# Patient Record
Sex: Female | Born: 1990 | Race: Black or African American | Hispanic: No | Marital: Single | Smoking: Former smoker
Health system: Southern US, Community
[De-identification: ages and names within clinical notes are randomized; demographics above are authoritative.]

## PROBLEM LIST (undated history)

## (undated) ENCOUNTER — Inpatient Hospital Stay (HOSPITAL_COMMUNITY): Payer: Self-pay

## (undated) ENCOUNTER — Emergency Department (HOSPITAL_COMMUNITY)
Admission: EM | Disposition: A | Discharge status: Other Acute Inpatient Hospital | Payer: No Typology Code available for payment source

## (undated) DIAGNOSIS — B999 Unspecified infectious disease: Secondary | ICD-10-CM

## (undated) DIAGNOSIS — Z8614 Personal history of Methicillin resistant Staphylococcus aureus infection: Secondary | ICD-10-CM

## (undated) DIAGNOSIS — F32A Depression, unspecified: Secondary | ICD-10-CM

## (undated) DIAGNOSIS — D573 Sickle-cell trait: Secondary | ICD-10-CM

## (undated) DIAGNOSIS — A749 Chlamydial infection, unspecified: Secondary | ICD-10-CM

## (undated) DIAGNOSIS — I10 Essential (primary) hypertension: Secondary | ICD-10-CM

## (undated) DIAGNOSIS — F329 Major depressive disorder, single episode, unspecified: Secondary | ICD-10-CM

## (undated) HISTORY — DX: Sickle-cell trait: D57.3

## (undated) HISTORY — DX: Personal history of Methicillin resistant Staphylococcus aureus infection: Z86.14

---
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
## 2010-08-30 ENCOUNTER — Emergency Department (HOSPITAL_COMMUNITY): Admission: EM | Admit: 2010-08-30 | Discharge: 2010-08-30 | Payer: Self-pay | Admitting: Emergency Medicine

## 2010-10-10 NOTE — L&D Delivery Note (Signed)
Delivery Note  Pt arrived via EMS with urge to push. I was called to [facility], when I arrived baby was delivered, ARNP was in room.   Infant had spontaneous respirations and was vigorous. There was light mec staining  At 10:07 AM a viable female was delivered via Vaginal, Spontaneous Delivery .  APGAR: 9, 9; weight .unkown at this time.  Placenta status: Intact, Spontaneous, Tomasa Blase, was mec stained, routine cord blood was collected.   Anesthesia: None  Episiotomy: none Lacerations: none Suture Repair: n/a Est. Blood Loss (mL):   Mom to postpartum.  Baby to nursery-stable.  Dr. Stefano Gaul notified  Sanda Klein M 07/14/2011, 12:08 PM

## 2010-12-04 ENCOUNTER — Inpatient Hospital Stay (HOSPITAL_COMMUNITY): Payer: Medicaid Other

## 2010-12-04 ENCOUNTER — Inpatient Hospital Stay (HOSPITAL_COMMUNITY)
Admission: AD | Admit: 2010-12-04 | Discharge: 2010-12-04 | Disposition: A | Discharge status: Home / Self Care | Payer: Medicaid Other | Source: Ambulatory Visit | Attending: Obstetrics & Gynecology | Admitting: Obstetrics & Gynecology

## 2010-12-04 DIAGNOSIS — R109 Unspecified abdominal pain: Secondary | ICD-10-CM | POA: Insufficient documentation

## 2010-12-04 DIAGNOSIS — O210 Mild hyperemesis gravidarum: Secondary | ICD-10-CM | POA: Insufficient documentation

## 2010-12-04 LAB — URINE MICROSCOPIC-ADD ON

## 2010-12-04 LAB — POCT PREGNANCY, URINE: Preg Test, Ur: POSITIVE

## 2010-12-04 LAB — URINALYSIS, ROUTINE W REFLEX MICROSCOPIC
Nitrite: NEGATIVE
Specific Gravity, Urine: 1.02 (ref 1.005–1.030)
pH: 6 (ref 5.0–8.0)

## 2010-12-04 LAB — CBC
MCH: 29.6 pg (ref 26.0–34.0)
MCHC: 34.8 g/dL (ref 30.0–36.0)
Platelets: 179 10*3/uL (ref 150–400)
RDW: 12.8 % (ref 11.5–15.5)

## 2010-12-04 LAB — HCG, QUANTITATIVE, PREGNANCY: hCG, Beta Chain, Quant, S: 42814 m[IU]/mL — ABNORMAL HIGH (ref <5)

## 2010-12-04 LAB — WET PREP, GENITAL

## 2010-12-06 LAB — GC/CHLAMYDIA PROBE AMP, GENITAL: GC Probe Amp, Genital: NEGATIVE

## 2010-12-06 LAB — URINE CULTURE: Culture  Setup Time: 201202260240

## 2010-12-26 LAB — CBC
Hemoglobin: 11.1 g/dL — ABNORMAL LOW (ref 12.0–15.0)
MCHC: 33.3 g/dL (ref 30.0–36.0)
MCHC: 33.7 g/dL (ref 30.0–36.0)
MCV: 90.2 fL (ref 78.0–100.0)
Platelets: 167 10*3/uL (ref 150–400)
Platelets: 184 10*3/uL (ref 150–400)
RDW: 13.1 % (ref 11.5–15.5)
RDW: 13.2 % (ref 11.5–15.5)

## 2010-12-26 LAB — COMPREHENSIVE METABOLIC PANEL
ALT: 13 U/L (ref 0–35)
Calcium: 8.9 mg/dL (ref 8.4–10.5)
Glucose, Bld: 92 mg/dL (ref 70–99)
Sodium: 134 mEq/L — ABNORMAL LOW (ref 135–145)
Total Protein: 6.7 g/dL (ref 6.0–8.3)

## 2010-12-26 LAB — URINALYSIS, ROUTINE W REFLEX MICROSCOPIC
Nitrite: NEGATIVE
Specific Gravity, Urine: 1.01 (ref 1.005–1.030)
Urobilinogen, UA: 0.2 mg/dL (ref 0.0–1.0)

## 2010-12-26 LAB — RPR: RPR Ser Ql: NONREACTIVE

## 2010-12-26 LAB — LACTATE DEHYDROGENASE: LDH: 112 U/L (ref 94–250)

## 2010-12-26 LAB — URINE MICROSCOPIC-ADD ON

## 2010-12-26 LAB — URIC ACID: Uric Acid, Serum: 5.3 mg/dL (ref 2.4–7.0)

## 2011-01-05 ENCOUNTER — Emergency Department (HOSPITAL_COMMUNITY)
Admission: EM | Admit: 2011-01-05 | Discharge: 2011-01-05 | Disposition: A | Discharge status: Home / Self Care | Payer: No Typology Code available for payment source | Attending: Emergency Medicine | Admitting: Emergency Medicine

## 2011-01-05 ENCOUNTER — Inpatient Hospital Stay (INDEPENDENT_AMBULATORY_CARE_PROVIDER_SITE_OTHER)
Admission: RE | Admit: 2011-01-05 | Discharge: 2011-01-05 | Disposition: A | Discharge status: Home / Self Care | Payer: Self-pay | Source: Ambulatory Visit | Attending: Emergency Medicine | Admitting: Emergency Medicine

## 2011-01-05 DIAGNOSIS — M549 Dorsalgia, unspecified: Secondary | ICD-10-CM | POA: Insufficient documentation

## 2011-01-05 DIAGNOSIS — M542 Cervicalgia: Secondary | ICD-10-CM | POA: Insufficient documentation

## 2011-01-05 DIAGNOSIS — Z331 Pregnant state, incidental: Secondary | ICD-10-CM

## 2011-01-05 DIAGNOSIS — R51 Headache: Secondary | ICD-10-CM | POA: Insufficient documentation

## 2011-01-05 DIAGNOSIS — R10819 Abdominal tenderness, unspecified site: Secondary | ICD-10-CM | POA: Insufficient documentation

## 2011-01-05 DIAGNOSIS — O99891 Other specified diseases and conditions complicating pregnancy: Secondary | ICD-10-CM | POA: Insufficient documentation

## 2011-01-05 DIAGNOSIS — R109 Unspecified abdominal pain: Secondary | ICD-10-CM | POA: Insufficient documentation

## 2011-01-05 LAB — POCT URINALYSIS DIP (DEVICE)
Bilirubin Urine: NEGATIVE
Glucose, UA: NEGATIVE mg/dL
Nitrite: NEGATIVE
Urobilinogen, UA: 1 mg/dL (ref 0.0–1.0)
pH: 7.5 (ref 5.0–8.0)

## 2011-01-14 LAB — URINALYSIS, ROUTINE W REFLEX MICROSCOPIC
Hgb urine dipstick: NEGATIVE
Ketones, ur: NEGATIVE mg/dL
Protein, ur: NEGATIVE mg/dL
Urobilinogen, UA: 0.2 mg/dL (ref 0.0–1.0)

## 2011-01-14 LAB — WET PREP, GENITAL: Yeast Wet Prep HPF POC: NONE SEEN

## 2011-01-14 LAB — GC/CHLAMYDIA PROBE AMP, GENITAL: GC Probe Amp, Genital: NEGATIVE

## 2011-01-14 LAB — FETAL FIBRONECTIN: Fetal Fibronectin: NEGATIVE

## 2011-01-16 LAB — POCT URINALYSIS DIP (DEVICE)
Glucose, UA: NEGATIVE mg/dL
Hgb urine dipstick: NEGATIVE
Nitrite: NEGATIVE
Protein, ur: NEGATIVE mg/dL
Specific Gravity, Urine: 1.02 (ref 1.005–1.030)
Urobilinogen, UA: 1 mg/dL (ref 0.0–1.0)
pH: 6.5 (ref 5.0–8.0)

## 2011-01-16 LAB — URINALYSIS, ROUTINE W REFLEX MICROSCOPIC
Bilirubin Urine: NEGATIVE
Glucose, UA: NEGATIVE mg/dL
Specific Gravity, Urine: 1.015 (ref 1.005–1.030)
Urobilinogen, UA: 1 mg/dL (ref 0.0–1.0)
pH: 6.5 (ref 5.0–8.0)

## 2011-01-16 LAB — URINE MICROSCOPIC-ADD ON

## 2011-01-16 LAB — URINE CULTURE

## 2011-01-16 LAB — GC/CHLAMYDIA PROBE AMP, GENITAL: GC Probe Amp, Genital: NEGATIVE

## 2011-01-16 LAB — WET PREP, GENITAL
Trich, Wet Prep: NONE SEEN
Yeast Wet Prep HPF POC: NONE SEEN

## 2011-01-17 LAB — URINALYSIS, ROUTINE W REFLEX MICROSCOPIC
Glucose, UA: NEGATIVE mg/dL
Hgb urine dipstick: NEGATIVE
Ketones, ur: NEGATIVE mg/dL
Leukocytes, UA: NEGATIVE
Protein, ur: 30 mg/dL — AB
pH: 7 (ref 5.0–8.0)

## 2011-01-17 LAB — WET PREP, GENITAL
Clue Cells Wet Prep HPF POC: NONE SEEN
Yeast Wet Prep HPF POC: NONE SEEN

## 2011-01-17 LAB — GC/CHLAMYDIA PROBE AMP, GENITAL
Chlamydia, DNA Probe: NEGATIVE
GC Probe Amp, Genital: NEGATIVE

## 2011-01-17 LAB — URINE MICROSCOPIC-ADD ON

## 2011-03-05 ENCOUNTER — Inpatient Hospital Stay (HOSPITAL_COMMUNITY)
Admission: AD | Admit: 2011-03-05 | Discharge: 2011-03-05 | Disposition: A | Discharge status: Home / Self Care | Payer: Medicaid Other | Source: Ambulatory Visit | Attending: Obstetrics and Gynecology | Admitting: Obstetrics and Gynecology

## 2011-03-05 DIAGNOSIS — R109 Unspecified abdominal pain: Secondary | ICD-10-CM | POA: Insufficient documentation

## 2011-03-05 DIAGNOSIS — O99891 Other specified diseases and conditions complicating pregnancy: Secondary | ICD-10-CM | POA: Insufficient documentation

## 2011-03-05 LAB — URINALYSIS, ROUTINE W REFLEX MICROSCOPIC
Glucose, UA: NEGATIVE mg/dL
Hgb urine dipstick: NEGATIVE
pH: 6.5 (ref 5.0–8.0)

## 2011-03-05 LAB — URINE MICROSCOPIC-ADD ON

## 2011-03-06 LAB — URINE CULTURE: Culture: NO GROWTH

## 2011-04-12 ENCOUNTER — Inpatient Hospital Stay (HOSPITAL_COMMUNITY)
Admission: AD | Admit: 2011-04-12 | Discharge: 2011-04-12 | Disposition: A | Discharge status: Home / Self Care | Payer: Medicaid Other | Source: Ambulatory Visit | Attending: Obstetrics and Gynecology | Admitting: Obstetrics and Gynecology

## 2011-04-12 DIAGNOSIS — O212 Late vomiting of pregnancy: Secondary | ICD-10-CM | POA: Insufficient documentation

## 2011-04-12 LAB — CBC
HCT: 32.4 % — ABNORMAL LOW (ref 36.0–46.0)
Hemoglobin: 11.1 g/dL — ABNORMAL LOW (ref 12.0–15.0)
MCH: 30.1 pg (ref 26.0–34.0)
MCV: 87.8 fL (ref 78.0–100.0)
RBC: 3.69 MIL/uL — ABNORMAL LOW (ref 3.87–5.11)

## 2011-04-12 LAB — COMPREHENSIVE METABOLIC PANEL
ALT: 16 U/L (ref 0–35)
BUN: 5 mg/dL — ABNORMAL LOW (ref 6–23)
CO2: 25 mEq/L (ref 19–32)
Calcium: 9.7 mg/dL (ref 8.4–10.5)
Creatinine, Ser: 0.65 mg/dL (ref 0.50–1.10)
GFR calc Af Amer: >60 mL/min (ref >60)
GFR calc non Af Amer: >60 mL/min (ref >60)
Glucose, Bld: 87 mg/dL (ref 70–99)

## 2011-04-25 ENCOUNTER — Encounter (HOSPITAL_COMMUNITY): Payer: Self-pay

## 2011-04-25 ENCOUNTER — Inpatient Hospital Stay (HOSPITAL_COMMUNITY)
Admission: AD | Admit: 2011-04-25 | Discharge: 2011-04-25 | Disposition: A | Discharge status: Home / Self Care | Payer: Medicaid Other | Source: Ambulatory Visit | Attending: Obstetrics and Gynecology | Admitting: Obstetrics and Gynecology

## 2011-04-25 DIAGNOSIS — R110 Nausea: Secondary | ICD-10-CM

## 2011-04-25 DIAGNOSIS — O210 Mild hyperemesis gravidarum: Secondary | ICD-10-CM | POA: Insufficient documentation

## 2011-04-25 HISTORY — DX: Unspecified infectious disease: B99.9

## 2011-04-25 HISTORY — DX: Depression, unspecified: F32.A

## 2011-04-25 HISTORY — DX: Major depressive disorder, single episode, unspecified: F32.9

## 2011-04-25 LAB — URINALYSIS, ROUTINE W REFLEX MICROSCOPIC
Bilirubin Urine: NEGATIVE
Glucose, UA: NEGATIVE mg/dL
Ketones, ur: 15 mg/dL — AB
Nitrite: NEGATIVE
Specific Gravity, Urine: 1.01 (ref 1.005–1.030)
pH: 6.5 (ref 5.0–8.0)

## 2011-04-25 LAB — URINE MICROSCOPIC-ADD ON

## 2011-04-25 MED ORDER — DEXTROSE IN LACTATED RINGERS 5 % IV SOLN
Freq: Once | INTRAVENOUS | Status: AC
  Administered 2011-04-25: 17:00:00 via INTRAVENOUS

## 2011-04-25 MED ORDER — ONDANSETRON HCL 4 MG/2ML IJ SOLN
4.0000 mg | Freq: Once | INTRAMUSCULAR | Status: AC
  Administered 2011-04-25: 4 mg via INTRAVENOUS
  Filled 2011-04-25: qty 2

## 2011-04-25 MED ORDER — ONDANSETRON HCL 4 MG/2ML IJ SOLN: 4.0000 mg | Freq: Once | INTRAMUSCULAR | Status: DC

## 2011-04-25 MED ORDER — LACTATED RINGERS IV SOLN
Freq: Once | INTRAVENOUS | Status: AC
  Administered 2011-04-25: 17:00:00 via INTRAVENOUS

## 2011-04-25 NOTE — ED Provider Notes (Signed)
Alexandria Fisher is a 20 y.o. female presenting for N/V States has had N/V off and on past 2 weeks. Stopped using Zofran d/t constipation. States Zofran was helping her nausea prior to discontinuing it.  No episodes of vomiting today. Desires MRSA culture today to determine if she is still pos  Maternal Medical History:  Reason for admission: Reason for admission: nausea (Nausea x past 2 weeks).  Fetal activity: Perceived fetal activity is normal.   Last perceived fetal movement was within the past hour.    Prenatal complications: No bleeding, cholelithiasis, HIV, hypertension, infection, IUGR, nephrolithiasis, oligohydramnios, placental abnormality, polyhydramnios, pre-eclampsia, preterm labor, substance abuse, thrombocytopenia or thrombophilia.   Prenatal Complications - Diabetes: none.   Hx MRSA OB History    Grav Para Term Preterm Abortions TAB SAB Ect Mult Living   2 1 1       1      Past Medical History  Diagnosis Date  . Infection   . Depression    Past Surgical History  Procedure Date  . No past surgeries    Family History: family history is not on file. Social History:  reports that she quit smoking about a year ago. She does not have any smokeless tobacco history on file. She reports that she does not drink alcohol or use illicit drugs.  Review of Systems  Constitutional: Negative.   HENT: Negative.   Eyes: Negative.   Respiratory: Negative.   Cardiovascular: Negative.   Gastrointestinal: Positive for nausea (Nausea x past 2 weeks) and abdominal pain (Sharp pains in abd. approx 2 x/hr lasting "a few seconds"). Negative for heartburn, vomiting, diarrhea, blood in stool and melena.  Genitourinary: Negative.   Musculoskeletal: Negative.   Skin: Negative.   Neurological: Negative.   Endo/Heme/Allergies: Negative.   Psychiatric/Behavioral: Negative.       Blood pressure 121/65, pulse 89, temperature 98.4 F (36.9 C), temperature source Oral, resp. rate 16, height  5\' 5"  (1.651 m), weight 93.895 kg (207 lb), SpO2 98.00%. Maternal Exam:  Abdomen: Fundal height is AGA.       Fetal Exam Fetal Monitor Review: Mode: ultrasound.   Baseline rate: 140.  Variability: moderate (6-25 bpm).   Pattern: accelerations present and no decelerations.    Fetal State Assessment: Category I - tracings are normal.     Physical Exam  Constitutional: She is oriented to person, place, and time. She appears well-developed and well-nourished. No distress.  HENT:  Head: Normocephalic.  GI: Soft. She exhibits no distension and no mass. There is no tenderness. There is no rebound and no guarding.  Musculoskeletal: Normal range of motion.  Neurological: She is alert and oriented to person, place, and time.  Skin: Skin is warm and dry.  Psychiatric: She has a normal mood and affect. Her behavior is normal. Judgment and thought content normal.    Prenatal labs: ABO, Rh:   Antibody:   Rubella:   RPR:    HBsAg:    HIV:    GBS:     Assessment/Plan: 1. Nausea  Plan: CCUA IV LR 1 L bolus Zofran 4 mg IVP   Anabel Halon 04/25/2011, 4:22 PM

## 2011-04-25 NOTE — Progress Notes (Signed)
Pt states she was seen last week in MAU for N/V/D and given IV fluids. Pt states she continues to have N/V, no diarrhea. Has been having low abdominal and R side of her back pain. No leaking or bleeding. Reports good fetal movement.

## 2011-04-25 NOTE — Progress Notes (Signed)
S: States feels much better and desires d/c home O: no vomiting noted, VSS FHR  140s Cat I, no decels Toco: No UC's, UI A: N/V now resolved P: D/C home F/U @ office

## 2011-04-25 NOTE — Progress Notes (Signed)
Pt c/o nausea that continues.  She says she stopped taking zofran b/c of contipation, although it worked .  She was encouraged to continue to take the zofran but add colace and miralax to her regimine daily.

## 2011-04-28 LAB — MRSA CULTURE

## 2011-07-07 LAB — CULTURE, ROUTINE-ABSCESS

## 2011-07-14 ENCOUNTER — Encounter (HOSPITAL_COMMUNITY): Payer: Self-pay | Admitting: *Deleted

## 2011-07-14 ENCOUNTER — Inpatient Hospital Stay (HOSPITAL_COMMUNITY)
Admission: AD | Admit: 2011-07-14 | Discharge: 2011-07-16 | DRG: 775 | Disposition: A | Discharge status: Home / Self Care | Payer: Medicaid Other | Source: Ambulatory Visit | Attending: Obstetrics and Gynecology | Admitting: Obstetrics and Gynecology

## 2011-07-14 DIAGNOSIS — Z8614 Personal history of Methicillin resistant Staphylococcus aureus infection: Secondary | ICD-10-CM | POA: Diagnosis present

## 2011-07-14 DIAGNOSIS — O99892 Other specified diseases and conditions complicating childbirth: Principal | ICD-10-CM | POA: Diagnosis present

## 2011-07-14 DIAGNOSIS — Z22330 Carrier of Group B streptococcus: Secondary | ICD-10-CM

## 2011-07-14 DIAGNOSIS — O9902 Anemia complicating childbirth: Secondary | ICD-10-CM | POA: Diagnosis present

## 2011-07-14 DIAGNOSIS — D573 Sickle-cell trait: Secondary | ICD-10-CM | POA: Diagnosis present

## 2011-07-14 HISTORY — DX: Personal history of Methicillin resistant Staphylococcus aureus infection: Z86.14

## 2011-07-14 MED ORDER — OXYCODONE-ACETAMINOPHEN 5-325 MG PO TABS
1.0000 | ORAL_TABLET | ORAL | Status: DC | PRN
  Administered 2011-07-14 – 2011-07-15 (×3): 1 via ORAL
  Filled 2011-07-14: qty 1

## 2011-07-14 MED ORDER — ONDANSETRON HCL 4 MG PO TABS: 4.0000 mg | ORAL_TABLET | ORAL | Status: DC | PRN

## 2011-07-14 MED ORDER — CITRIC ACID-SODIUM CITRATE 334-500 MG/5ML PO SOLN: 30.0000 mL | ORAL | Status: DC | PRN

## 2011-07-14 MED ORDER — BENZOCAINE-MENTHOL 20-0.5 % EX AERO
1.0000 "application " | INHALATION_SPRAY | CUTANEOUS | Status: DC | PRN
  Administered 2011-07-14: 1 via TOPICAL

## 2011-07-14 MED ORDER — ONDANSETRON HCL 4 MG/2ML IJ SOLN: 4.0000 mg | INTRAMUSCULAR | Status: DC | PRN

## 2011-07-14 MED ORDER — LANOLIN HYDROUS EX OINT: TOPICAL_OINTMENT | CUTANEOUS | Status: DC | PRN

## 2011-07-14 MED ORDER — ACETAMINOPHEN 325 MG PO TABS: 650.0000 mg | ORAL_TABLET | ORAL | Status: DC | PRN

## 2011-07-14 MED ORDER — BENZOCAINE-MENTHOL 20-0.5 % EX AERO
INHALATION_SPRAY | CUTANEOUS | Status: AC
  Filled 2011-07-14: qty 56

## 2011-07-14 MED ORDER — DIPHENHYDRAMINE HCL 25 MG PO CAPS: 25.0000 mg | ORAL_CAPSULE | Freq: Four times a day (QID) | ORAL | Status: DC | PRN

## 2011-07-14 MED ORDER — IBUPROFEN 600 MG PO TABS
600.0000 mg | ORAL_TABLET | Freq: Four times a day (QID) | ORAL | Status: DC
  Administered 2011-07-14 – 2011-07-16 (×6): 600 mg via ORAL
  Filled 2011-07-14 (×2): qty 1

## 2011-07-14 MED ORDER — SENNOSIDES-DOCUSATE SODIUM 8.6-50 MG PO TABS
2.0000 | ORAL_TABLET | Freq: Every day | ORAL | Status: DC
  Administered 2011-07-15: 2 via ORAL

## 2011-07-14 MED ORDER — PRENATAL PLUS 27-1 MG PO TABS
1.0000 | ORAL_TABLET | Freq: Every day | ORAL | Status: DC
  Administered 2011-07-14 – 2011-07-16 (×3): 1 via ORAL
  Filled 2011-07-14 (×3): qty 1

## 2011-07-14 MED ORDER — OXYCODONE-ACETAMINOPHEN 5-325 MG PO TABS
2.0000 | ORAL_TABLET | ORAL | Status: DC | PRN
  Filled 2011-07-14 (×2): qty 1

## 2011-07-14 MED ORDER — WITCH HAZEL-GLYCERIN EX PADS: 1.0000 "application " | MEDICATED_PAD | CUTANEOUS | Status: DC | PRN

## 2011-07-14 MED ORDER — DIBUCAINE 1 % RE OINT: 1.0000 "application " | TOPICAL_OINTMENT | RECTAL | Status: DC | PRN

## 2011-07-14 MED ORDER — TETANUS-DIPHTH-ACELL PERTUSSIS 5-2.5-18.5 LF-MCG/0.5 IM SUSP
0.5000 mL | Freq: Once | INTRAMUSCULAR | Status: AC
  Administered 2011-07-15: 0.5 mL via INTRAMUSCULAR
  Filled 2011-07-14: qty 0.5

## 2011-07-14 MED ORDER — MEASLES, MUMPS & RUBELLA VAC ~~LOC~~ INJ
0.5000 mL | INJECTION | Freq: Once | SUBCUTANEOUS | Status: DC
  Filled 2011-07-14: qty 0.5

## 2011-07-14 MED ORDER — IBUPROFEN 600 MG PO TABS
600.0000 mg | ORAL_TABLET | Freq: Four times a day (QID) | ORAL | Status: DC | PRN
  Administered 2011-07-15 – 2011-07-16 (×2): 600 mg via ORAL
  Filled 2011-07-14 (×6): qty 1

## 2011-07-14 MED ORDER — ZOLPIDEM TARTRATE 5 MG PO TABS: 5.0000 mg | ORAL_TABLET | Freq: Every evening | ORAL | Status: DC | PRN

## 2011-07-14 MED ORDER — SIMETHICONE 80 MG PO CHEW: 80.0000 mg | CHEWABLE_TABLET | ORAL | Status: DC | PRN

## 2011-07-14 MED ORDER — ONDANSETRON HCL 4 MG/2ML IJ SOLN: 4.0000 mg | Freq: Four times a day (QID) | INTRAMUSCULAR | Status: DC | PRN

## 2011-07-14 NOTE — Progress Notes (Signed)
BOW -SROM. Light meconium noted. Pt pushing. Marland Kitchen Spontaneous delivery of viable female assited by D.Coley,RN and K.Claudie Brickhouse,RN. Provider not at bedside.

## 2011-07-14 NOTE — Progress Notes (Signed)
Pt came in via EMS . Very uncomfortable stated hse started feling ctx around 4am. Presently feels the urge to push. Pt placed on  Bed. And Bag of wateres bulgding from vagina.  Midwife called.

## 2011-07-14 NOTE — H&P (Signed)
Alexandria Fisher is a 20 y.o. female presenting for urge to push, SROM and then precipitous SVD in MAU, ARNP was in room.  Pt began care at [facility] at 18wks. Initial EDC based on 6wk [place] was 10-22, Pt's LMP is 10/24/10. Which gives an EDC of 10/15. Pt was followed by the CNM's and MD's.  Pregnancy significant for: 1. Depression 2. LTC at 18wks - EDC based on 18wk [place] 3. 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 Closely spaced pregnancies 6. GERD 7. +GBS    Maternal Medical History:  Reason for admission: Reason for admission: contractions.  Contractions: Onset was 6-12 hours ago.   Frequency: regular.      OB History    Grav Para Term Preterm Abortions TAB SAB Ect Mult Living   2 1 1       1      Past Medical History  Diagnosis Date  . Infection   . Depression    Past Surgical History  Procedure Date  . No past surgeries    Family History: family history is not on file. CHTN in [facility] and [facility], Renal dx in [facility], Brst CA in [facility] Social History:  reports that she quit smoking about 14 months ago. She does not have any smokeless tobacco history on file. She reports that she does not drink alcohol or use illicit drugs.  Pt is a SBF, unemployed with 10th grade education and does not list FOB, she does not have religious preference. She did have some depression during this pregnancy and was started on zoloft 50mg . Has denied SI/HI  Review of Systems  All other systems reviewed and are negative.      Temperature 97.7 F (36.5 C), temperature source Oral, resp. rate 18. Exam Physical Exam  Nursing note and vitals reviewed. Constitutional: She is oriented to person, place, and time. She appears well-developed and well-nourished.  Cardiovascular: Normal rate.   Respiratory: Effort normal.  GI: Soft.  Genitourinary: Vagina normal.  Musculoskeletal: Normal range of motion.  Neurological: She is alert and oriented to person, place, and time.  Skin: Skin is warm and dry.  Psychiatric: She has a  normal mood and affect. Her behavior is normal. Judgment and thought content normal.    Prenatal labs: ABO, Rh:  Bpos Antibody:  neg Rubella:  Imm RPR:   NR HBsAg:   neg HIV:   neg GBS:   pos GC/CT - neg Initial H/H - 13.1/37.6 - plt 188 Quad - WNL +SCT 28wk labs 1hr gtt 86 RPR NR hgb - 11.4  Assessment/Plan: Term IUP Precipitous SVD - no complications +GBS - no prophylaxis given  Admit to mother/baby after recovery.  Routine PP orders  Dr Stefano Gaul notified  Sanda Klein M 07/14/2011, 10:41 AM

## 2011-07-15 LAB — CBC
MCH: 29.2 pg (ref 26.0–34.0)
MCHC: 34.3 g/dL (ref 30.0–36.0)
Platelets: 169 10*3/uL (ref 150–400)
RBC: 3.53 MIL/uL — ABNORMAL LOW (ref 3.87–5.11)
RDW: 12.4 % (ref 11.5–15.5)

## 2011-07-15 NOTE — Progress Notes (Signed)
Post Partum Day 1 Subjective: no complaints  Objective: Blood pressure 126/84, pulse 82, temperature 97.7 F (36.5 C), temperature source Oral, resp. rate 18, height 5\' 6"  (1.676 m), weight 98.431 kg (217 lb), unknown if currently breastfeeding.  Physical Exam:  General: alert Lochia: appropriate Uterine Fundus: firm Incision: Perineum intact DVT Evaluation: No evidence of DVT seen on physical exam. Negative Homan's sign.   Basename 07/15/11 0605  HGB 10.3*  HCT 30.0*    Assessment/Plan: Plan for discharge tomorrow Continue current care Plans Mirena   LOS: 1 day   Una Yeomans 07/15/2011, 8:31 AM

## 2011-07-15 NOTE — Progress Notes (Signed)
UR chart review completed.  

## 2011-07-16 MED ORDER — OXYCODONE-ACETAMINOPHEN 5-325 MG PO TABS: 1.0000 | ORAL_TABLET | ORAL | Status: AC | PRN

## 2011-07-16 MED ORDER — IBUPROFEN 600 MG PO TABS: 600.0000 mg | ORAL_TABLET | Freq: Four times a day (QID) | ORAL | Status: AC

## 2011-07-16 NOTE — Progress Notes (Signed)
Patient was referred for history of depression/anxiety. * Referral screened out by Clinical Social Worker because none of the following criteria appear to apply: ~ History of anxiety/depression during this pregnancy, or of post-partum depression. ~ Diagnosis of anxiety and/or depression within last 3 years ~ History of depression due to pregnancy loss/loss of child OR * Patient's symptoms currently being treated with medication and/or therapy. Please contact the Clinical Social Worker if needs arise, or by the patient's request.  Patient currently on Zoloft for medication management. Pt voices no concerns with depression this pregnancy.

## 2011-07-16 NOTE — Discharge Summary (Signed)
Obstetric Discharge Summary Reason for Admission: onset of labor Prenatal Procedures: ultrasound Intrapartum Procedures: spontaneous vaginal delivery Postpartum Procedures: none Complications-Operative and Postpartum: none Hemoglobin  Date Value Range Status  07/15/2011 10.3* 12.0-15.0 (g/dL) Final     HCT  Date Value Range Status  07/15/2011 30.0* 36.0-46.0 (%) Final    Discharge Diagnoses: Term Pregnancy-delivered  Discharge Information: Date: 07/16/2011 Activity: unrestricted Diet: routine Medications: PNV, Ibuprofen and Percocet Condition: stable Instructions: refer to practice specific booklet Discharge to: home Contraception:  Plans Mirena Follow-up Information    Follow up with [facility] in 4 weeks.         Newborn Data: Live born female  Birth Weight: 6 lb 14 oz (3118 g) APGAR: 9, 9  Home with mother.  Hopie Pellegrin O. 07/16/2011, 9:52 AM

## 2011-07-16 NOTE — Progress Notes (Signed)
Post Partum Day 2  Subjective: Pt without complaints.  Reports ambulating, voiding and tolerating po liquids and solids without difficulty.  Had BM this AM.  Denies weakness or dizziness.  Bottlefeeding.  Reports cramping is not relieved with motrin but has had relief with Percocet.     Objective: Blood pressure 104/66, pulse 103, temperature 98.2 F (36.8 C), temperature source Oral, resp. rate 18, height 5\' 6"  (1.676 m), weight 98.431 kg (217 lb), unknown if currently breastfeeding. Filed Vitals:   07/15/11 0607 07/15/11 1351 07/15/11 2217 07/16/11 0500  BP: 126/84 121/76 124/88 104/66  Pulse: 82 84 108 103  Temp: 97.7 F (36.5 C) 97.8 F (36.6 C) 98 F (36.7 C) 98.2 F (36.8 C)  TempSrc: Oral Oral Oral Oral  Resp: 18 20 20 18   Height:      Weight:       Physical Exam:  General: alert, cooperative and no distress Heart:  RRR Lungs:  Clear to a/p auscultation Abd:  Soft, non-tender with positive BS x 4 quads. Lochia: appropriate Uterine Fundus: firm, non-tender 1 below umbilicus Incision: N/A  DVT Evaluation: No evidence of DVT seen on physical exam. Negative Homan's sign bilaterally. No significant calf/ankle edema.   Basename 07/15/11 0605  HGB 10.3*  HCT 30.0*    Assessment/Plan: Stable s/p vaginal delivery  Discharge to home. Discharge instructions reviewed. Pt plans Mirena for contraception Rx: Motrin, Percocet and pt to resume PNVs   LOS: 2 days   Alexandria Fisher O. 07/16/2011, 9:43 AM

## 2011-07-25 LAB — POCT PREGNANCY, URINE
Operator id: 116391
Preg Test, Ur: NEGATIVE
Preg Test, Ur: NEGATIVE

## 2011-07-25 LAB — POCT URINALYSIS DIP (DEVICE)
Glucose, UA: 100 — AB
Glucose, UA: NEGATIVE
Ketones, ur: NEGATIVE
Nitrite: NEGATIVE
Operator id: 239701
Protein, ur: >=300 — AB
Specific Gravity, Urine: 1.015
Urobilinogen, UA: >=8
pH: 6
pH: 6.5

## 2011-07-25 LAB — URINE CULTURE: Colony Count: >=100000

## 2012-02-22 ENCOUNTER — Emergency Department (HOSPITAL_COMMUNITY)
Admission: EM | Admit: 2012-02-22 | Discharge: 2012-02-22 | Disposition: A | Discharge status: Home / Self Care | Payer: Medicaid Other | Source: Home / Self Care | Attending: Family Medicine | Admitting: Family Medicine

## 2012-02-22 ENCOUNTER — Encounter (HOSPITAL_COMMUNITY): Payer: Self-pay | Admitting: Emergency Medicine

## 2012-02-22 DIAGNOSIS — J039 Acute tonsillitis, unspecified: Secondary | ICD-10-CM

## 2012-02-22 MED ORDER — CETIRIZINE-PSEUDOEPHEDRINE ER 5-120 MG PO TB12: 1.0000 | ORAL_TABLET | Freq: Every day | ORAL | Status: DC

## 2012-02-22 MED ORDER — IBUPROFEN 600 MG PO TABS: 600.0000 mg | ORAL_TABLET | Freq: Three times a day (TID) | ORAL | Status: AC

## 2012-02-22 MED ORDER — AMOXICILLIN 500 MG PO CAPS: 500.0000 mg | ORAL_CAPSULE | Freq: Three times a day (TID) | ORAL | Status: AC

## 2012-02-22 NOTE — ED Notes (Signed)
C/o of sore throat and lt ear pain x 3 days

## 2012-02-22 NOTE — Discharge Instructions (Signed)
  Is very important top keep well hydrated. Take the prescribed medications as instructed. Take ibuprofen scheduled every 8 hours for the next 24-48 hours take with food and plenty of liquids as it can upset your stomach, can also alternate with Tylenol 500 mg every 6 hours as needed for pain or fever. Use nasal saline spray at least 3 times a day. (simply saline is over the counter) Return if difficulty breathing, worsening pain new onset of fever or not keeping fluids down.

## 2012-02-24 NOTE — ED Provider Notes (Signed)
History     CSN: [id number]  Arrival date & time 02/22/12  1550   First MD Initiated Contact with Patient 02/22/12 1620      Chief Complaint  Patient presents with  . Sore Throat    (Consider location/radiation/quality/duration/timing/severity/associated sxs/prior treatment) HPI Comments: 21 y/o female here c/o sore throat and left ear pain for 3 days. Pain with swallowing solids. But drinking fluids well. Denies cough or congestion. Subjective fever. No ear drainage. No difficulty breathing or chest pain. Has had body aches and back pain. No abdominal pain.    Past Medical History  Diagnosis Date  . Infection   . Depression     Past Surgical History  Procedure Date  . No past surgeries     No family history on file.  History  Substance Use Topics  . Smoking status: Former Smoker -- 0.5 packs/day    Quit date: 04/24/2010  . Smokeless tobacco: Not on file  . Alcohol Use: No    OB History    Grav Para Term Preterm Abortions TAB SAB Ect Mult Living   2 2 2       2       Review of Systems  Constitutional: Positive for fever, chills and appetite change.  HENT: Negative for congestion and rhinorrhea.   Respiratory: Negative for cough and shortness of breath.   Gastrointestinal: Negative for nausea, vomiting, abdominal pain and diarrhea.  Skin: Negative for rash.  Neurological: Positive for headaches. Negative for dizziness.    Allergies  Review of patient's allergies indicates no known allergies.  Home Medications   Current Outpatient Rx  Name Route Sig Dispense Refill  . AMOXICILLIN 500 MG PO CAPS Oral Take 1 capsule (500 mg total) by mouth 3 (three) times daily. 30 capsule 0  . CETIRIZINE-PSEUDOEPHEDRINE ER 5-120 MG PO TB12 Oral Take 1 tablet by mouth daily. 30 tablet 0  . IBUPROFEN 600 MG PO TABS Oral Take 1 tablet (600 mg total) by mouth 3 (three) times daily. 20 tablet 0  . PRENATAL PLUS 27-1 MG PO TABS Oral Take 1 tablet by mouth daily.      .  SERTRALINE HCL 50 MG PO TABS Oral Take 50 mg by mouth daily.        BP 106/69  Pulse 80  Temp(Src) 98.5 F (36.9 C) (Oral)  Resp 18  SpO2 100%  LMP 01/27/2012  Physical Exam  Nursing note and vitals reviewed. Constitutional: She is oriented to person, place, and time. She appears well-developed and well-nourished. No distress.  HENT:  Head: Normocephalic and atraumatic.       Nasal Congestion with erythema and swelling of nasal turbinates. Significant pharyngeal and tonsillar erythema with bilateral exudates. No uvula deviation. No trismus. TM's with increased vascular markings and some dullness bilaterally no swelling or bulging   Eyes: Conjunctivae are normal. Pupils are equal, round, and reactive to light. Right eye exhibits no discharge. Left eye exhibits no discharge.  Neck: Neck supple.  Cardiovascular: Normal rate, regular rhythm and normal heart sounds.   Pulmonary/Chest: Effort normal and breath sounds normal. No respiratory distress. She has no wheezes. She has no rales.  Abdominal: Soft. She exhibits no mass. There is no tenderness.  Lymphadenopathy:    She has no cervical adenopathy.  Neurological: She is alert and oriented to person, place, and time.  Skin: No rash noted.    ED Course  Procedures (including critical care time)  Labs Reviewed - No data to display No  results found.   1. Tonsillitis       MDM  Positive centor criteria. Treated with amoxicillin, ibuprofen and decongestant. Supportive recommendations provided.        Sharin Grave, MD 02/24/12 1447

## 2012-05-01 ENCOUNTER — Encounter: Payer: Self-pay | Admitting: Obstetrics and Gynecology

## 2012-05-01 ENCOUNTER — Ambulatory Visit (INDEPENDENT_AMBULATORY_CARE_PROVIDER_SITE_OTHER): Payer: Medicaid Other | Admitting: Obstetrics and Gynecology

## 2012-05-01 VITALS — BP 118/80 | HR 80 | Wt 201.0 lb

## 2012-05-01 DIAGNOSIS — N898 Other specified noninflammatory disorders of vagina: Secondary | ICD-10-CM

## 2012-05-01 DIAGNOSIS — N949 Unspecified condition associated with female genital organs and menstrual cycle: Secondary | ICD-10-CM

## 2012-05-01 DIAGNOSIS — Z30432 Encounter for removal of intrauterine contraceptive device: Secondary | ICD-10-CM

## 2012-05-01 DIAGNOSIS — Z975 Presence of (intrauterine) contraceptive device: Secondary | ICD-10-CM

## 2012-05-01 DIAGNOSIS — IMO0001 Reserved for inherently not codable concepts without codable children: Secondary | ICD-10-CM

## 2012-05-01 DIAGNOSIS — Z113 Encounter for screening for infections with a predominantly sexual mode of transmission: Secondary | ICD-10-CM

## 2012-05-01 DIAGNOSIS — R102 Pelvic and perineal pain: Secondary | ICD-10-CM

## 2012-05-01 LAB — POCT URINALYSIS DIPSTICK
Bilirubin, UA: NEGATIVE
Blood, UA: NEGATIVE
Glucose, UA: NEGATIVE
Ketones, UA: NEGATIVE
Spec Grav, UA: 1.01

## 2012-05-01 LAB — POCT WET PREP (WET MOUNT): pH: 5.5

## 2012-05-01 MED ORDER — TINIDAZOLE 500 MG PO TABS: 2.0000 g | ORAL_TABLET | Freq: Every day | ORAL | Status: AC

## 2012-05-01 NOTE — Progress Notes (Signed)
Color: BROWN Odor: yes Itching:yes Thin:no Thick:yes Fever:no Dyspareunia:no Hx PID:no HX STD:no Pelvic Pain:yes OFF AND ON WITH LOWER BACK PAIN Desires Gc/CT:yes Desires HIV,RPR,HbsAG:yes

## 2012-05-01 NOTE — Progress Notes (Addendum)
21 YO wants to have IUD removed but does not want a replacement contraceptive.  Patient also complains of a vaginal discharge with mild itching and wants STD testing.  She goes on to say that she's had vague discomfort in abdomen for several weeks but there are no known aggravating or alleviating factors.  Denies fever, urinary tract symptoms, dyspareunia or back pain   O: Abdomen: soft, non-tender     Pelvic: EGBUS-wnl, vagina-normal rugae, cervix-string visible, IUD removed without difficulty, uterus normal size without tenderness, adnexae-no tenderness     or masses  Wet Prep:  pH 5.5,  whiff-negative, clue + U/A-negative UPT-negative   A: Bacterial Vaginosis     IUD Removal     Pelvic Discomfort   P: STD testing       Notify office if pelvic discomfort persists      Handout given on methods of contraception      Tinidazole 500 mg #8  4 po qd x 2 day       RTO-as scheduled  Terreon Ekholm, PA-C

## 2012-05-02 LAB — RPR

## 2012-05-02 LAB — HSV 2 ANTIBODY, IGG: HSV 2 Glycoprotein G Ab, IgG: 3.26 IV — ABNORMAL HIGH

## 2012-05-02 LAB — GC/CHLAMYDIA PROBE AMP, GENITAL: GC Probe Amp, Genital: NEGATIVE

## 2012-05-02 LAB — HIV ANTIBODY (ROUTINE TESTING W REFLEX): HIV: NONREACTIVE

## 2012-08-24 ENCOUNTER — Encounter: Payer: Self-pay | Admitting: Obstetrics and Gynecology

## 2012-11-14 ENCOUNTER — Other Ambulatory Visit (HOSPITAL_COMMUNITY)
Admission: RE | Admit: 2012-11-14 | Discharge: 2012-11-14 | Disposition: A | Discharge status: Home / Self Care | Payer: Self-pay | Source: Ambulatory Visit | Attending: Family Medicine | Admitting: Family Medicine

## 2012-11-14 ENCOUNTER — Emergency Department (INDEPENDENT_AMBULATORY_CARE_PROVIDER_SITE_OTHER)
Admission: EM | Admit: 2012-11-14 | Discharge: 2012-11-14 | Disposition: A | Discharge status: Home / Self Care | Payer: Self-pay | Source: Home / Self Care | Attending: Family Medicine | Admitting: Family Medicine

## 2012-11-14 ENCOUNTER — Encounter (HOSPITAL_COMMUNITY): Payer: Self-pay | Admitting: Emergency Medicine

## 2012-11-14 DIAGNOSIS — N760 Acute vaginitis: Secondary | ICD-10-CM

## 2012-11-14 DIAGNOSIS — Z113 Encounter for screening for infections with a predominantly sexual mode of transmission: Secondary | ICD-10-CM | POA: Insufficient documentation

## 2012-11-14 LAB — POCT URINALYSIS DIP (DEVICE)
Bilirubin Urine: NEGATIVE
Glucose, UA: NEGATIVE mg/dL
Hgb urine dipstick: NEGATIVE
Ketones, ur: NEGATIVE mg/dL
pH: 6 (ref 5.0–8.0)

## 2012-11-14 MED ORDER — FLUCONAZOLE 150 MG PO TABS: ORAL_TABLET | ORAL | Status: DC

## 2012-11-14 MED ORDER — METRONIDAZOLE 500 MG PO TABS: 500.0000 mg | ORAL_TABLET | Freq: Two times a day (BID) | ORAL | Status: DC

## 2012-11-14 NOTE — ED Provider Notes (Signed)
History     CSN: [id number]  Arrival date & time 11/14/12  1157   First MD Initiated Contact with Patient 11/14/12 1257      Chief Complaint  Patient presents with  . Vaginal Discharge    (Consider location/radiation/quality/duration/timing/severity/associated sxs/prior treatment) HPI Comments: 22 year old smoker female here complaining of vaginal discharge for 2 weeks. Symptoms associated with vaginal itchiness and  burning on urination urinary frequency for 2 weeks. Patient also reports low back pain for over a month. She reports history of chronic low back pain. Denies fever or chills. No pelvic or abdominal pain, nausea vomiting or diarrhea. Patient reports she has a history of recurrent bacterial vaginosis and urinary tract infections. Denies prior history of sexual transmitted diseases. Patient uses condoms for contraception. Last menstrual period last month on the 25th states she had a normal period. She had a negative test results for GC, chlamydia, HIV, RPR, hepatitis B and hepatitis C testing in July 2013. Her wet prep was positive for clue cells only at that time.    Past Medical History  Diagnosis Date  . Infection   . Depression     Past Surgical History  Procedure Date  . No past surgeries     Family History  Problem Relation Age of Onset  . Breast cancer Maternal Grandmother     History  Substance Use Topics  . Smoking status: Former Smoker -- 0.5 packs/day    Quit date: 04/24/2010  . Smokeless tobacco: Not on file  . Alcohol Use: No    OB History    Grav Para Term Preterm Abortions TAB SAB Ect Mult Living   2 2 2       2       Review of Systems  Constitutional: Negative for fever, chills and appetite change.  Gastrointestinal: Negative for nausea, vomiting and abdominal pain.  Genitourinary: Positive for dysuria and vaginal discharge. Negative for frequency, hematuria, flank pain, vaginal bleeding and pelvic pain.  Musculoskeletal: Positive for  back pain.  Skin: Negative for rash.  Neurological: Negative for dizziness and headaches.    Allergies  Review of patient's allergies indicates no known allergies.  Home Medications   Current Outpatient Rx  Name  Route  Sig  Dispense  Refill  . CETIRIZINE-PSEUDOEPHEDRINE ER 5-120 MG PO TB12   Oral   Take 1 tablet by mouth daily.   30 tablet   0   . FLUCONAZOLE 150 MG PO TABS      1 tab by mouth q. 72 hours x4   4 tablet   0   . METRONIDAZOLE 500 MG PO TABS   Oral   Take 1 tablet (500 mg total) by mouth 2 (two) times daily.   14 tablet   0   . PRENATAL PLUS 27-1 MG PO TABS   Oral   Take 1 tablet by mouth daily.           . SERTRALINE HCL 50 MG PO TABS   Oral   Take 50 mg by mouth daily.             BP 130/77  Pulse 93  Temp 97.7 F (36.5 C) (Oral)  Resp 16  SpO2 100%  LMP 11/03/2012  Breastfeeding? No  Physical Exam  Nursing note and vitals reviewed. Constitutional: She is oriented to person, place, and time. She appears well-developed and well-nourished. No distress.  HENT:  Head: Normocephalic and atraumatic.  Eyes: No scleral icterus.  Cardiovascular: Normal heart sounds.  Pulmonary/Chest: Breath sounds normal.  Abdominal: Soft. There is no tenderness.  Genitourinary: Uterus normal. There is no lesion on the right labia. There is no lesion on the left labia. Cervix exhibits no motion tenderness. Right adnexum displays no mass, no tenderness and no fullness. Left adnexum displays no mass, no tenderness and no fullness. There is erythema around the vagina. No bleeding around the vagina. Vaginal discharge found.    Lymphadenopathy:    She has no cervical adenopathy.       Right: No inguinal adenopathy present.       Left: No inguinal adenopathy present.  Neurological: She is alert and oriented to person, place, and time.  Skin: No rash noted. She is not diaphoretic.    ED Course  Procedures (including critical care time)  Labs Reviewed    POCT URINALYSIS DIP (DEVICE) - Abnormal; Notable for the following:    Urobilinogen, UA 2.0 (*)     Leukocytes, UA SMALL (*)  Biochemical Testing Only. Please order routine urinalysis from main lab if confirmatory testing is needed.   All other components within normal limits  POCT PREGNANCY, URINE  CERVICOVAGINAL ANCILLARY ONLY   No results found.   1. Vaginitis       MDM  22 year old female here with vaginal discharge and dysuria for 2 weeks. Negative pregnancy test Impress urinary symptoms related to vaginitis. Treat empirically with Flagyl and Diflucan. Wet prep, GC and Chlamydia tests results pending at the time of discharge. Supportive care and red flags that should prompt her return to medical attention discussed with patient and provided in writing.        Sharin Grave, MD 11/15/12 1712

## 2012-11-14 NOTE — ED Notes (Signed)
Pt is here for vag discharge x2 weeks Sx include: lower back pain x1 month, grayish discharge, dysuria, vag itching Denies: abd pain, f/v/n/d Hx of BV and UTI's  She is alert w/no signs of acute distress.

## 2012-11-16 NOTE — ED Notes (Signed)
GC/Chlamydia neg.,  Affirm: Gardnerella pos., Candida and Trich neg.  Pt. adequately treated with Flagyl. Vassie Moselle 11/16/2012

## 2013-01-23 ENCOUNTER — Emergency Department (HOSPITAL_COMMUNITY)
Admission: EM | Admit: 2013-01-23 | Discharge: 2013-01-23 | Disposition: A | Discharge status: Home / Self Care | Payer: No Typology Code available for payment source | Attending: Emergency Medicine | Admitting: Emergency Medicine

## 2013-01-23 ENCOUNTER — Encounter (HOSPITAL_COMMUNITY): Payer: Self-pay | Admitting: Emergency Medicine

## 2013-01-23 DIAGNOSIS — S7011XA Contusion of right thigh, initial encounter: Secondary | ICD-10-CM

## 2013-01-23 DIAGNOSIS — Y92410 Unspecified street and highway as the place of occurrence of the external cause: Secondary | ICD-10-CM | POA: Insufficient documentation

## 2013-01-23 DIAGNOSIS — S7010XA Contusion of unspecified thigh, initial encounter: Secondary | ICD-10-CM | POA: Insufficient documentation

## 2013-01-23 DIAGNOSIS — S40029A Contusion of unspecified upper arm, initial encounter: Secondary | ICD-10-CM | POA: Insufficient documentation

## 2013-01-23 DIAGNOSIS — S8010XA Contusion of unspecified lower leg, initial encounter: Secondary | ICD-10-CM | POA: Insufficient documentation

## 2013-01-23 DIAGNOSIS — F3289 Other specified depressive episodes: Secondary | ICD-10-CM | POA: Insufficient documentation

## 2013-01-23 DIAGNOSIS — M25519 Pain in unspecified shoulder: Secondary | ICD-10-CM | POA: Insufficient documentation

## 2013-01-23 DIAGNOSIS — Y9389 Activity, other specified: Secondary | ICD-10-CM | POA: Insufficient documentation

## 2013-01-23 DIAGNOSIS — S8012XA Contusion of left lower leg, initial encounter: Secondary | ICD-10-CM

## 2013-01-23 DIAGNOSIS — Z87891 Personal history of nicotine dependence: Secondary | ICD-10-CM | POA: Insufficient documentation

## 2013-01-23 DIAGNOSIS — S40021A Contusion of right upper arm, initial encounter: Secondary | ICD-10-CM

## 2013-01-23 DIAGNOSIS — M549 Dorsalgia, unspecified: Secondary | ICD-10-CM | POA: Insufficient documentation

## 2013-01-23 DIAGNOSIS — R51 Headache: Secondary | ICD-10-CM | POA: Insufficient documentation

## 2013-01-23 DIAGNOSIS — F329 Major depressive disorder, single episode, unspecified: Secondary | ICD-10-CM | POA: Insufficient documentation

## 2013-01-23 MED ORDER — IBUPROFEN 600 MG PO TABS: 600.0000 mg | ORAL_TABLET | Freq: Four times a day (QID) | ORAL | Status: DC | PRN

## 2013-01-23 MED ORDER — IBUPROFEN 800 MG PO TABS
800.0000 mg | ORAL_TABLET | Freq: Once | ORAL | Status: AC
  Administered 2013-01-23: 800 mg via ORAL
  Filled 2013-01-23: qty 1

## 2013-01-23 MED ORDER — CYCLOBENZAPRINE HCL 10 MG PO TABS: 10.0000 mg | ORAL_TABLET | Freq: Two times a day (BID) | ORAL | Status: DC | PRN
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
## 2013-01-25 NOTE — ED Provider Notes (Signed)
Medical screening examination/treatment/procedure(s) were performed by non-physician practitioner and as supervising physician I was immediately available for consultation/collaboration.   Maurisha Mongeau M Corbin Falck, MD 01/25/13 1909 

## 2013-02-06 ENCOUNTER — Encounter (HOSPITAL_COMMUNITY): Payer: Self-pay | Admitting: Emergency Medicine

## 2013-02-06 ENCOUNTER — Emergency Department (HOSPITAL_COMMUNITY)
Admission: EM | Admit: 2013-02-06 | Discharge: 2013-02-06 | Disposition: A | Discharge status: Home / Self Care | Payer: Self-pay | Attending: Emergency Medicine | Admitting: Emergency Medicine

## 2013-02-06 DIAGNOSIS — Y939 Activity, unspecified: Secondary | ICD-10-CM | POA: Insufficient documentation

## 2013-02-06 DIAGNOSIS — S29012D Strain of muscle and tendon of back wall of thorax, subsequent encounter: Secondary | ICD-10-CM

## 2013-02-06 DIAGNOSIS — Y929 Unspecified place or not applicable: Secondary | ICD-10-CM | POA: Insufficient documentation

## 2013-02-06 DIAGNOSIS — M79604 Pain in right leg: Secondary | ICD-10-CM

## 2013-02-06 DIAGNOSIS — IMO0002 Reserved for concepts with insufficient information to code with codable children: Secondary | ICD-10-CM | POA: Insufficient documentation

## 2013-02-06 DIAGNOSIS — IMO0001 Reserved for inherently not codable concepts without codable children: Secondary | ICD-10-CM | POA: Insufficient documentation

## 2013-02-06 MED ORDER — ACETAMINOPHEN 500 MG PO TABS: 500.0000 mg | ORAL_TABLET | Freq: Four times a day (QID) | ORAL | Status: DC | PRN
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
## 2013-02-06 NOTE — ED Notes (Signed)
Voiced understanding of instructions given 
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
## 2013-02-08 NOTE — ED Provider Notes (Signed)
Medical screening examination/treatment/procedure(s) were performed by non-physician practitioner and as supervising physician I was immediately available for consultation/collaboration.    Celene Kras, MD 02/08/13 [phone]

## 2013-05-07 ENCOUNTER — Encounter (HOSPITAL_COMMUNITY): Payer: Self-pay

## 2013-05-07 ENCOUNTER — Inpatient Hospital Stay (HOSPITAL_COMMUNITY)
Admission: AD | Admit: 2013-05-07 | Discharge: 2013-05-07 | Disposition: A | Discharge status: Home / Self Care | Payer: Self-pay | Source: Ambulatory Visit | Attending: Obstetrics and Gynecology | Admitting: Obstetrics and Gynecology

## 2013-05-07 DIAGNOSIS — R1012 Left upper quadrant pain: Secondary | ICD-10-CM | POA: Insufficient documentation

## 2013-05-07 DIAGNOSIS — Z3200 Encounter for pregnancy test, result unknown: Secondary | ICD-10-CM

## 2013-05-07 DIAGNOSIS — Z3201 Encounter for pregnancy test, result positive: Secondary | ICD-10-CM | POA: Insufficient documentation

## 2013-05-07 LAB — URINALYSIS, ROUTINE W REFLEX MICROSCOPIC
Glucose, UA: NEGATIVE mg/dL
Hgb urine dipstick: NEGATIVE
Specific Gravity, Urine: 1.01 (ref 1.005–1.030)
Urobilinogen, UA: 0.2 mg/dL (ref 0.0–1.0)

## 2013-05-07 LAB — URINE MICROSCOPIC-ADD ON

## 2013-05-07 LAB — POCT PREGNANCY, URINE: Preg Test, Ur: POSITIVE — AB

## 2013-05-07 MED ORDER — GI COCKTAIL ~~LOC~~
30.0000 mL | Freq: Once | ORAL | Status: AC
  Administered 2013-05-07: 30 mL via ORAL
  Filled 2013-05-07: qty 30

## 2013-05-07 MED ORDER — FAMOTIDINE 10 MG PO CHEW: 10.0000 mg | CHEWABLE_TABLET | Freq: Two times a day (BID) | ORAL | Status: DC

## 2013-05-07 NOTE — MAU Note (Signed)
Pt called to come back to a room and she is not in the lob[place]

## 2013-05-07 NOTE — MAU Provider Note (Signed)
History     CSN: [id number]  Arrival date and time: 05/07/13 1708   First Provider Initiated Contact with Patient 05/07/13 2028      Chief Complaint  Patient presents with  . Possible Pregnancy  . Abdominal Pain   Possible Pregnancy Associated symptoms include abdominal pain.  Abdominal Pain    Alexandria Fisher is a 22 y.o. [id number] at [redacted]w[redacted]d who presents today for pregnancy verification. She states she took a test at home, but wanted to be sure. She also needs a letter. She is also having LUQ pain. She denies lower abdominal pain or any menstrual like cramping, and she denies any bleeding.   Past Medical History  Diagnosis Date  . Infection   . Depression     Past Surgical History  Procedure Laterality Date  . No past surgeries      Family History  Problem Relation Age of Onset  . Breast cancer Maternal Grandmother     History  Substance Use Topics  . Smoking status: Current Every Day Smoker -- 0.50 packs/day    Types: Cigarettes    Last Attempt to Quit: 04/24/2010  . Smokeless tobacco: Not on file  . Alcohol Use: No    Allergies: No Known Allergies  No prescriptions prior to admission    Review of Systems  Gastrointestinal: Positive for abdominal pain.   Physical Exam   Blood pressure 127/78, pulse 100, temperature 98 F (36.7 C), temperature source Oral, resp. rate 16, height 5\' 6"  (1.676 m), weight 99.519 kg (219 lb 6.4 oz), last menstrual period 03/24/2013, SpO2 100.00%.  Physical Exam  Nursing note and vitals reviewed. Constitutional: She is oriented to person, place, and time. She appears well-developed and well-nourished. No distress.  Cardiovascular: Normal rate.   Respiratory: Effort normal.  GI: Soft. There is no tenderness.  Neurological: She is alert and oriented to person, place, and time.  Skin: Skin is warm and dry.  Psychiatric: She has a normal mood and affect.    MAU Course  Procedures  Results for orders placed during the  hospital encounter of 05/07/13 (from the past 24 hour(s))  URINALYSIS, ROUTINE W REFLEX MICROSCOPIC     Status: Abnormal   Collection Time    05/07/13  5:30 PM      Result Value Range   Color, Urine YELLOW  YELLOW   APPearance HAZY (*) CLEAR   Specific Gravity, Urine 1.010  1.005 - 1.030   pH 6.0  5.0 - 8.0   Glucose, UA NEGATIVE  NEGATIVE mg/dL   Hgb urine dipstick NEGATIVE  NEGATIVE   Bilirubin Urine NEGATIVE  NEGATIVE   Ketones, ur NEGATIVE  NEGATIVE mg/dL   Protein, ur NEGATIVE  NEGATIVE mg/dL   Urobilinogen, UA 0.2  0.0 - 1.0 mg/dL   Nitrite NEGATIVE  NEGATIVE   Leukocytes, UA SMALL (*) NEGATIVE  URINE MICROSCOPIC-ADD ON     Status: Abnormal   Collection Time    05/07/13  5:30 PM      Result Value Range   Squamous Epithelial / LPF MANY (*) RARE   WBC, UA 3-6  <3 WBC/hpf  POCT PREGNANCY, URINE     Status: Abnormal   Collection Time    05/07/13  5:38 PM      Result Value Range   Preg Test, Ur POSITIVE (*) NEGATIVE     Assessment and Plan   1. Encounter for confirmation of pregnancy test result with physical examination    RX: pepcid BID Start  [facility] as soon as possible Verification letter given First trimester danger signs reviewed Return to MAU as needed  Tawnya Crook 05/07/2013, 8:31 PM

## 2013-05-07 NOTE — MAU Note (Signed)
Patient states she had a home pregnancy test yesterday. Has been having left mid to upper abdominal pain for about one week. Denies bleeding, discharge, nausea or vomiting.

## 2013-05-07 NOTE — MAU Note (Signed)
Patient is in to confirm pregnancy, get a verification letter and she c/o luq pain (cramping). She denies vaginal bleeding , abnormal vaginal.

## 2013-05-13 NOTE — MAU Provider Note (Signed)
Attestation of Attending Supervision of Advanced Practitioner (CNM/NP): Evaluation and management procedures were performed by the Advanced Practitioner under my supervision and collaboration.  I have reviewed the Advanced Practitioner's note and chart, and I agree with the management and plan.  Tyashia Morrisette 05/13/2013 2:21 PM   

## 2013-05-15 ENCOUNTER — Inpatient Hospital Stay (HOSPITAL_COMMUNITY)
Admission: AD | Admit: 2013-05-15 | Discharge: 2013-05-15 | Disposition: A | Discharge status: Home / Self Care | Payer: Self-pay | Source: Ambulatory Visit | Attending: Obstetrics & Gynecology | Admitting: Obstetrics & Gynecology

## 2013-05-15 ENCOUNTER — Inpatient Hospital Stay (HOSPITAL_COMMUNITY): Payer: Self-pay

## 2013-05-15 DIAGNOSIS — O200 Threatened abortion: Secondary | ICD-10-CM | POA: Insufficient documentation

## 2013-05-15 DIAGNOSIS — R51 Headache: Secondary | ICD-10-CM | POA: Insufficient documentation

## 2013-05-15 DIAGNOSIS — R109 Unspecified abdominal pain: Secondary | ICD-10-CM | POA: Insufficient documentation

## 2013-05-15 LAB — WET PREP, GENITAL
Clue Cells Wet Prep HPF POC: NONE SEEN
Trich, Wet Prep: NONE SEEN

## 2013-05-15 LAB — CBC
MCH: 29.8 pg (ref 26.0–34.0)
MCV: 85.2 fL (ref 78.0–100.0)
Platelets: 209 10*3/uL (ref 150–400)
RBC: 4.6 MIL/uL (ref 3.87–5.11)
RDW: 12.3 % (ref 11.5–15.5)
WBC: 11.4 10*3/uL — ABNORMAL HIGH (ref 4.0–10.5)

## 2013-05-15 LAB — HCG, QUANTITATIVE, PREGNANCY: hCG, Beta Chain, Quant, S: 2198 m[IU]/mL — ABNORMAL HIGH (ref <5)

## 2013-05-15 NOTE — MAU Note (Signed)
Started bleeding yesterday, was light- notices when wipes, has seen little clots.  Has been cramping in lower abd,  Having headaches- tylenol does help.

## 2013-05-15 NOTE — MAU Provider Note (Signed)
History     CSN: [id number]  Arrival date and time: 05/15/13 1318   None     Chief Complaint  Patient presents with  . Vaginal Bleeding  . Abdominal Pain  . Headache   Vaginal Bleeding Associated symptoms include abdominal pain and headaches.  Abdominal Pain Associated symptoms include headaches.  Headache  Associated symptoms include abdominal pain.    Alexandria Fisher is a 22 y.o. [id number] at [redacted]w[redacted]d who presents today with vaginal bleeding. She states that it started about 2 days ago and is spotty and red in nature. She has had some cramping, but states it is "not too bad". She is planning on starting [facility] once her medicaid is active.   Past Medical History  Diagnosis Date  . Infection   . Depression     Past Surgical History  Procedure Laterality Date  . No past surgeries      Family History  Problem Relation Age of Onset  . Breast cancer Maternal Grandmother     History  Substance Use Topics  . Smoking status: Current Every Day Smoker -- 0.50 packs/day    Types: Cigarettes    Last Attempt to Quit: 04/24/2010  . Smokeless tobacco: Not on file  . Alcohol Use: No    Allergies: No Known Allergies  Prescriptions prior to admission  Medication Sig Dispense Refill  . famotidine (PEPCID AC) 10 MG chewable tablet Chew 1 tablet (10 mg total) by mouth 2 (two) times daily.  60 tablet  0    Review of Systems  Gastrointestinal: Positive for abdominal pain.  Genitourinary: Positive for vaginal bleeding.  Neurological: Positive for headaches.   Physical Exam   Blood pressure 137/89, pulse 99, temperature 98.2 F (36.8 C), temperature source Oral, resp. rate 18, weight 99.338 kg (219 lb), last menstrual period 03/24/2013.  Physical Exam  Nursing note and vitals reviewed. Constitutional: She is oriented to person, place, and time. She appears well-developed and well-nourished. No distress.  Cardiovascular: Normal rate.   Respiratory: Effort normal.  GI:  Soft. There is no tenderness.  Genitourinary:   .External: no lesion Vagina: small amount of blood seen Cervix: pink, smooth, no CMT, closed  Uterus: slightly enlarged    Neurological: She is alert and oriented to person, place, and time.  Skin: Skin is warm and dry.  Psychiatric: She has a normal mood and affect.    MAU Course  Procedures  Results for orders placed during the hospital encounter of 05/15/13 (from the past 24 hour(s))  CBC     Status: Abnormal   Collection Time    05/15/13  1:23 PM      Result Value Range   WBC 11.4 (*) 4.0 - 10.5 K/uL   RBC 4.60  3.87 - 5.11 MIL/uL   Hemoglobin 13.7  12.0 - 15.0 g/dL   HCT 57.8  46.9 - 62.9 %   MCV 85.2  78.0 - 100.0 fL   MCH 29.8  26.0 - 34.0 pg   MCHC 34.9  30.0 - 36.0 g/dL   RDW 52.8  41.3 - 24.4 %   Platelets 209  150 - 400 K/uL  HCG, QUANTITATIVE, PREGNANCY     Status: Abnormal   Collection Time    05/15/13  1:23 PM      Result Value Range   hCG, Beta Chain, Quant, S 2198 (*) <5 mIU/mL  WET PREP, GENITAL     Status: Abnormal   Collection Time    05/15/13  4:25 PM      Result Value Range   Yeast Wet Prep HPF POC NONE SEEN  NONE SEEN   Trich, Wet Prep NONE SEEN  NONE SEEN   Clue Cells Wet Prep HPF POC NONE SEEN  NONE SEEN   WBC, Wet Prep HPF POC FEW (*) NONE SEEN     Assessment and Plan   1. Threatened abortion in first trimester    Repeat ultrasound in 10 days Bleeding precautions given Start [facility] as soon as possible.   Tawnya Crook 05/15/2013, 4:27 PM

## 2013-05-16 ENCOUNTER — Inpatient Hospital Stay (HOSPITAL_COMMUNITY)
Admission: AD | Admit: 2013-05-16 | Discharge: 2013-05-16 | Disposition: A | Discharge status: Home / Self Care | Payer: Self-pay | Source: Ambulatory Visit | Attending: Obstetrics & Gynecology | Admitting: Obstetrics & Gynecology

## 2013-05-16 ENCOUNTER — Encounter (HOSPITAL_COMMUNITY): Payer: Self-pay | Admitting: *Deleted

## 2013-05-16 ENCOUNTER — Telehealth: Payer: Self-pay | Admitting: Advanced Practice Midwife

## 2013-05-16 DIAGNOSIS — R109 Unspecified abdominal pain: Secondary | ICD-10-CM | POA: Insufficient documentation

## 2013-05-16 DIAGNOSIS — O200 Threatened abortion: Secondary | ICD-10-CM | POA: Insufficient documentation

## 2013-05-16 DIAGNOSIS — O020 Blighted ovum and nonhydatidiform mole: Secondary | ICD-10-CM

## 2013-05-16 DIAGNOSIS — O0289 Other abnormal products of conception: Secondary | ICD-10-CM

## 2013-05-16 LAB — CBC
HCT: 38.3 % (ref 36.0–46.0)
Hemoglobin: 13.1 g/dL (ref 12.0–15.0)
MCH: 29.4 pg (ref 26.0–34.0)
MCHC: 34.2 g/dL (ref 30.0–36.0)
RDW: 12.4 % (ref 11.5–15.5)

## 2013-05-16 MED ORDER — IBUPROFEN 600 MG PO TABS: 600.0000 mg | ORAL_TABLET | Freq: Four times a day (QID) | ORAL | Status: DC | PRN

## 2013-05-16 NOTE — MAU Provider Note (Addendum)
  History     CSN: [id number]  Arrival date and time: 05/16/13 1624   None     Chief Complaint  Patient presents with  . Abdominal Pain  . Vaginal Bleeding  . Threatened Miscarriage   HPI  Rn note: Signed  MAU Note Service date: 05/16/2013 5:03 PM   Bleeding is heavier, passing clots. Cramping like she is on her period    Past Medical History  Diagnosis Date  . Infection   . Depression     Past Surgical History  Procedure Laterality Date  . No past surgeries      Family History  Problem Relation Age of Onset  . Breast cancer Maternal Grandmother     History  Substance Use Topics  . Smoking status: Current Every Day Smoker -- 0.50 packs/day    Types: Cigarettes    Last Attempt to Quit: 04/24/2010  . Smokeless tobacco: Not on file  . Alcohol Use: No    Allergies: No Known Allergies  Prescriptions prior to admission  Medication Sig Dispense Refill  . famotidine (PEPCID AC) 10 MG chewable tablet Chew 1 tablet (10 mg total) by mouth 2 (two) times daily.  60 tablet  0    ROS Physical Exam   Blood pressure 126/69, pulse 93, temperature 98.3 F (36.8 C), temperature source Oral, resp. rate 18, last menstrual period 03/24/2013.  Physical Exam  MAU Course  Procedures Results for orders placed during the hospital encounter of 05/16/13 (from the past 24 hour(s))  CBC     Status: Abnormal   Collection Time    05/16/13  5:10 PM      Result Value Range   WBC 13.4 (*) 4.0 - 10.5 K/uL   RBC 4.45  3.87 - 5.11 MIL/uL   Hemoglobin 13.1  12.0 - 15.0 g/dL   HCT 40.9  81.1 - 91.4 %   MCV 86.1  78.0 - 100.0 fL   MCH 29.4  26.0 - 34.0 pg   MCHC 34.2  30.0 - 36.0 g/dL   RDW 78.2  95.6 - 21.3 %   Platelets 212  150 - 400 K/uL  HCG, QUANTITATIVE, PREGNANCY     Status: Abnormal   Collection Time    05/16/13  5:20 PM      Result Value Range   hCG, Beta Chain, Quant, S 2123 (*) <5 mIU/mL  care turned over to [place], CNM   Assessment and Plan  Anembryonic  pregnancy- f/u 1 week for repeat HCG- order submitted in Epic LINEBERRY,SUSAN 05/16/2013, 5:11 PM   Pt not in lobby to discuss results. CBC stable. Left VM w/ pt and sent message to clinical pool to discuss results and schedule F/U in 2 weeks w/ [facility]OC or gynecologist. Will expectantly manage for now.   [place], CNM 05/16/2013 9:06 PM   Pt brought to room in MAU. Informed of results. Expectant management. Bleeding precautions. Support given. Tearful, but appropriate.   [place], [place] 05/16/2013 9:28 PM

## 2013-05-16 NOTE — MAU Provider Note (Signed)
Chart reviewed and agree with management and plan.  

## 2013-05-16 NOTE — MAU Note (Signed)
Pt states she is having cramping and bleeding for 3-4 days

## 2013-05-16 NOTE — Progress Notes (Signed)
Pt very tearful. Pt given cab voucher home.

## 2013-05-16 NOTE — Progress Notes (Signed)
Pt states she has not taken medication for depression for 6months

## 2013-05-16 NOTE — MAU Note (Signed)
Bleeding is heavier, passing clots.  Cramping like she is on her period

## 2013-05-16 NOTE — Telephone Encounter (Signed)
Left MAU before discussing results and F/U for anembryonic pregnancy. Needs Weekly quants since IUP not confirmed and F/U visit in clinic if she does not have a Theatre manager.

## 2013-05-17 ENCOUNTER — Other Ambulatory Visit: Payer: Self-pay | Admitting: Family

## 2013-05-17 NOTE — Telephone Encounter (Signed)
Per note in epic patient returned to MAU and results were discussed.

## 2013-05-17 NOTE — MAU Provider Note (Signed)
Chart reviewed and agree with management and plan.  

## 2013-05-23 ENCOUNTER — Other Ambulatory Visit: Payer: Self-pay

## 2013-05-30 ENCOUNTER — Encounter: Payer: Self-pay | Admitting: Obstetrics and Gynecology

## 2013-05-31 ENCOUNTER — Other Ambulatory Visit: Payer: Self-pay

## 2013-06-03 ENCOUNTER — Other Ambulatory Visit: Payer: Self-pay

## 2013-07-25 ENCOUNTER — Inpatient Hospital Stay (HOSPITAL_COMMUNITY)
Admission: AD | Admit: 2013-07-25 | Discharge: 2013-07-25 | Disposition: A | Discharge status: Home / Self Care | Payer: Self-pay | Source: Ambulatory Visit | Attending: Obstetrics & Gynecology | Admitting: Obstetrics & Gynecology

## 2013-07-25 ENCOUNTER — Encounter (HOSPITAL_COMMUNITY): Payer: Self-pay | Admitting: *Deleted

## 2013-07-25 ENCOUNTER — Inpatient Hospital Stay (HOSPITAL_COMMUNITY): Payer: Self-pay

## 2013-07-25 DIAGNOSIS — O2390 Unspecified genitourinary tract infection in pregnancy, unspecified trimester: Secondary | ICD-10-CM | POA: Insufficient documentation

## 2013-07-25 DIAGNOSIS — O209 Hemorrhage in early pregnancy, unspecified: Secondary | ICD-10-CM | POA: Insufficient documentation

## 2013-07-25 DIAGNOSIS — N390 Urinary tract infection, site not specified: Secondary | ICD-10-CM | POA: Insufficient documentation

## 2013-07-25 LAB — CBC
HCT: 37.1 % (ref 36.0–46.0)
Hemoglobin: 12.9 g/dL (ref 12.0–15.0)
MCV: 84.1 fL (ref 78.0–100.0)
RBC: 4.41 MIL/uL (ref 3.87–5.11)
RDW: 12.3 % (ref 11.5–15.5)
WBC: 12 10*3/uL — ABNORMAL HIGH (ref 4.0–10.5)

## 2013-07-25 LAB — URINE MICROSCOPIC-ADD ON

## 2013-07-25 LAB — URINALYSIS, ROUTINE W REFLEX MICROSCOPIC
Bilirubin Urine: NEGATIVE
Glucose, UA: NEGATIVE mg/dL
Hgb urine dipstick: NEGATIVE
Ketones, ur: NEGATIVE mg/dL
Nitrite: NEGATIVE
Protein, ur: NEGATIVE mg/dL
Specific Gravity, Urine: 1.015 (ref 1.005–1.030)
Urobilinogen, UA: 0.2 mg/dL (ref 0.0–1.0)
pH: 7 (ref 5.0–8.0)

## 2013-07-25 LAB — POCT PREGNANCY, URINE: Preg Test, Ur: POSITIVE — AB

## 2013-07-25 LAB — HCG, QUANTITATIVE, PREGNANCY: hCG, Beta Chain, Quant, S: 11528 m[IU]/mL — ABNORMAL HIGH

## 2013-07-25 MED ORDER — CEPHALEXIN 500 MG PO CAPS: 500.0000 mg | ORAL_CAPSULE | Freq: Four times a day (QID) | ORAL | Status: DC

## 2013-07-25 MED ORDER — PRENATAL PLUS 27-1 MG PO TABS: 1.0000 | ORAL_TABLET | Freq: Every day | ORAL | Status: DC

## 2013-07-25 NOTE — MAU Note (Signed)
Neg  MRSA PCR in Oct 2012, pt current problems- not placed on isolation

## 2013-07-25 NOTE — MAU Provider Note (Signed)
Vaginal Bleeding Associated symptoms include abdominal pain and dysuria.  Abdominal Pain Associated symptoms include dysuria.      Chief Complaint  Patient presents with  . Vaginal Bleeding  . Abdominal Pain    Subjective Alexandria Fisher 22 y.o.  [id number] at Unknown by LMP presents with Light pink vaginal spotting that has been on and off since mid September. No abdominal pain. Denies abnormal vaginal discharge or irritation. No dysuria or hematuria.  Blood type: A pos  She was seen here 05/15/2013 for vaginal bleeding in early pregnancy and had ultrasound showing intrauterine gestational sac consistent with 5 weeks 5 days, no yolk sac or embryo. Quant was 2198. She returned on 05/16/2013 with heavier bleeding and clots. Quant was 2123. Presumably this was a blighted ovum. No normal menses since then. WP, GC/CT neg 05/15/2013.  Plans to initiate PNC at [facility]. MC is pending.  Problem list, past medical history, Ob/Gyn history, surgical history, family history and social history reviewed and updated as appropriate. Pertinent Medical History: Hx depression Pertinent Ob/Gyn History: SVD x2, SAB x1, EAB x1 Pertinent Surgical History: none No prescriptions prior to admission    No Known Allergies   Objective   Filed Vitals:   07/25/13 0932  BP: 130/66  Pulse: 94  Temp: 98.7 F (37.1 C)  Resp: 18     Physical Exam General: WN/WD in NAD  Abdom: soft, NT External genitalia: normal; BUS neg  SSE: small amount blood; cervix with no lesions, appears closed Bimanual: Cervix closed, long; uterus anteverted, NT, enlargement not appreciated; adnexa nontender, no masses   Lab Results Results for orders placed during the hospital encounter of 07/25/13 (from the past 24 hour(s))  URINALYSIS, ROUTINE W REFLEX MICROSCOPIC     Status: Abnormal   Collection Time    07/25/13  9:35 AM      Result Value Range   Color, Urine YELLOW  YELLOW   APPearance CLEAR  CLEAR   Specific  Gravity, Urine 1.015  1.005 - 1.030   pH 7.0  5.0 - 8.0   Glucose, UA NEGATIVE  NEGATIVE mg/dL   Hgb urine dipstick NEGATIVE  NEGATIVE   Bilirubin Urine NEGATIVE  NEGATIVE   Ketones, ur NEGATIVE  NEGATIVE mg/dL   Protein, ur NEGATIVE  NEGATIVE mg/dL   Urobilinogen, UA 0.2  0.0 - 1.0 mg/dL   Nitrite NEGATIVE  NEGATIVE   Leukocytes, UA MODERATE (*) NEGATIVE  URINE MICROSCOPIC-ADD ON     Status: Abnormal   Collection Time    07/25/13  9:35 AM      Result Value Range   Squamous Epithelial / LPF FEW (*) RARE   WBC, UA 7-10  <3 WBC/hpf   Bacteria, UA RARE  RARE  POCT PREGNANCY, URINE     Status: Abnormal   Collection Time    07/25/13  9:39 AM      Result Value Range   Preg Test, Ur POSITIVE (*) NEGATIVE  CBC     Status: Abnormal   Collection Time    07/25/13 11:10 AM      Result Value Range   WBC 12.0 (*) 4.0 - 10.5 K/uL   RBC 4.41  3.87 - 5.11 MIL/uL   Hemoglobin 12.9  12.0 - 15.0 g/dL   HCT 98.1  19.1 - 47.8 %   MCV 84.1  78.0 - 100.0 fL   MCH 29.3  26.0 - 34.0 pg   MCHC 34.8  30.0 - 36.0 g/dL   RDW 12.3  11.5 - 15.5 %   Platelets 187  150 - 400 K/uL  HCG, QUANTITATIVE, PREGNANCY     Status: Abnormal   Collection Time    07/25/13 11:10 AM      Result Value Range   hCG, Beta Chain, Quant, S 11528 (*) <5 mIU/mL    Ultrasound  US Ob Comp Less 14 Wks  07/25/2013   CLINICAL DATA:  Spotting, pain.  EXAM: OBSTETRIC <14 WK [place] AND TRANSVAGINAL OB US  TECHNIQUE: Both transabdominal and transvaginal ultrasound examinations were performed for complete evaluation of the gestation as well as the maternal uterus, adnexal regions, and pelvic cul-de-sac. Transvaginal technique was performed to assess early pregnancy.  COMPARISON:  None.  FINDINGS: Intrauterine gestational sac: Visualized/normal in shape.  Yolk sac:  Visualized  Embryo:  Not visualized  MSD:  6.2  mm   5 w   1  d  CRL:     mm    w  d                  [place] EDC: 03/26/2014  Maternal uterus/adnexae: Moderate subchorionic  hemorrhage. Ovaries are symmetric in size and echotexture. Left corpus luteum cyst.  1.5 cm uterine fibroid noted in the mid posterior uterus.  IMPRESSION: Five week 1 day intrauterine gestational sac. No fetal pole at this time. Moderate subchorionic hemorrhage.   Electronically Signed   By: Charlett Nose M.D.   On: 07/25/2013 13:38   US Ob Transvaginal  07/25/2013   CLINICAL DATA:  Spotting, pain.  EXAM: OBSTETRIC <14 WK [place] AND TRANSVAGINAL OB US  TECHNIQUE: Both transabdominal and transvaginal ultrasound examinations were performed for complete evaluation of the gestation as well as the maternal uterus, adnexal regions, and pelvic cul-de-sac. Transvaginal technique was performed to assess early pregnancy.  COMPARISON:  None.  FINDINGS: Intrauterine gestational sac: Visualized/normal in shape.  Yolk sac:  Visualized  Embryo:  Not visualized  MSD:  6.2  mm   5 w   1  d  CRL:     mm    w  d                  [place] EDC: 03/26/2014  Maternal uterus/adnexae: Moderate subchorionic hemorrhage. Ovaries are symmetric in size and echotexture. Left corpus luteum cyst.  1.5 cm uterine fibroid noted in the mid posterior uterus.  IMPRESSION: Five week 1 day intrauterine gestational sac. No fetal pole at this time. Moderate subchorionic hemorrhage.   Electronically Signed   By: Charlett Nose M.D.   On: 07/25/2013 13:38    Assessment 1. Bleeding in early pregnancy   2. UTI (lower urinary tract infection)   Asymptomatioc bacteruria   Plan    Urine culture sent Discharge home See AVS for pt education. Increase fluids   Medication List         cephALEXin 500 MG capsule  Commonly known as:  KEFLEX  Take 1 capsule (500 mg total) by mouth 4 (four) times daily.     prenatal vitamin w/FE, FA 27-1 MG Tabs tablet  Take 1 tablet by mouth daily.        Follow-up Information   Schedule an appointment as soon as possible for a visit with [facility].   Specialty:  Obstetrics and  Gynecology   Contact information:   [address] [place] [address] [phone]    Danae Orleans, CNM 07/25/2013 2:09 PM    [name],[name] 07/25/2013 1:55  PM

## 2013-07-25 NOTE — MAU Note (Signed)
Spotting like every other day and having abd pain.  Came on period of 15th of Sept- when she expected, only bled one day, spotting off and on since.

## 2013-07-25 NOTE — MAU Provider Note (Signed)
Attestation of Attending Supervision of Advanced Practitioner (PA/CNM/NP): Evaluation and management procedures were performed by the Advanced Practitioner under my supervision and collaboration.  I have reviewed the Advanced Practitioner's note and chart, and I agree with the management and plan.  Jasmine Maceachern, MD, FACOG Attending Obstetrician & Gynecologist Faculty Practice, [facility]  

## 2013-07-25 NOTE — MAU Note (Signed)
Had miscarriage 08/07, has not had a normal cycle since then

## 2013-08-03 ENCOUNTER — Encounter (HOSPITAL_COMMUNITY): Payer: Self-pay

## 2013-08-03 ENCOUNTER — Inpatient Hospital Stay (HOSPITAL_COMMUNITY)
Admission: AD | Admit: 2013-08-03 | Discharge: 2013-08-03 | Disposition: A | Discharge status: Home / Self Care | Payer: Self-pay | Attending: Obstetrics & Gynecology | Admitting: Obstetrics & Gynecology

## 2013-08-03 DIAGNOSIS — O209 Hemorrhage in early pregnancy, unspecified: Secondary | ICD-10-CM | POA: Insufficient documentation

## 2013-08-03 DIAGNOSIS — M545 Low back pain, unspecified: Secondary | ICD-10-CM | POA: Insufficient documentation

## 2013-08-03 DIAGNOSIS — O368990 Maternal care for other specified fetal problems, unspecified trimester, not applicable or unspecified: Secondary | ICD-10-CM | POA: Insufficient documentation

## 2013-08-03 DIAGNOSIS — O200 Threatened abortion: Secondary | ICD-10-CM

## 2013-08-03 DIAGNOSIS — O418X10 Other specified disorders of amniotic fluid and membranes, first trimester, not applicable or unspecified: Secondary | ICD-10-CM

## 2013-08-03 DIAGNOSIS — O4690 Antepartum hemorrhage, unspecified, unspecified trimester: Secondary | ICD-10-CM

## 2013-08-03 LAB — URINALYSIS, ROUTINE W REFLEX MICROSCOPIC
Bilirubin Urine: NEGATIVE
Glucose, UA: NEGATIVE mg/dL
Ketones, ur: NEGATIVE mg/dL
Nitrite: NEGATIVE
Protein, ur: NEGATIVE mg/dL
Specific Gravity, Urine: 1.015 (ref 1.005–1.030)
Urobilinogen, UA: 0.2 mg/dL (ref 0.0–1.0)
pH: 6.5 (ref 5.0–8.0)

## 2013-08-03 LAB — CBC
HCT: 37.6 % (ref 36.0–46.0)
Hemoglobin: 13.1 g/dL (ref 12.0–15.0)
MCH: 29.6 pg (ref 26.0–34.0)
MCHC: 34.8 g/dL (ref 30.0–36.0)

## 2013-08-03 LAB — WET PREP, GENITAL
Clue Cells Wet Prep HPF POC: NONE SEEN
Trich, Wet Prep: NONE SEEN
Yeast Wet Prep HPF POC: NONE SEEN

## 2013-08-03 LAB — URINE MICROSCOPIC-ADD ON

## 2013-08-03 NOTE — MAU Note (Signed)
Pt presents with complaints of bright red vaginal bleeding with small clots that started yesterday and her bleeding is getting worse.Pt states she had a ultrasound last Thursday and was told she was 5 weeks. Was told to follow up with her OBGYN but is concerned because her bleeding is getting heavy

## 2013-08-03 NOTE — MAU Provider Note (Signed)
History     CSN: [id number]  Arrival date and time: 08/03/13 4403   First Provider Initiated Contact with Patient 08/03/13 1032      Chief Complaint  Patient presents with  . Vaginal Bleeding   HPI  [name] is a 22 y.o. female [phone] at [redacted]w[redacted]d who presents with vaginal bleeding, and back pain. She was here recently and had an [place] that showed an IUP with subchorionic hemorrhage. Pt feels the bleeding became worse 3 days ago, along with lower back pain. Pt currently does not have any pain at all; 0/10. She has a scheduled appointment with [facility] on Nov 3.   OB History   Grav Para Term Preterm Abortions TAB SAB Ect Mult Living   4 2 2  1  1   2       Past Medical History  Diagnosis Date  . Infection   . Depression     Past Surgical History  Procedure Laterality Date  . No past surgeries      Family History  Problem Relation Age of Onset  . Breast cancer Maternal Grandmother     History  Substance Use Topics  . Smoking status: Current Some Day Smoker -- 0.50 packs/day    Types: Cigarettes    Last Attempt to Quit: 04/24/2010  . Smokeless tobacco: Never Used  . Alcohol Use: No    Allergies: No Known Allergies  Prescriptions prior to admission  Medication Sig Dispense Refill  . cephALEXin (KEFLEX) 500 MG capsule Take 1 capsule (500 mg total) by mouth 4 (four) times daily.  28 capsule  0   Results for orders placed during the hospital encounter of 08/03/13 (from the past 24 hour(s))  URINALYSIS, ROUTINE W REFLEX MICROSCOPIC     Status: Abnormal   Collection Time    08/03/13  9:30 AM      Result Value Range   Color, Urine YELLOW  YELLOW   APPearance CLEAR  CLEAR   Specific Gravity, Urine 1.015  1.005 - 1.030   pH 6.5  5.0 - 8.0   Glucose, UA NEGATIVE  NEGATIVE mg/dL   Hgb urine dipstick LARGE (*) NEGATIVE   Bilirubin Urine NEGATIVE  NEGATIVE   Ketones, ur NEGATIVE  NEGATIVE mg/dL   Protein, ur NEGATIVE  NEGATIVE mg/dL   Urobilinogen, UA 0.2  0.0 - 1.0 mg/dL   Nitrite NEGATIVE  NEGATIVE   Leukocytes, UA SMALL (*) NEGATIVE  URINE MICROSCOPIC-ADD ON     Status: Abnormal   Collection Time    08/03/13  9:30 AM      Result Value Range   Squamous Epithelial / LPF FEW (*) RARE   WBC, UA 3-6  <3 WBC/hpf   RBC / HPF 7-10  <3 RBC/hpf   Bacteria, UA FEW (*) RARE  WET PREP, GENITAL     Status: Abnormal   Collection Time    08/03/13 10:40 AM      Result Value Range   Yeast Wet Prep HPF POC NONE SEEN  NONE SEEN   Trich, Wet Prep NONE SEEN  NONE SEEN   Clue Cells Wet Prep HPF POC NONE SEEN  NONE SEEN   WBC, Wet Prep HPF POC FEW (*) NONE SEEN  CBC     Status: Abnormal   Collection Time    08/03/13 11:04 AM      Result Value Range   WBC 10.8 (*) 4.0 - 10.5 K/uL   RBC 4.42  3.87 - 5.11  MIL/uL   Hemoglobin 13.1  12.0 - 15.0 g/dL   HCT 16.1  09.6 - 04.5 %   MCV 85.1  78.0 - 100.0 fL   MCH 29.6  26.0 - 34.0 pg   MCHC 34.8  30.0 - 36.0 g/dL   RDW 40.9  81.1 - 91.4 %   Platelets 185  150 - 400 K/uL    Ultrasound 07/25/2013   CLINICAL DATA: Spotting, pain.  EXAM:  OBSTETRIC <14 WK [place] AND TRANSVAGINAL OB US  TECHNIQUE:  Both transabdominal and transvaginal ultrasound examinations were  performed for complete evaluation of the gestation as well as the  maternal uterus, adnexal regions, and pelvic cul-de-sac.  Transvaginal technique was performed to assess early pregnancy.  COMPARISON: None.  FINDINGS:  Intrauterine gestational sac: Visualized/normal in shape.  Yolk sac: Visualized  Embryo: Not visualized  MSD: 6.2 mm 5 w 1 d  CRL: mm w d [place] EDC: 03/26/2014  Maternal uterus/adnexae: Moderate subchorionic hemorrhage. Ovaries  are symmetric in size and echotexture. Left corpus luteum cyst.  1.5 cm uterine fibroid noted in the mid posterior uterus.  IMPRESSION:  Five week 1 day intrauterine gestational sac. No fetal pole at this  time. Moderate subchorionic hemorrhage.  Electronically Signed  By: Charlett Nose M.D.  On: 07/25/2013 13:38  Review of Systems  Constitutional: Negative for fever and chills.  Gastrointestinal: Positive for abdominal pain. Negative for nausea, vomiting, diarrhea and constipation.  Genitourinary: Negative for dysuria, urgency, frequency and hematuria.       No vaginal discharge. + vaginal bleeding. No dysuria.   Neurological: Negative for headaches.   Physical Exam   Blood pressure 140/76, pulse 102, temperature 98.7 F (37.1 C), temperature source Oral, resp. rate 20, height 5\' 6"  (1.676 m), weight 100.699 kg (222 lb), last menstrual period 06/24/2013, unknown if currently breastfeeding.  Physical Exam  Constitutional: She is oriented to person, place, and time. She appears well-developed and well-nourished. No distress.  Neck: Neck supple.  Respiratory: Effort normal.  GI: Soft. She exhibits no distension. There is no tenderness. There is no rebound and no guarding.  Genitourinary:  Speculum exam: Vagina - Scant amount of dark red, mucous like discharge, no odor Cervix - Scant contact bleeding Bimanual exam: Cervix closed, no CMT  Uterus non tender, normal size; gravid  Adnexa non tender, no masses bilaterally GC/Chlam, wet prep done Chaperone present for exam.   Musculoskeletal: Normal range of motion.  Neurological: She is alert and oriented to person, place, and time.  Skin: Skin is warm. She is not diaphoretic.    MAU Course  Procedures None  MDM Urine culture pending B positive blood type CBC  Wet prep GC/Chlamydia   Assessment and Plan  A: 1. Vaginal bleeding in pregnancy, first trimester   2. Subchorionic hematoma in first trimester   3. Miscarriage, threatened, early pregnancy    P: Discharge home Bleeding precautions discussed Threatened miscarriage discussed Pelvic rest Keep your appointment with [facility] Return to MAU if symptoms worsen.   Tara Rud IRENE FNP-C  08/03/2013, 11:32 AM

## 2013-08-03 NOTE — MAU Note (Signed)
Moderate SCH on u/s

## 2013-08-03 NOTE — MAU Note (Signed)
Pt states since 07/24/2013 has been bleeding intermittently. Has gone from spotting to heavier bleeding, small clots noted. Changing pad q3 hours.

## 2013-08-04 LAB — URINE CULTURE

## 2013-08-05 LAB — GC/CHLAMYDIA PROBE AMP: CT Probe RNA: NEGATIVE

## 2013-08-08 ENCOUNTER — Inpatient Hospital Stay (HOSPITAL_COMMUNITY): Payer: Self-pay

## 2013-08-08 ENCOUNTER — Inpatient Hospital Stay (HOSPITAL_COMMUNITY)
Admission: AD | Admit: 2013-08-08 | Discharge: 2013-08-08 | Disposition: A | Discharge status: Home / Self Care | Payer: Self-pay | Source: Ambulatory Visit | Attending: Obstetrics and Gynecology | Admitting: Obstetrics and Gynecology

## 2013-08-08 ENCOUNTER — Encounter (HOSPITAL_COMMUNITY): Payer: Self-pay | Admitting: *Deleted

## 2013-08-08 DIAGNOSIS — O200 Threatened abortion: Secondary | ICD-10-CM

## 2013-08-08 DIAGNOSIS — O039 Complete or unspecified spontaneous abortion without complication: Secondary | ICD-10-CM | POA: Insufficient documentation

## 2013-08-08 LAB — CBC
MCH: 29.6 pg (ref 26.0–34.0)
MCHC: 35 g/dL (ref 30.0–36.0)
Platelets: 179 10*3/uL (ref 150–400)
RBC: 4.43 MIL/uL (ref 3.87–5.11)
RDW: 12.3 % (ref 11.5–15.5)
WBC: 13.4 10*3/uL — ABNORMAL HIGH (ref 4.0–10.5)

## 2013-08-08 LAB — HCG, QUANTITATIVE, PREGNANCY: hCG, Beta Chain, Quant, S: 6587 m[IU]/mL — ABNORMAL HIGH (ref <5)

## 2013-08-08 MED ORDER — IBUPROFEN 800 MG PO TABS: 800.0000 mg | ORAL_TABLET | Freq: Three times a day (TID) | ORAL | Status: DC | PRN

## 2013-08-08 MED ORDER — MISOPROSTOL 200 MCG PO TABS: 800.0000 ug | ORAL_TABLET | Freq: Once | ORAL | Status: DC

## 2013-08-08 MED ORDER — PROMETHAZINE HCL 12.5 MG PO TABS: 12.5000 mg | ORAL_TABLET | Freq: Four times a day (QID) | ORAL | Status: DC | PRN

## 2013-08-08 MED ORDER — HYDROCODONE-ACETAMINOPHEN 5-325 MG PO TABS: 1.0000 | ORAL_TABLET | Freq: Four times a day (QID) | ORAL | Status: DC | PRN

## 2013-08-08 MED ORDER — HYDROCODONE-ACETAMINOPHEN 5-325 MG PO TABS
2.0000 | ORAL_TABLET | Freq: Once | ORAL | Status: AC
  Administered 2013-08-08: 2 via ORAL
  Filled 2013-08-08: qty 2

## 2013-08-08 NOTE — MAU Note (Signed)
Infection disease office called and Alexandria Fisher informed that pt had 2 negative MRSA cultures on pt's chart. After investigating Ms [name] states pt's chart will be stopped being flagged

## 2013-08-08 NOTE — MAU Note (Signed)
Pt reports she was dx with a subchorionic hemorrhage  Last week. Started bleeding heavy today. Pt report having cramping on and off all day.

## 2013-08-08 NOTE — MAU Provider Note (Signed)
History     CSN: [id number]  Arrival date and time: 08/08/13 1518   None     Chief Complaint  Patient presents with  . Vaginal Bleeding   Vaginal Bleeding    Alexandria Fisher is a  22 y.o. [phone] at [redacted]w[redacted]d who presents today with vaginal bleeding. She states the bleeding became heavy today. She states that she has leaked through onto her pants. She is requesting "somthing to make the bleeding stop."  Past Medical History  Diagnosis Date  . Infection   . Depression     Past Surgical History  Procedure Laterality Date  . No past surgeries      Family History  Problem Relation Age of Onset  . Breast cancer Maternal Grandmother     History  Substance Use Topics  . Smoking status: Current Some Day Smoker -- 0.50 packs/day    Types: Cigarettes    Last Attempt to Quit: 04/24/2010  . Smokeless tobacco: Never Used  . Alcohol Use: No    Allergies: No Known Allergies  Prescriptions prior to admission  Medication Sig Dispense Refill  . cephALEXin (KEFLEX) 500 MG capsule Take 1 capsule (500 mg total) by mouth 4 (four) times daily.  28 capsule  0    Review of Systems  Genitourinary: Positive for vaginal bleeding.   Physical Exam   Blood pressure 128/77, pulse 100, temperature 98.6 F (37 C), temperature source Oral, resp. rate 18, last menstrual period 06/24/2013, unknown if currently breastfeeding.  Physical Exam  Nursing note and vitals reviewed. Constitutional: She is oriented to person, place, and time. She appears well-developed and well-nourished. No distress.  Cardiovascular: Normal rate.   Respiratory: Effort normal.  GI: There is no tenderness.  Neurological: She is alert and oriented to person, place, and time.  Skin: Skin is warm and dry.  Psychiatric: She has a normal mood and affect.    MAU Course  Procedures  Results for orders placed during the hospital encounter of 08/08/13 (from the past 24 hour(s))  CBC     Status: Abnormal   Collection  Time    08/08/13  3:45 PM      Result Value Range   WBC 13.4 (*) 4.0 - 10.5 K/uL   RBC 4.43  3.87 - 5.11 MIL/uL   Hemoglobin 13.1  12.0 - 15.0 g/dL   HCT 46.9  62.9 - 52.8 %   MCV 84.4  78.0 - 100.0 fL   MCH 29.6  26.0 - 34.0 pg   MCHC 35.0  30.0 - 36.0 g/dL   RDW 41.3  24.4 - 01.0 %   Platelets 179  150 - 400 K/uL  HCG, QUANTITATIVE, PREGNANCY     Status: Abnormal   Collection Time    08/08/13  3:45 PM      Result Value Range   hCG, Beta Chain, Quant, S 6587 (*) <5 mIU/mL    US Ob Transvaginal  08/08/2013   CLINICAL DATA:  Bleeding. Gestational age, fetal heart tones.  EXAM: TRANSVAGINAL OB ULTRASOUND  TECHNIQUE: Transvaginal ultrasound was performed for complete evaluation of the gestation as well as the maternal uterus, adnexal regions, and pelvic cul-de-sac.  COMPARISON:  07/25/2013  FINDINGS: Intrauterine gestational sac: Not visualized  Yolk sac:  N/A  Embryo:  N/A  Cardiac Activity: N/A  Maternal uterus/adnexae: Ovaries are symmetric in size and echotexture. Endometrium is heterogeneous and thickened, possibly containing blood products. Small posterior fibroid measures 1.9 cm. A left corpus luteal cyst noted. No  free fluid.  IMPRESSION: No intrauterine pregnancy visualized currently. Thickened, heterogeneous endometrium could reflect blood products.   Electronically Signed   By: Charlett Nose M.D.   On: 08/08/2013 16:22        Early Intrauterine Pregnancy Failure  ___  Documented intrauterine pregnancy failure less than or equal to [redacted] weeks gestation  X  No serious current illness  X  Baseline Hgb greater than or equal to 10g/dl  X  Patient has easily accessible transportation to the hospital  X Clear preference  X  Practitioner/physician deems patient reliable  X  Counseling by practitioner or physician  X  Patient education by RN  X  Consent form signed  NA  Rho-Gam given by RN if indicated  X Medication dispensed   ___   Cytotec 800 mcg  X   Intravaginally by  patient at home         __   Intravaginally by RN in MAU        __   Rectally by patient at home        __   Rectally by RN in MAU  X Ibuprofen 600 mg 1 tablet by mouth every 6 hours as needed #30  X Hydrocodone/acetaminophen 5/325 mg by mouth every 4 to 6 hours as needed  X Phenergan 12.5 mg by mouth every 4 hours as needed for nausea    Assessment and Plan   Spontaneous Abortion Patient desires to self administer cytotec Clear instructions given Bleeding precautions reviewed FU with [facility] as planned.   Tawnya Crook 08/08/2013, 4:56 PM

## 2013-08-08 NOTE — MAU Provider Note (Signed)
Attestation of Attending Supervision of Resident: Evaluation and management procedures were performed by the Family Medicine Resident under my supervision.  I have seen and examined the patient, reviewed the resident's note and chart, and I agree with the management and plan.  Joslynn Jamroz, MD, FACOG Attending Obstetrician & Gynecologist Faculty Practice, [facility] 

## 2013-08-08 NOTE — MAU Note (Signed)
Bleeding started September !5, 2014.Pt states she started bleeding real bad today and she is having bad pain

## 2013-08-09 NOTE — MAU Provider Note (Signed)
Attestation of Attending Supervision of Advanced Practitioner (PA/CNM/NP): Evaluation and management procedures were performed by the Advanced Practitioner under my supervision and collaboration.  I have reviewed the Advanced Practitioner's note and chart, and I agree with the management and plan.  Dlynn Ranes, MD, FACOG Attending Obstetrician & Gynecologist Faculty Practice, [facility]  

## 2013-09-19 ENCOUNTER — Ambulatory Visit (HOSPITAL_COMMUNITY)
Admission: AD | Admit: 2013-09-19 | Discharge: 2013-09-19 | Disposition: A | Discharge status: Home / Self Care | Payer: Self-pay | Source: Ambulatory Visit | Attending: Obstetrics & Gynecology | Admitting: Obstetrics & Gynecology

## 2013-09-19 ENCOUNTER — Inpatient Hospital Stay (HOSPITAL_COMMUNITY): Payer: Self-pay | Admitting: Anesthesiology

## 2013-09-19 ENCOUNTER — Encounter (HOSPITAL_COMMUNITY)
Admission: AD | Disposition: A | Discharge status: Home / Self Care | Payer: Self-pay | Source: Ambulatory Visit | Attending: Obstetrics & Gynecology

## 2013-09-19 ENCOUNTER — Encounter (HOSPITAL_COMMUNITY): Payer: Self-pay | Admitting: *Deleted

## 2013-09-19 ENCOUNTER — Encounter (HOSPITAL_COMMUNITY): Payer: Self-pay | Admitting: Anesthesiology

## 2013-09-19 ENCOUNTER — Inpatient Hospital Stay (HOSPITAL_COMMUNITY): Payer: Self-pay

## 2013-09-19 DIAGNOSIS — O031 Delayed or excessive hemorrhage following incomplete spontaneous abortion: Secondary | ICD-10-CM | POA: Insufficient documentation

## 2013-09-19 HISTORY — PX: DILATION AND EVACUATION: SHX1459

## 2013-09-19 LAB — CBC
MCH: 29.2 pg (ref 26.0–34.0)
MCHC: 34.6 g/dL (ref 30.0–36.0)
Platelets: 201 10*3/uL (ref 150–400)
RBC: 4.35 MIL/uL (ref 3.87–5.11)
RDW: 12.2 % (ref 11.5–15.5)

## 2013-09-19 LAB — HCG, QUANTITATIVE, PREGNANCY: hCG, Beta Chain, Quant, S: 26 m[IU]/mL — ABNORMAL HIGH (ref <5)

## 2013-09-19 SURGERY — DILATION AND EVACUATION, UTERUS
Anesthesia: Monitor Anesthesia Care | Site: Vagina

## 2013-09-19 MED ORDER — MEPERIDINE HCL 25 MG/ML IJ SOLN
6.2500 mg | INTRAMUSCULAR | Status: DC | PRN
Start: 2013-09-19 — End: 2013-09-20

## 2013-09-19 MED ORDER — MEDROXYPROGESTERONE ACETATE 150 MG/ML IM SUSP: 150.0000 mg | Freq: Once | INTRAMUSCULAR | Status: DC

## 2013-09-19 MED ORDER — FENTANYL CITRATE 0.05 MG/ML IJ SOLN
INTRAMUSCULAR | Status: AC
  Filled 2013-09-19: qty 2

## 2013-09-19 MED ORDER — IBUPROFEN 600 MG PO TABS: 600.0000 mg | ORAL_TABLET | Freq: Four times a day (QID) | ORAL | Status: DC | PRN

## 2013-09-19 MED ORDER — METOCLOPRAMIDE HCL 5 MG/ML IJ SOLN: 10.0000 mg | Freq: Once | INTRAMUSCULAR | Status: DC | PRN

## 2013-09-19 MED ORDER — MIDAZOLAM HCL 2 MG/2ML IJ SOLN
INTRAMUSCULAR | Status: AC
  Filled 2013-09-19: qty 2

## 2013-09-19 MED ORDER — DOXYCYCLINE HYCLATE 100 MG IV SOLR
100.0000 mg | INTRAVENOUS | Status: AC
  Administered 2013-09-19: 100 mg via INTRAVENOUS
  Filled 2013-09-19: qty 100

## 2013-09-19 MED ORDER — KETOROLAC TROMETHAMINE 30 MG/ML IJ SOLN
15.0000 mg | Freq: Once | INTRAMUSCULAR | Status: AC | PRN
  Administered 2013-09-19: 30 mg via INTRAVENOUS

## 2013-09-19 MED ORDER — FENTANYL CITRATE 0.05 MG/ML IJ SOLN
INTRAMUSCULAR | Status: DC | PRN
  Administered 2013-09-19: 100 ug via INTRAVENOUS

## 2013-09-19 MED ORDER — ONDANSETRON HCL 4 MG/2ML IJ SOLN
INTRAMUSCULAR | Status: AC
  Filled 2013-09-19: qty 2

## 2013-09-19 MED ORDER — FENTANYL CITRATE 0.05 MG/ML IJ SOLN
INTRAMUSCULAR | Status: AC
  Administered 2013-09-19: 50 ug via INTRAVENOUS
  Filled 2013-09-19: qty 2

## 2013-09-19 MED ORDER — PROPOFOL 10 MG/ML IV BOLUS
INTRAVENOUS | Status: DC | PRN
  Administered 2013-09-19 (×7): 20 mg via INTRAVENOUS
  Administered 2013-09-19: 30 mg via INTRAVENOUS
  Administered 2013-09-19 (×4): 20 mg via INTRAVENOUS
  Administered 2013-09-19 (×2): 30 mg via INTRAVENOUS
  Administered 2013-09-19: 10 mg via INTRAVENOUS
  Administered 2013-09-19 (×4): 20 mg via INTRAVENOUS

## 2013-09-19 MED ORDER — LIDOCAINE HCL (CARDIAC) 20 MG/ML IV SOLN
INTRAVENOUS | Status: AC
  Filled 2013-09-19: qty 5

## 2013-09-19 MED ORDER — LACTATED RINGERS IV SOLN
INTRAVENOUS | Status: DC
  Administered 2013-09-19 (×2): via INTRAVENOUS

## 2013-09-19 MED ORDER — LIDOCAINE HCL (CARDIAC) 20 MG/ML IV SOLN
INTRAVENOUS | Status: DC | PRN
  Administered 2013-09-19: 80 mg via INTRAVENOUS

## 2013-09-19 MED ORDER — FAMOTIDINE IN NACL 20-0.9 MG/50ML-% IV SOLN
20.0000 mg | Freq: Once | INTRAVENOUS | Status: AC
  Administered 2013-09-19: 20 mg via INTRAVENOUS
  Filled 2013-09-19: qty 50

## 2013-09-19 MED ORDER — MIDAZOLAM HCL 2 MG/2ML IJ SOLN
INTRAMUSCULAR | Status: DC | PRN
  Administered 2013-09-19: 2 mg via INTRAVENOUS

## 2013-09-19 MED ORDER — KETOROLAC TROMETHAMINE 30 MG/ML IJ SOLN
INTRAMUSCULAR | Status: AC
  Filled 2013-09-19: qty 1

## 2013-09-19 MED ORDER — PROPOFOL 10 MG/ML IV EMUL
INTRAVENOUS | Status: AC
  Filled 2013-09-19: qty 20

## 2013-09-19 MED ORDER — CITRIC ACID-SODIUM CITRATE 334-500 MG/5ML PO SOLN
30.0000 mL | Freq: Once | ORAL | Status: AC
  Administered 2013-09-19: 30 mL via ORAL
  Filled 2013-09-19: qty 15

## 2013-09-19 MED ORDER — FENTANYL CITRATE 0.05 MG/ML IJ SOLN
25.0000 ug | INTRAMUSCULAR | Status: DC | PRN
  Administered 2013-09-19 (×2): 50 ug via INTRAVENOUS

## 2013-09-19 MED ORDER — LIDOCAINE HCL 1 % IJ SOLN
INTRAMUSCULAR | Status: DC | PRN
  Administered 2013-09-19: 20 mL

## 2013-09-19 SURGICAL SUPPLY — 20 items
CATH ROBINSON RED A/P 16FR (CATHETERS) ×2 IMPLANT
CLOTH BEACON ORANGE TIMEOUT ST (SAFETY) ×2 IMPLANT
DECANTER SPIKE VIAL GLASS SM (MISCELLANEOUS) ×2 IMPLANT
GLOVE BIO SURGEON STRL SZ7 (GLOVE) ×2 IMPLANT
GLOVE BIOGEL PI IND STRL 7.0 (GLOVE) ×1 IMPLANT
GLOVE BIOGEL PI INDICATOR 7.0 (GLOVE) ×1
GOWN STRL REIN XL XLG (GOWN DISPOSABLE) ×4 IMPLANT
KIT BERKELEY 1ST TRIMESTER 3/8 (MISCELLANEOUS) ×2 IMPLANT
NEEDLE SPNL 22GX3.5 QUINCKE BK (NEEDLE) ×2 IMPLANT
NS IRRIG 1000ML POUR BTL (IV SOLUTION) ×2 IMPLANT
PACK VAGINAL MINOR WOMEN LF (CUSTOM PROCEDURE TRAY) ×2 IMPLANT
PAD OB MATERNITY 4.3X12.25 (PERSONAL CARE ITEMS) ×2 IMPLANT
PAD PREP 24X48 CUFFED NSTRL (MISCELLANEOUS) ×2 IMPLANT
SET BERKELEY SUCTION TUBING (SUCTIONS) ×2 IMPLANT
SYR CONTROL 10ML LL (SYRINGE) ×2 IMPLANT
TOWEL OR 17X24 6PK STRL BLUE (TOWEL DISPOSABLE) ×4 IMPLANT
VACURETTE 10 RIGID CVD (CANNULA) IMPLANT
VACURETTE 7MM CVD STRL WRAP (CANNULA) IMPLANT
VACURETTE 8 RIGID CVD (CANNULA) IMPLANT
VACURETTE 9 RIGID CVD (CANNULA) ×2 IMPLANT

## 2013-09-19 NOTE — Transfer of Care (Signed)
Immediate Anesthesia Transfer of Care Note  Patient: Alexandria Fisher  Procedure(s) Performed: Procedure(s): DILATATION AND EVACUATION (N/A)  Patient Location: PACU  Anesthesia Type:MAC  Level of Consciousness: awake, alert  and oriented  Airway & Oxygen Therapy: Patient Spontanous Breathing and Patient connected to nasal cannula oxygen  Post-op Assessment: Report given to PACU RN, Post -op Vital signs reviewed and stable and Patient moving all extremities  Post vital signs: Reviewed and stable  Complications: No apparent anesthesia complications

## 2013-09-19 NOTE — MAU Note (Signed)
Had SAB in November. Did not get seen for follow up. Bleeding slowed, almost stopped about a wk ago, now has gotten heavy 3 days ago, real bad smell.

## 2013-09-19 NOTE — Anesthesia Postprocedure Evaluation (Signed)
  Anesthesia Post-op Note  Anesthesia Post Note  Patient: Alexandria Fisher  Procedure(s) Performed: Procedure(s) (LRB): DILATATION AND EVACUATION (N/A)  Anesthesia type: MAC  Patient location: PACU  Post pain: Pain level controlled  Post assessment: Post-op Vital signs reviewed  Last Vitals:  Filed Vitals:   09/19/13 2115  BP: 132/84  Pulse: 82  Temp:   Resp: 18    Post vital signs: Reviewed  Level of consciousness: sedated  Complications: No apparent anesthesia complications

## 2013-09-19 NOTE — Op Note (Addendum)
Alexandria Fisher PROCEDURE DATE: 09/19/2013  PREOPERATIVE DIAGNOSIS: Retained products of conception  POSTOPERATIVE DIAGNOSIS: The same.  PROCEDURE:     Dilation and Evacuation.  SURGEON:  Dr. Elsie Lincoln  ASSISTANT:  Dr. Rulon Abide  INDICATIONS: 22 y.o. [id number] with retained products, needing surgical completion.  Risks of surgery were discussed with the patient including but not limited to: bleeding which may require transfusion; infection which may require antibiotics; injury to uterus or surrounding organs;need for additional procedures including laparotomy or laparoscopy; possibility of intrauterine scarring which may impair future fertility; and other postoperative/anesthesia complications. Written informed consent was obtained.    FINDINGS:  A normal sized anteverted/midline/retroverted uterus, moderate amounts of products of conception, specimen sent to pathology.  ANESTHESIA:    Monitored intravenous sedation, paracervical block.  ESTIMATED BLOOD LOSS:  Less than 20 ml.  SPECIMENS:  Products of conception sent to pathology  COMPLICATIONS:  None immediate.  PROCEDURE DETAILS:  The patient received intravenous antibiotics while in the preoperative area.  She was then taken to the operating room where monitored intravenous sedation was administered and was found to be adequate.  After an adequate timeout was performed, she was placed in the dorsal lithotomy position and examined; then prepped and draped in the sterile manner.   Her bladder was catheterized for an unmeasured amount of clear, yellow urine. A vaginal speculum was then placed in the patient's vagina and a single tooth tenaculum was applied to the anterior lip of the cervix.  A paracervical block using 20 ml of  1% Lidocaine with was administered. The cervix was gently dilated to accommodate a 9 mm suction curette that was gently advanced to the uterine fundus.  The suction device was then activated and curette slowly  rotated to clear the uterus of products of conception.  A sharp curettage was then performed to confirm complete emptying of the uterus. There was minimal bleeding noted and the tenaculum removed with good hemostasis noted.   All instruments were removed from the patient's vagina. The patient tolerated the procedure well and was taken to the recovery area awake, and in stable condition.  The patient will be discharged to home as per PACU criteria.  Routine postoperative instructions given.  She was prescribed Ibuprofen.  She will follow up in the clinic in 2-3 weeks for postoperative evaluation.   Discussed birth control methods with patient.  She would like to try depo provera.  She was educated on irregular bleeding associated with this medication.

## 2013-09-19 NOTE — MAU Provider Note (Signed)
Alexandria Fisher is a 67 y.[id number] @[redacted]w[redacted]d   Who presents today for vaginal bleeding.  No chief complaint on file.    SUBJECTIVE  HPI:  Pt had a spontaneous abortion around 10/30 Was given a dose of cytotec to take at home but pt did not do it Missed her 2 week f/u appointment also Bleeding was heavy for the first 3 days  Then slowed down but has never stopped.   3 days ago got heavier again Passing clots Has a foul odor to it   No unprotected sex in the last 1.57months.   No fevers, chills, nausea, vomiting, diarrhea, abd pain.    Past Medical History  Diagnosis Date  . Infection   . Depression    Past Surgical History  Procedure Laterality Date  . No past surgeries     History   Social History  . Marital Status: Single    Spouse Name: N/A    Number of Children: N/A  . Years of Education: N/A   Occupational History  . Not on file.   Social History Main Topics  . Smoking status: Current Some Day Smoker -- 0.50 packs/day    Types: Cigarettes    Last Attempt to Quit: 04/24/2010  . Smokeless tobacco: Never Used  . Alcohol Use: No  . Drug Use: No  . Sexual Activity: Yes    Birth Control/ Protection: None     Comment: MIRENA   Other Topics Concern  . Not on file   Social History Narrative  . No narrative on file   No current facility-administered medications on file prior to encounter.   No current outpatient prescriptions on file prior to encounter.   No Known Allergies  ROS: Pertinent items in HPI  OBJECTIVE Blood pressure 153/89, pulse 96, temperature 98.7 F (37.1 C), temperature source Oral, resp. rate 20, height 5\' 5"  (1.651 m), weight 98.431 kg (217 lb), last menstrual period 06/24/2013, unknown if currently breastfeeding.  GENERAL: Well-developed, well-nourished female in no acute distress.  HEENT: Normocephalic, good dentition HEART: normal rate RESP: normal effort ABDOMEN: Soft, nontender EXTREMITIES: Nontender, no edema NEURO: Alert  and oriented SPECULUM EXAM: NEFG, physiologic discharge, some blood and clot in the vagina with open cervix approx 1.5cm  BIMANUAL: cervix nontender; uterus ?slight enlargement although difficult to palpate with bimanual due to body habitus; no adnexal tenderness or masses   LAB RESULTS  Results for orders placed during the hospital encounter of 09/19/13 (from the past 24 hour(s))  HCG, QUANTITATIVE, PREGNANCY     Status: Abnormal   Collection Time    09/19/13  1:58 PM      Result Value Range   hCG, Beta Chain, Quant, S 26 (*) <5 mIU/mL    IMAGING  [place] 10/30- No intrauterine pregnancy visualized currently. Thickened,  heterogeneous endometrium could reflect blood products.  [place] 12/11- The endometrium within the lower uterine segment is heterogeneous and hypervascular. Findings are suggestive for retained products of conception.    ASSESSMENT  No diagnosis found. HCG of 26. Concerning for possible retained products with open os and persistent bleeding.  Hgb of 12.7   PLAN  US obtained and discussed with on call radiologist.  Exam showing heterogeneous tissue in the endometrial cavity most consistent with retained products.  Given duration of symptoms, current open os and concern for developing infection, discussed surgical management with D&E.   Case discussed with Dr. Penne Lash who will come down and see the patient.  Plan of care discussed with  patient who is in agreement for surgical management.       Medication List    Notice   You have not been prescribed any medications.     No Follow-up on file.     Vale Haven MD OB Fellow 09/19/2013 3:29 PM  Pt seen and examined.  Pt has an open os and is bleeding.  Will proceed with D & E. Patient was counseled regarding need for  D&E.  Risks of surgery including bleeding, infection, injury to surrounding organs, need for additional procedures, possibility of intrauterine scarring which may impair future fertility,  risk of retained products which may require further management and other postoperative/anesthesia complications were explained to patient.  Likelihood of success of complete evacuation of the uterus was discussed with the patient.  Written informed consent was obtained. Anesthesia and OR aware.  Preoperative prophylactic Doxycycline 100mg  IV  has been ordered and is on call to the OR.  To OR when ready.

## 2013-09-19 NOTE — Preoperative (Signed)
Beta Blockers   Reason not to administer Beta Blockers:Not Applicable 

## 2013-09-19 NOTE — Anesthesia Preprocedure Evaluation (Addendum)
Anesthesia Evaluation  Patient identified by MRN, date of birth, ID band Patient awake    Reviewed: Allergy & Precautions, H&P , NPO status , Patient's Chart, lab work & pertinent test results, reviewed documented beta blocker date and time   History of Anesthesia Complications Negative for: history of anesthetic complications  Airway Mallampati: I TM Distance: >3 FB Neck ROM: full    Dental  (+) Partial Upper   Pulmonary Current Smoker,  breath sounds clear to auscultation  Pulmonary exam normal       Cardiovascular negative cardio ROS  Rhythm:regular Rate:Normal     Neuro/Psych PSYCHIATRIC DISORDERS (depression) negative neurological ROS     GI/Hepatic negative GI ROS, Neg liver ROS,   Endo/Other  Obese - BMI 36  Renal/GU negative Renal ROS  negative genitourinary   Musculoskeletal   Abdominal Normal abdominal exam  (+)   Peds  Hematology negative hematology ROS (+)   Anesthesia Other Findings Last ate food at 0900, drink at 1100   Reproductive/Obstetrics (+) Pregnancy (missed ab 08/08/13, retained products)                          Anesthesia Physical Anesthesia Plan  ASA: II  Anesthesia Plan: MAC   Post-op Pain Management:    Induction:   Airway Management Planned:   Additional Equipment:   Intra-op Plan:   Post-operative Plan:   Informed Consent: I have reviewed the patients History and Physical, chart, labs and discussed the procedure including the risks, benefits and alternatives for the proposed anesthesia with the patient or authorized representative who has indicated his/her understanding and acceptance.     Plan Discussed with: Surgeon and CRNA  Anesthesia Plan Comments:         Anesthesia Quick Evaluation

## 2013-09-23 ENCOUNTER — Encounter (HOSPITAL_COMMUNITY): Payer: Self-pay | Admitting: Obstetrics & Gynecology

## 2013-10-24 ENCOUNTER — Encounter: Payer: Self-pay | Admitting: Obstetrics & Gynecology

## 2013-10-24 ENCOUNTER — Ambulatory Visit (INDEPENDENT_AMBULATORY_CARE_PROVIDER_SITE_OTHER): Payer: Self-pay | Admitting: Obstetrics & Gynecology

## 2013-10-24 VITALS — BP 140/88 | HR 85 | Temp 98.3°F | Ht 66.0 in | Wt 216.7 lb

## 2013-10-24 DIAGNOSIS — O99210 Obesity complicating pregnancy, unspecified trimester: Secondary | ICD-10-CM | POA: Insufficient documentation

## 2013-10-24 DIAGNOSIS — Z09 Encounter for follow-up examination after completed treatment for conditions other than malignant neoplasm: Secondary | ICD-10-CM

## 2013-10-24 DIAGNOSIS — E669 Obesity, unspecified: Secondary | ICD-10-CM

## 2013-10-24 DIAGNOSIS — I10 Essential (primary) hypertension: Secondary | ICD-10-CM

## 2013-10-24 DIAGNOSIS — Z9889 Other specified postprocedural states: Secondary | ICD-10-CM

## 2013-10-24 HISTORY — DX: Essential (primary) hypertension: I10

## 2013-10-24 LAB — POCT PREGNANCY, URINE: Preg Test, Ur: NEGATIVE

## 2013-10-24 MED ORDER — MEDROXYPROGESTERONE ACETATE 150 MG/ML IM SUSP: 150.0000 mg | INTRAMUSCULAR | Status: DC

## 2013-10-24 NOTE — Progress Notes (Signed)
   Subjective:    Patient ID: Alexandria Fisher, female    DOB: Jul 25, 1991, 23 y.o.   MRN: [id number]  HPI  Pt is s/p D&E for retained POC.  UPT negative today.  Path showed POC.   Pt had menstrual period 10/13/13-10/17/13.  Pt began spotting again yesterday.   Pt has a headache today (given tylenol in our office)  Review of Systems  Constitutional: Negative.   Respiratory: Negative for shortness of breath.   Cardiovascular: Negative for chest pain.  Gastrointestinal: Negative for abdominal distention.  Genitourinary: Positive for vaginal bleeding. Negative for pelvic pain.  Neurological: Positive for headaches.       Objective:   Physical Exam  Vitals reviewed. Constitutional: She appears well-developed and well-nourished.  HENT:  Head: Normocephalic and atraumatic.  Pulmonary/Chest: Effort normal.  Abdominal: Soft. She exhibits no distension and no mass. There is no tenderness. There is no rebound and no guarding.  Genitourinary: Uterus normal. Vaginal discharge found.  Skin: Skin is warm and dry.  Psychiatric: She has a normal mood and affect. Her behavior is normal.          Assessment & Plan:  23 yo female for check up after D & E for retained POC  1-tylenol for headache 2-BP 140/88; follow up with primary care  3-Pt needs depo provera.  It was ordered in the PACU but not given.  Pt had to leave b/c her ride was here.  She will come back for her depo

## 2013-10-28 ENCOUNTER — Encounter: Payer: Self-pay | Admitting: General Practice

## 2013-10-28 ENCOUNTER — Telehealth: Payer: Self-pay | Admitting: General Practice

## 2013-10-28 NOTE — Telephone Encounter (Signed)
Called patient at mobile number and a message stated this number is invalid. Also called patient at relative's phone number and sister's phone number but both had been disconnected/invalid. Will send patient a letter.

## 2013-10-28 NOTE — Telephone Encounter (Signed)
Message copied by Shelly Coss on Mon Oct 28, 2013  1:24 PM ------      Message from: Guss Bunde      Created: Sun Oct 27, 2013  1:17 PM       Pt did not receive depo provera before discharge from the hospital at time of D & C..  Pt could not wait for depo on Thursday (and all clinic staff had left the clinic).            Please call pt and have her come in for a depo.  Will need pregnancy tests most likely.               ------

## 2013-11-18 ENCOUNTER — Encounter: Payer: Self-pay | Admitting: *Deleted

## 2013-11-22 ENCOUNTER — Encounter: Payer: Self-pay | Admitting: *Deleted

## 2014-04-22 ENCOUNTER — Encounter (HOSPITAL_COMMUNITY): Payer: Self-pay | Admitting: Emergency Medicine

## 2014-04-22 ENCOUNTER — Emergency Department (INDEPENDENT_AMBULATORY_CARE_PROVIDER_SITE_OTHER): Payer: Self-pay

## 2014-04-22 ENCOUNTER — Emergency Department (INDEPENDENT_AMBULATORY_CARE_PROVIDER_SITE_OTHER)
Admission: EM | Admit: 2014-04-22 | Discharge: 2014-04-22 | Disposition: A | Discharge status: Home / Self Care | Payer: Self-pay | Source: Home / Self Care | Attending: Emergency Medicine | Admitting: Emergency Medicine

## 2014-04-22 DIAGNOSIS — M79609 Pain in unspecified limb: Secondary | ICD-10-CM

## 2014-04-22 DIAGNOSIS — M79672 Pain in left foot: Secondary | ICD-10-CM

## 2014-04-22 LAB — CBC WITH DIFFERENTIAL/PLATELET
Basophils Absolute: 0.1 10*3/uL (ref 0.0–0.1)
Basophils Relative: 1 % (ref 0–1)
EOS ABS: 0.1 10*3/uL (ref 0.0–0.7)
EOS PCT: 2 % (ref 0–5)
HEMATOCRIT: 41.3 % (ref 36.0–46.0)
HEMOGLOBIN: 14.1 g/dL (ref 12.0–15.0)
Lymphocytes Relative: 25 % (ref 12–46)
Lymphs Abs: 2.4 10*3/uL (ref 0.7–4.0)
MCH: 29.4 pg (ref 26.0–34.0)
MCHC: 34.1 g/dL (ref 30.0–36.0)
MCV: 86 fL (ref 78.0–100.0)
MONO ABS: 0.5 10*3/uL (ref 0.1–1.0)
MONOS PCT: 5 % (ref 3–12)
Neutro Abs: 6.6 10*3/uL (ref 1.7–7.7)
Neutrophils Relative %: 67 % (ref 43–77)
Platelets: 212 10*3/uL (ref 150–400)
RBC: 4.8 MIL/uL (ref 3.87–5.11)
RDW: 12.8 % (ref 11.5–15.5)
WBC: 9.6 10*3/uL (ref 4.0–10.5)

## 2014-04-22 LAB — D-DIMER, QUANTITATIVE (NOT AT ARMC): D DIMER QUANT: 0.55 ug{FEU}/mL — AB (ref 0.00–0.48)

## 2014-04-22 LAB — URIC ACID: Uric Acid, Serum: 5.3 mg/dL (ref 2.4–7.0)

## 2014-04-22 MED ORDER — PREDNISONE 20 MG PO TABS: ORAL_TABLET | ORAL | Status: DC

## 2014-04-22 MED ORDER — ENOXAPARIN SODIUM 80 MG/0.8ML ~~LOC~~ SOLN
80.0000 mg | Freq: Once | SUBCUTANEOUS | Status: AC
  Administered 2014-04-22: 80 mg via SUBCUTANEOUS
  Filled 2014-04-22: qty 0.8

## 2014-04-22 MED ORDER — HYDROCODONE-ACETAMINOPHEN 5-325 MG PO TABS
ORAL_TABLET | ORAL | Status: AC
  Filled 2014-04-22: qty 2

## 2014-04-22 MED ORDER — DICLOFENAC SODIUM 75 MG PO TBEC: 75.0000 mg | DELAYED_RELEASE_TABLET | Freq: Two times a day (BID) | ORAL | Status: DC

## 2014-04-22 MED ORDER — OXYCODONE-ACETAMINOPHEN 5-325 MG PO TABS: ORAL_TABLET | ORAL | Status: DC

## 2014-04-22 MED ORDER — HYDROCODONE-ACETAMINOPHEN 5-325 MG PO TABS
2.0000 | ORAL_TABLET | Freq: Once | ORAL | Status: AC
  Administered 2014-04-22: 2 via ORAL

## 2014-04-22 MED ORDER — ENOXAPARIN SODIUM 80 MG/0.8ML ~~LOC~~ SOLN: 1.0000 mg/kg | Freq: Once | SUBCUTANEOUS | Status: DC

## 2014-04-22 NOTE — ED Provider Notes (Signed)
Chief Complaint   Chief Complaint  Patient presents with  . Foot Pain    History of Present Illness   [place] is a 23 year old female who has had a one-week history of pain that begins over the dorsum of the left foot radiates up into the ankle and into the mid pretibial area. The ankle is puffy and swollen. It hurts to touch it hurts to walk. The foot tingles and the leg feels weak. Her only other complaint has been frequent headaches. She denies chest pain or shortness of breath. There's been no injury to the foot as the ankle. She's had no fever or chills. No history of gout and no other joints have been bothering her.  Review of Systems   Other than as noted above, the patient denies any of the following symptoms: Systemic:  No fever, chills, weight gain or loss. Respiratory:  No coughing, wheezing, or shortness of breath. Cardiac:  No chest pain, tightness, pressure or syncope. GI:  No abdominal pain, swelling, distension, nausea, or vomiting. GU:  No dysuria, frequency, or hematuria. Ext:  No joint pain or muscle pain.  [place]   Past medical history, family history, social history, meds, and allergies were reviewed.    Physical Examination     Vital signs:  BP 126/93  Pulse 91  Temp(Src) 98.3 F (36.8 C) (Oral)  Resp 16  SpO2 100%  LMP 04/10/2014 Gen:  Alert, oriented, in no distress. Neck:  No tenderness, adenopathy, or JVD. Lungs:  Breath sounds clear and equal bilaterally.  No rales, rhonchi or wheezes. Heart:  Regular rhythm, no gallops or murmers. Abdomen:  Soft, nontender, no organomegaly or mass. Ext:  The foot appears a little bit puffy but there is no pitting edema. She has diffuse tenderness over the dorsum of the foot, the ankle, and the pretibial area. There no distended veins or varicose veins. There is no calf tenderness and Homans sign is negative. It hurts to move her ankle. There's no tenderness over the Achilles or the heel and no tenderness  to the bottom of the foot. Pulses are full, she has good capillary refill. Neuro:  Alert and oriented times 3.  No muscle weakness.  Sensation intact to light touch. Skin:  Warm and dry.  No rash or skin lesions.  Labs   Results for orders placed during the hospital encounter of 04/22/14  CBC WITH DIFFERENTIAL      Result Value Ref Range   WBC 9.6  4.0 - 10.5 K/uL   RBC 4.80  3.87 - 5.11 MIL/uL   Hemoglobin 14.1  12.0 - 15.0 g/dL   HCT 41.3  36.0 - 46.0 %   MCV 86.0  78.0 - 100.0 fL   MCH 29.4  26.0 - 34.0 pg   MCHC 34.1  30.0 - 36.0 g/dL   RDW 12.8  11.5 - 15.5 %   Platelets 212  150 - 400 K/uL   Neutrophils Relative % 67  43 - 77 %   Neutro Abs 6.6  1.7 - 7.7 K/uL   Lymphocytes Relative 25  12 - 46 %   Lymphs Abs 2.4  0.7 - 4.0 K/uL   Monocytes Relative 5  3 - 12 %   Monocytes Absolute 0.5  0.1 - 1.0 K/uL   Eosinophils Relative 2  0 - 5 %   Eosinophils Absolute 0.1  0.0 - 0.7 K/uL   Basophils Relative 1  0 - 1 %   Basophils Absolute  0.1  0.0 - 0.1 K/uL  D-DIMER, QUANTITATIVE      Result Value Ref Range   D-Dimer, Quant 0.55 (*) 0.00 - 0.48 ug/mL-FEU  URIC ACID      Result Value Ref Range   Uric Acid, Serum 5.3  2.4 - 7.0 mg/dL     Radiology   Dg Ankle Complete Left  04/22/2014   CLINICAL DATA:  Left foot and ankle pain, no known injury.  EXAM: LEFT ANKLE COMPLETE - 3+ VIEW  COMPARISON:  04/22/2014 foot radiographs  FINDINGS: Ankle mortise intact. No displaced fracture or dislocation. No aggressive osseous lesion or overt degenerative change.  IMPRESSION: No acute or aggressive osseous finding of the left ankle.   Electronically Signed   By: Carlos Levering M.D.   On: 04/22/2014 17:44   Dg Foot Complete Left  04/22/2014   CLINICAL DATA:  LEFT foot and ankle pain for 1 week, no known injury  EXAM: LEFT FOOT - COMPLETE 3+ VIEW  COMPARISON:  None  FINDINGS: Osseous mineralization normal.  Joint spaces preserved.  No acute fracture, dislocation or bone destruction.  Question  minimal dorsal soft tissue swelling overlying metatarsals.  IMPRESSION: No acute osseous abnormalities.   Electronically Signed   By: Lavonia Dana M.D.   On: 04/22/2014 17:39    I reviewed the images independently and personally and concur with the radiologist's findings.  Course in [facility] [place]   Given Lovenox 1 mg per kilogram and a single subcutaneous dose. She was placed in a [facility].  Assessment   The encounter diagnosis was Foot pain, left.  Differential diagnosis is tendinitis, arthritis, or stress fracture. Infection and gout or less likely, given normal CBC and uric acid. Her d-dimer is minimally elevated, therefore she warrants a venous Doppler. This has been set up for tomorrow morning. She's to come over here afterwards. If her Doppler is negative, she can go ahead and start on medications as below.  Plan   1.  Meds:  The following meds were prescribed:   Discharge Medication List as of 04/22/2014  6:37 PM    START taking these medications   Details  diclofenac (VOLTAREN) 75 MG EC tablet Take 1 tablet (75 mg total) by mouth 2 (two) times daily., Starting 04/22/2014, Until Discontinued, Normal    oxyCODONE-acetaminophen (PERCOCET) 5-325 MG per tablet 1 to 2 tablets every 6 hours as needed for pain., Print    predniSONE (DELTASONE) 20 MG tablet Take 3 daily for 5 days, 2 daily for 5 days, 1 daily for 5 days., Normal        2.  Patient Education/Counseling:  The patient was given appropriate handouts, self care instructions, and instructed in symptomatic relief.  If her Doppler is negative, and if she is no better in one week, suggested followup with orthopedics.  3.  Follow up:  The patient was told to follow up here if no better in 3 to 4 days, or sooner if becoming worse in any way, and given some red flag symptoms such as worsening swelling, chest pain, shortness of breath or fever which would prompt immediate return.       Harden Mo, MD 04/22/14  [id number]

## 2014-04-22 NOTE — ED Notes (Signed)
C/o left foot pain States foot is swollen and hurts on movement No tx tried

## 2014-04-22 NOTE — Discharge Instructions (Signed)
Arthritis, Nonspecific Arthritis is inflammation of a joint. This usually means pain, redness, warmth or swelling are present. One or more joints may be involved. There are a number of types of arthritis. Your caregiver may not be able to tell what type of arthritis you have right away. CAUSES  The most common cause of arthritis is the wear and tear on the joint (osteoarthritis). This causes damage to the cartilage, which can break down over time. The knees, hips, back and neck are most often affected by this type of arthritis. Other types of arthritis and common causes of joint pain include: Sprains and other injuries near the joint. Sometimes minor sprains and injuries cause pain and swelling that develop hours later. Rheumatoid arthritis. This affects hands, feet and knees. It usually affects both sides of your body at the same time. It is often associated with chronic ailments, fever, weight loss and general weakness. Crystal arthritis. Gout and pseudo gout can cause occasional acute severe pain, redness and swelling in the foot, ankle, or knee. Infectious arthritis. Bacteria can get into a joint through a break in overlying skin. This can cause infection of the joint. Bacteria and viruses can also spread through the blood and affect your joints. Drug, infectious and allergy reactions. Sometimes joints can become mildly painful and slightly swollen with these types of illnesses. SYMPTOMS  Pain is the main symptom. Your joint or joints can also be red, swollen and warm or hot to the touch. You may have a fever with certain types of arthritis, or even feel overall ill. The joint with arthritis will hurt with movement. Stiffness is present with some types of arthritis. DIAGNOSIS  Your caregiver will suspect arthritis based on your description of your symptoms and on your exam. Testing may be needed to find the type of arthritis: Blood and sometimes urine tests. X-ray tests and sometimes CT or MRI  scans. Removal of fluid from the joint (arthrocentesis) is done to check for bacteria, crystals or other causes. Your caregiver (or a specialist) will numb the area over the joint with a local anesthetic, and use a needle to remove joint fluid for examination. This procedure is only minimally uncomfortable. Even with these tests, your caregiver may not be able to tell what kind of arthritis you have. Consultation with a specialist (rheumatologist) may be helpful. TREATMENT  Your caregiver will discuss with you treatment specific to your type of arthritis. If the specific type cannot be determined, then the following general recommendations may apply. Treatment of severe joint pain includes: Rest. Elevation. Anti-inflammatory medication (for example, ibuprofen) may be prescribed. Avoiding activities that cause increased pain. Only take over-the-counter or prescription medicines for pain and discomfort as recommended by your caregiver. Cold packs over an inflamed joint may be used for 10 to 15 minutes every hour. Hot packs sometimes feel better, but do not use overnight. Do not use hot packs if you are diabetic without your caregiver's permission. A cortisone shot into arthritic joints may help reduce pain and swelling. Any acute arthritis that gets worse over the next 1 to 2 days needs to be looked at to be sure there is no joint infection. Long-term arthritis treatment involves modifying activities and lifestyle to reduce joint stress jarring. This can include weight loss. Also, exercise is needed to nourish the joint cartilage and remove waste. This helps keep the muscles around the joint strong. HOME CARE INSTRUCTIONS  Do not take aspirin to relieve pain if gout is suspected. This  elevates uric acid levels. Only take over-the-counter or prescription medicines for pain, discomfort or fever as directed by your caregiver. Rest the joint as much as possible. If your joint is swollen, keep it  elevated. Use crutches if the painful joint is in your leg. Drinking plenty of fluids may help for certain types of arthritis. Follow your caregiver's dietary instructions. Try low-impact exercise such as: Swimming. Water aerobics. Biking. Walking. Morning stiffness is often relieved by a warm shower. Put your joints through regular range-of-motion. SEEK MEDICAL CARE IF:  You do not feel better in 24 hours or are getting worse. You have side effects to medications, or are not getting better with treatment. SEEK IMMEDIATE MEDICAL CARE IF:  You have a fever. You develop severe joint pain, swelling or redness. Many joints are involved and become painful and swollen. There is severe back pain and/or leg weakness. You have loss of bowel or bladder control. Document Released: 11/03/2004 Document Revised: 12/19/2011 Document Reviewed: 11/19/2008 [employer] Patient Information 2015 [place], [place]. This information is not intended to replace advice given to you by your health care provider. Make sure you discuss any questions you have with your health care provider. Tendinitis Tendinitis is swelling and inflammation of the tendons. Tendons are band-like tissues that connect muscle to bone. Tendinitis commonly occurs in the:   Shoulders (rotator cuff).  Heels (Achilles tendon).  Elbows (triceps tendon). CAUSES Tendinitis is usually caused by overusing the tendon, muscles, and joints involved. When the tissue surrounding a tendon (synovium) becomes inflamed, it is called tenosynovitis. Tendinitis commonly develops in people whose jobs require repetitive motions. SYMPTOMS  Pain.  Tenderness.  Mild swelling. DIAGNOSIS Tendinitis is usually diagnosed by physical exam. Your health care provider may also order X-rays or other imaging tests. TREATMENT Your health care provider may recommend certain medicines or exercises for your treatment. HOME CARE INSTRUCTIONS   Use a sling or splint  for as long as directed by your health care provider until the pain decreases.  Put ice on the injured area.  Put ice in a plastic bag.  Place a towel between your skin and the bag.  Leave the ice on for 15-20 minutes, 3-4 times a day, or as directed by your health care provider.  Avoid using the limb while the tendon is painful. Perform gentle range of motion exercises only as directed by your health care provider. Stop exercises if pain or discomfort increase, unless directed otherwise by your health care provider.  Only take over-the-counter or prescription medicines for pain, discomfort, or fever as directed by your health care provider. SEEK MEDICAL CARE IF:   Your pain and swelling increase.  You develop new, unexplained symptoms, especially increased numbness in the hands. MAKE SURE YOU:   Understand these instructions.  Will watch your condition.  Will get help right away if you are not doing well or get worse. Document Released: 09/23/2000 Document Revised: 10/01/2013 Document Reviewed: 12/13/2010 [facility] Patient Information 2015 [place], [place]. This information is not intended to replace advice given to you by your health care provider. Make sure you discuss any questions you have with your health care provider.  Stress Fracture When too much stress is put on the foot, as may occur in running and jumping sports, the lengthy shafts of the bones of the forefoot become susceptible to breaking due to repetitive stress (stress fracture) because of thinness of these bone. A stress fracture is more common if osteoporosis is present or if inadequate athletic footwear  is used. Shoes should be used which adequately support the sole of the foot to absorb the shocks of the activity participated in. Stress fractures are very common in competitive female runners who develop these small cracks on the surface of the bones in their legs and feet. The women most likely to suffer these injuries  are those who restrict food intake and those who have irregular periods. Stress fractures usually start out as a minor discomfort in the foot or leg. The completion of fracture due to repetitive loading often occurs near the end of a long run. The pain may dissipate with rest. With the next exercise session, the pain may return earlier in the run. If an athlete notices that it hurts to touch just one spot on a bone and then stops running for a week, he or she may be tempted to return to running too soon. Often the pain is ignored in order to continue with high impact exercise. A stress fracture then develops. The athlete now has to avoid the hard pounding of running, but can ride a bike or swim for exercise once the pain has resolved with normal weight bearing until the fracture heals in 6-12 weeks. The most common sites for stress fractures are the bones in the front of the feet (metatarsals) and the long bone of the lower leg (tibia), but running can cause stress fractures anywhere in the lower extremities or pelvis. DIAGNOSIS  Usually the diagnosis is made by reviewing the patient's history. The bone involved progressively becomes more painful with activities. X-rays may show no break within the first 2-3 weeks that pain begins. A later X-ray may show signs that the bone is healing. Having a bone scan or MRI usually makes an earlier diagnosis possible. HOME CARE INSTRUCTIONS  Treatment may include a cast or walking shoe.  High impact activities should be stopped until advised by your caregiver.  Wear shoes with adequate shock absorbing abilities and good support of the sole of the foot. This is especially important in the arch of the foot.  Alternative exercise may be undertaken while waiting for healing. This may include bicycling and swimming. If you do not have a cast or splint:  You may walk on your injured foot as tolerated or advised.  Do not put any weight on your injured foot until  instructed. Slowly increase the amount of time you walk on the foot as the pain allows or as advised.  Use crutches until you can bear weight without pain. A gradual increase in weight bearing may help.  Apply ice to the injured area for the first 2 days after you have been treated or as directed by your caregiver.  Put ice in a plastic bag.  Place a towel between your skin and the bag.  Leave the ice on for 15-20 minutes at a time, every hour while you are awake.  Only take over-the-counter or prescription medicines for pain or discomfort as directed by your caregiver.  If your caregiver has given you a follow-up appointment, it is very important to keep that appointment. Not keeping the appointment could result in a chronic or permanent injury, pain, and disability. SEEK IMMEDIATE MEDICAL CARE IF:   Pain is becoming worse rather than better.  Pain is uncontrolled with medicine.  You have increased swelling or redness in the foot.  The feeling in the foot or leg is diminished. MAKE SURE YOU:   Understand these instructions.  Will watch your condition.  Will get help right away if you are not doing well or get worse. Document Released: 12/17/2002 Document Revised: 01/21/2013 Document Reviewed: 05/12/2008 [employer] Patient Information 2015 [place], [place]. This information is not intended to replace advice given to you by your health care provider. Make sure you discuss any questions you have with your health care provider.

## 2014-04-23 ENCOUNTER — Ambulatory Visit (HOSPITAL_COMMUNITY)
Admission: RE | Admit: 2014-04-23 | Discharge: 2014-04-23 | Disposition: A | Discharge status: Home / Self Care | Payer: Self-pay | Source: Ambulatory Visit | Attending: Emergency Medicine | Admitting: Emergency Medicine

## 2014-04-23 ENCOUNTER — Encounter (HOSPITAL_COMMUNITY): Payer: Self-pay | Admitting: Emergency Medicine

## 2014-04-23 ENCOUNTER — Emergency Department (INDEPENDENT_AMBULATORY_CARE_PROVIDER_SITE_OTHER)
Admission: EM | Admit: 2014-04-23 | Discharge: 2014-04-23 | Disposition: A | Discharge status: Home / Self Care | Payer: Self-pay | Source: Home / Self Care | Attending: Family Medicine | Admitting: Family Medicine

## 2014-04-23 DIAGNOSIS — M79672 Pain in left foot: Secondary | ICD-10-CM

## 2014-04-23 DIAGNOSIS — M79609 Pain in unspecified limb: Secondary | ICD-10-CM

## 2014-04-23 DIAGNOSIS — M7989 Other specified soft tissue disorders: Secondary | ICD-10-CM | POA: Insufficient documentation

## 2014-04-23 MED ORDER — IBUPROFEN 800 MG PO TABS
800.0000 mg | ORAL_TABLET | Freq: Once | ORAL | Status: DC
Start: 2014-04-23 — End: 2014-04-23

## 2014-04-23 NOTE — Progress Notes (Signed)
*  Preliminary Results* Left lower extremity venous duplex completed. Left lower extremity is negative for deep vein thrombosis. There is no evidence of left Baker's cyst.  04/23/2014 11:21 AM  Maudry Mayhew, RVT, RDCS, RDMS

## 2014-04-23 NOTE — ED Notes (Signed)
Here for review of reports; c/o continued pain in her foot. Has not filled Rx from yesterday. ADvised she wil have to fill her Rx for pain, no Rx dispensed from this facillity

## 2014-04-23 NOTE — Discharge Instructions (Signed)
Your ultrasound of your left leg was without evidence of a blood clot. Please keep leg elevated when possible, apply ice to reduce any swelling, wear boot splint as needed for comfort, and take medications as directed. If symptoms do not improve with these therapies over the week, please contact the [facility] for further evaluation.

## 2014-04-23 NOTE — ED Provider Notes (Signed)
CSN: [id number]     Arrival date & time 04/23/14  1036 History   First MD Initiated Contact with Patient 04/23/14 1041     Chief Complaint  Patient presents with  . Foot Pain   (Consider location/radiation/quality/duration/timing/severity/associated sxs/prior Treatment) HPI Comments: Patient was seen here yesterday for a one week history of left foot pain. Was instructed to report for venous duplex of LLE this morning and now returns to [facility] as instructed for her study results. States she has not yet filled the prescriptions given to her yesterday and is still having pain. No new symptoms.  Patient is a 23 y.o. female presenting with lower extremity pain. The history is provided by the patient.  Foot Pain    Past Medical History  Diagnosis Date  . Infection   . Depression    Past Surgical History  Procedure Laterality Date  . No past surgeries    . Dilation and evacuation N/A 09/19/2013    Procedure: DILATATION AND EVACUATION;  Surgeon: Guss Bunde, MD;  Location: [facility];  Service: Gynecology;  Laterality: N/A;   Family History  Problem Relation Age of Onset  . Breast cancer Maternal Grandmother    History  Substance Use Topics  . Smoking status: Former Smoker -- 0.50 packs/day    Types: Cigarettes    Quit date: 04/24/2010  . Smokeless tobacco: Never Used  . Alcohol Use: No   OB History   Grav Para Term Preterm Abortions TAB SAB Ect Mult Living   4 2 2  1  1   2      Review of Systems  All other systems reviewed and are negative.   Allergies  Review of patient's allergies indicates no known allergies.  Home Medications   Prior to Admission medications   Medication Sig Start Date End Date Taking? Authorizing Provider  diclofenac (VOLTAREN) 75 MG EC tablet Take 1 tablet (75 mg total) by mouth 2 (two) times daily. 04/22/14   Harden Mo, MD  ibuprofen (ADVIL,MOTRIN) 600 MG tablet Take 1 tablet (600 mg total) by mouth every 6 (six) hours as needed for cramping.  09/19/13   Guss Bunde, MD  oxyCODONE-acetaminophen (PERCOCET) 5-325 MG per tablet 1 to 2 tablets every 6 hours as needed for pain. 04/22/14   Harden Mo, MD  predniSONE (DELTASONE) 20 MG tablet Take 3 daily for 5 days, 2 daily for 5 days, 1 daily for 5 days. 04/22/14   Harden Mo, MD   BP 122/71  Pulse 90  Temp(Src) 98.4 F (36.9 C) (Oral)  Resp 16  SpO2 100%  LMP 04/10/2014 Physical Exam  Nursing note and vitals reviewed. Constitutional: She is oriented to person, place, and time. She appears well-developed and well-nourished. No distress.  HENT:  Head: Normocephalic and atraumatic.  Eyes: Conjunctivae are normal. No scleral icterus.  Cardiovascular: Normal rate.   Pulmonary/Chest: Effort normal.  Musculoskeletal: Normal range of motion.  +tenderness across dorsum of left foot CSM exam of left foot and ankle normal  Neurological: She is alert and oriented to person, place, and time.  Skin: Skin is warm and dry. No rash noted. No erythema.  Psychiatric: She has a normal mood and affect. Her behavior is normal.    ED Course  Procedures (including critical care time) Labs Review Labs Reviewed - No data to display  Imaging Review Dg Ankle Complete Left  04/22/2014   CLINICAL DATA:  Left foot and ankle pain, no known injury.  EXAM:  LEFT ANKLE COMPLETE - 3+ VIEW  COMPARISON:  04/22/2014 foot radiographs  FINDINGS: Ankle mortise intact. No displaced fracture or dislocation. No aggressive osseous lesion or overt degenerative change.  IMPRESSION: No acute or aggressive osseous finding of the left ankle.   Electronically Signed   By: Carlos Levering M.D.   On: 04/22/2014 17:44   Dg Foot Complete Left  04/22/2014   CLINICAL DATA:  LEFT foot and ankle pain for 1 week, no known injury  EXAM: LEFT FOOT - COMPLETE 3+ VIEW  COMPARISON:  None  FINDINGS: Osseous mineralization normal.  Joint spaces preserved.  No acute fracture, dislocation or bone destruction.  Question minimal  dorsal soft tissue swelling overlying metatarsals.  IMPRESSION: No acute osseous abnormalities.   Electronically Signed   By: Lavonia Dana M.D.   On: 04/22/2014 17:39     MDM   1. Left foot pain    Contacted vascular lab and study reported verbally to be negative for DVT of LLE. Patient advised, per Dr. Viann Shove notes from yesterday's visit, to fill and begin taking medications prescribed. Patient also educated regarding use of CAM walker and referred to [place] of above therapies do not bring improvement. RICE therapy also recommended. Advised not to sleep in CAM Walker. Provided 800 mg oral ibuprofen at [facility] for pain   [place], [place] 04/23/14 1130  Medical screening examination/treatment/procedure(s) were performed by a resident physician or non-physician practitioner and as the supervising physician I was immediately available for consultation/collaboration.  Lynne Leader, MD   Gregor Hams, MD 04/24/14 [phone]

## 2014-05-02 ENCOUNTER — Encounter (HOSPITAL_COMMUNITY): Payer: Self-pay | Admitting: Emergency Medicine

## 2014-05-02 ENCOUNTER — Emergency Department (HOSPITAL_COMMUNITY)
Admission: EM | Admit: 2014-05-02 | Discharge: 2014-05-02 | Disposition: A | Discharge status: Home / Self Care | Payer: Self-pay | Attending: Emergency Medicine | Admitting: Emergency Medicine

## 2014-05-02 DIAGNOSIS — F329 Major depressive disorder, single episode, unspecified: Secondary | ICD-10-CM | POA: Insufficient documentation

## 2014-05-02 DIAGNOSIS — Z791 Long term (current) use of non-steroidal anti-inflammatories (NSAID): Secondary | ICD-10-CM | POA: Insufficient documentation

## 2014-05-02 DIAGNOSIS — M25579 Pain in unspecified ankle and joints of unspecified foot: Secondary | ICD-10-CM | POA: Insufficient documentation

## 2014-05-02 DIAGNOSIS — M25473 Effusion, unspecified ankle: Secondary | ICD-10-CM | POA: Insufficient documentation

## 2014-05-02 DIAGNOSIS — Z87891 Personal history of nicotine dependence: Secondary | ICD-10-CM | POA: Insufficient documentation

## 2014-05-02 DIAGNOSIS — F3289 Other specified depressive episodes: Secondary | ICD-10-CM | POA: Insufficient documentation

## 2014-05-02 DIAGNOSIS — Z8619 Personal history of other infectious and parasitic diseases: Secondary | ICD-10-CM | POA: Insufficient documentation

## 2014-05-02 DIAGNOSIS — M79672 Pain in left foot: Secondary | ICD-10-CM

## 2014-05-02 DIAGNOSIS — M25476 Effusion, unspecified foot: Secondary | ICD-10-CM | POA: Insufficient documentation

## 2014-05-02 MED ORDER — TRAMADOL HCL 50 MG PO TABS
50.0000 mg | ORAL_TABLET | Freq: Once | ORAL | Status: DC
  Filled 2014-05-02: qty 1

## 2014-05-02 MED ORDER — MELOXICAM 7.5 MG PO TABS: 15.0000 mg | ORAL_TABLET | Freq: Every day | ORAL | Status: DC

## 2014-05-02 MED ORDER — TRAMADOL HCL 50 MG PO TABS: 50.0000 mg | ORAL_TABLET | Freq: Four times a day (QID) | ORAL | Status: DC | PRN

## 2014-05-02 MED ORDER — PREDNISONE 20 MG PO TABS
60.0000 mg | ORAL_TABLET | Freq: Once | ORAL | Status: AC
  Administered 2014-05-02: 60 mg via ORAL
  Filled 2014-05-02: qty 3

## 2014-05-02 NOTE — Progress Notes (Signed)
[facility] provided pt with a list of primary care resources. Patient stated pending Medicaid.

## 2014-05-02 NOTE — ED Provider Notes (Signed)
CSN: [id number]     Arrival date & time 05/02/14  1104 History   First MD Initiated Contact with Patient 05/02/14 1116     Chief Complaint  Patient presents with  . Leg Swelling  . Leg Pain     (Consider location/radiation/quality/duration/timing/severity/associated sxs/prior Treatment) HPI Comments: Patient is a 23 yo F PMHx significant for depression presenting to the ED for three weeks of intermittent episodes of left calf and foot swelling. Patient was seen and evaluated at [facility] on 7/15, at which time she had negative x-rays and a negative venous duplex to evaluate for a DVT. Patient states she has been wearing the Cam Walker boot with relief of symptoms, but finds no relief from medications prescribed. She has not followed up with the orthopedist as advised. Denies any fevers, chills, numbness.    Past Medical History  Diagnosis Date  . Infection   . Depression    Past Surgical History  Procedure Laterality Date  . No past surgeries    . Dilation and evacuation N/A 09/19/2013    Procedure: DILATATION AND EVACUATION;  Surgeon: Guss Bunde, MD;  Location: [facility];  Service: Gynecology;  Laterality: N/A;   Family History  Problem Relation Age of Onset  . Breast cancer Maternal Grandmother    History  Substance Use Topics  . Smoking status: Former Smoker -- 0.50 packs/day    Types: Cigarettes    Quit date: 04/24/2010  . Smokeless tobacco: Never Used  . Alcohol Use: No   OB History   Grav Para Term Preterm Abortions TAB SAB Ect Mult Living   4 2 2  1  1   2      Review of Systems  Constitutional: Negative for fever and chills.  Cardiovascular: Positive for leg swelling.  Musculoskeletal: Positive for joint swelling.  Skin: Negative for color change.  All other systems reviewed and are negative.     Allergies  Review of patient's allergies indicates no known allergies.  Home Medications   Prior to Admission medications   Medication Sig Start Date End Date  Taking? Authorizing Provider  diclofenac (VOLTAREN) 75 MG EC tablet Take 1 tablet (75 mg total) by mouth 2 (two) times daily. 04/22/14  Yes Harden Mo, MD  ibuprofen (ADVIL,MOTRIN) 600 MG tablet Take 1 tablet (600 mg total) by mouth every 6 (six) hours as needed for cramping. 09/19/13  Yes Guss Bunde, MD  oxyCODONE-acetaminophen (PERCOCET) 5-325 MG per tablet 1 to 2 tablets every 6 hours as needed for pain. 04/22/14  Yes Harden Mo, MD  meloxicam (MOBIC) 7.5 MG tablet Take 2 tablets (15 mg total) by mouth daily. 05/02/14   Katryn Plummer L Danessa Mensch, PA-C  traMADol (ULTRAM) 50 MG tablet Take 1 tablet (50 mg total) by mouth every 6 (six) hours as needed. 05/02/14   Allyana Vogan L Harlyn Italiano, PA-C   BP 125/78  Pulse 88  Temp(Src) 98.6 F (37 C) (Oral)  Resp 16  SpO2 100%  LMP 04/10/2014 Physical Exam  Nursing note and vitals reviewed. Constitutional: She is oriented to person, place, and time. She appears well-developed and well-nourished. No distress.  HENT:  Head: Normocephalic and atraumatic.  Right Ear: External ear normal.  Left Ear: External ear normal.  Nose: Nose normal.  Mouth/Throat: Oropharynx is clear and moist. No oropharyngeal exudate.  Eyes: Conjunctivae are normal.  Neck: Normal range of motion. Neck supple.  Cardiovascular: Normal rate, regular rhythm, normal heart sounds and intact distal pulses.   Pulmonary/Chest: Effort  normal and breath sounds normal. No respiratory distress.  Abdominal: Soft.  Musculoskeletal: Normal range of motion. She exhibits no edema.       Right ankle: Normal. Achilles tendon normal.       Left ankle: Normal. Achilles tendon normal.       Right lower leg: Normal.       Left lower leg: Normal.       Right foot: Normal.       Left foot: Normal.  Neurological: She is alert and oriented to person, place, and time. GCS eye subscore is 4. GCS verbal subscore is 5. GCS motor subscore is 6.  Sensation grossly intact.   Skin: Skin is warm and  dry. She is not diaphoretic. No erythema.  Psychiatric: She has a normal mood and affect.    ED Course  Procedures (including critical care time) Medications  traMADol (ULTRAM) tablet 50 mg (50 mg Oral Not Given 05/02/14 1241)  predniSONE (DELTASONE) tablet 60 mg (60 mg Oral Given 05/02/14 1240)    Labs Review Labs Reviewed - No data to display  Imaging Review No results found.   EKG Interpretation None      MDM   Final diagnoses:  Left foot pain    Filed Vitals:   05/02/14 1303  BP: 125/78  Pulse: 88  Temp:   Resp: 16   Afebrile, NAD, non-toxic appearing, AAOx4.  Neurovascularly intact. Normal sensation. No edema or swelling. No erythema or warmth. Patient with previous negative x-rays and venous duplex studies in last 9 days. Pain managed in ED. Pt advised to follow up with orthopedics if symptoms persist. Advised to continue use of cam walker boot, conservative therapy recommended and discussed. Patient will be dc home & is agreeable with above plan. Patient d/w with Dr. Tawnya Crook, agrees with plan.       Harlow Mares, PA-C 05/02/14 1513

## 2014-05-02 NOTE — ED Notes (Signed)
Pt reports left lower leg pain and swelling x 3 weeks. Pain is 10/10. Seen at Urgent care for the same had a vascular study at [facility] that was negative. Pt reports it is getting worse and not better. Pt NAD and hypertensive in triage.

## 2014-05-02 NOTE — Discharge Instructions (Signed)
Follow up with the sports medicine group as as far as at initial visit the urgent care Center. Please take your medications as prescribed to you. Please take pain medication and/or muscle relaxants as prescribed and as needed for pain. Please do not drive on narcotic pain medication or on muscle relaxants. Please continue to wear your cam walker for symptom relief. Please follow Rice method listed below. Please read all discharge instructions and return precautions.   Ankle Pain Ankle pain is a common symptom. The bones, cartilage, tendons, and muscles of the ankle joint perform a lot of work each day. The ankle joint holds your body weight and allows you to move around. Ankle pain can occur on either side or back of 1 or both ankles. Ankle pain may be sharp and burning or dull and aching. There may be tenderness, stiffness, redness, or warmth around the ankle. The pain occurs more often when a person walks or puts pressure on the ankle. CAUSES  There are many reasons ankle pain can develop. It is important to work with your caregiver to identify the cause since many conditions can impact the bones, cartilage, muscles, and tendons. Causes for ankle pain include:  Injury, including a break (fracture), sprain, or strain often due to a fall, sports, or a high-impact activity.  Swelling (inflammation) of a tendon (tendonitis).  Achilles tendon rupture.  Ankle instability after repeated sprains and strains.  Poor foot alignment.  Pressure on a nerve (tarsal tunnel syndrome).  Arthritis in the ankle or the lining of the ankle.  Crystal formation in the ankle (gout or pseudogout). DIAGNOSIS  A diagnosis is based on your medical history, your symptoms, results of your physical exam, and results of diagnostic tests. Diagnostic tests may include X-ray exams or a computerized magnetic scan (magnetic resonance imaging, MRI). TREATMENT  Treatment will depend on the cause of your ankle pain and may  include:  Keeping pressure off the ankle and limiting activities.  Using crutches or other walking support (a cane or brace).  Using rest, ice, compression, and elevation.  Participating in physical therapy or home exercises.  Wearing shoe inserts or special shoes.  Losing weight.  Taking medications to reduce pain or swelling or receiving an injection.  Undergoing surgery. HOME CARE INSTRUCTIONS   Only take over-the-counter or prescription medicines for pain, discomfort, or fever as directed by your caregiver.  Put ice on the injured area.  Put ice in a plastic bag.  Place a towel between your skin and the bag.  Leave the ice on for 15-20 minutes at a time, 03-04 times a day.  Keep your leg raised (elevated) when possible to lessen swelling.  Avoid activities that cause ankle pain.  Follow specific exercises as directed by your caregiver.  Record how often you have ankle pain, the location of the pain, and what it feels like. This information may be helpful to you and your caregiver.  Ask your caregiver about returning to work or sports and whether you should drive.  Follow up with your caregiver for further examination, therapy, or testing as directed. SEEK MEDICAL CARE IF:   Pain or swelling continues or worsens beyond 1 week.  You have an oral temperature above 102 F (38.9 C).  You are feeling unwell or have chills.  You are having an increasingly difficult time with walking.  You have loss of sensation or other new symptoms.  You have questions or concerns. MAKE SURE YOU:   Understand these instructions.  Will watch your condition.  Will get help right away if you are not doing well or get worse. Document Released: 03/16/2010 Document Revised: 12/19/2011 Document Reviewed: 03/16/2010 [facility] Patient Information 2015 [place], [place]. This information is not intended to replace advice given to you by your health care provider. Make sure you discuss  any questions you have with your health care provider.  RICE: Routine Care for Injuries The routine care of many injuries includes Rest, Ice, Compression, and Elevation (RICE). HOME CARE INSTRUCTIONS  Rest is needed to allow your body to heal. Routine activities can usually be resumed when comfortable. Injured tendons and bones can take up to 6 weeks to heal. Tendons are the cord-like structures that attach muscle to bone.  Ice following an injury helps keep the swelling down and reduces pain.  Put ice in a plastic bag.  Place a towel between your skin and the bag.  Leave the ice on for 15-20 minutes, 3-4 times a day, or as directed by your health care provider. Do this while awake, for the first 24 to 48 hours. After that, continue as directed by your caregiver.  Compression helps keep swelling down. It also gives support and helps with discomfort. If an elastic bandage has been applied, it should be removed and reapplied every 3 to 4 hours. It should not be applied tightly, but firmly enough to keep swelling down. Watch fingers or toes for swelling, bluish discoloration, coldness, numbness, or excessive pain. If any of these problems occur, remove the bandage and reapply loosely. Contact your caregiver if these problems continue.  Elevation helps reduce swelling and decreases pain. With extremities, such as the arms, hands, legs, and feet, the injured area should be placed near or above the level of the heart, if possible. SEEK IMMEDIATE MEDICAL CARE IF:  You have persistent pain and swelling.  You develop redness, numbness, or unexpected weakness.  Your symptoms are getting worse rather than improving after several days. These symptoms may indicate that further evaluation or further X-rays are needed. Sometimes, X-rays may not show a small broken bone (fracture) until 1 week or 10 days later. Make a follow-up appointment with your caregiver. Ask when your X-ray results will be ready.  Make sure you get your X-ray results. Document Released: 01/08/2001 Document Revised: 10/01/2013 Document Reviewed: 02/25/2011 [facility] Patient Information 2015 [place], [place]. This information is not intended to replace advice given to you by your health care provider. Make sure you discuss any questions you have with your health care provider.

## 2014-05-02 NOTE — ED Provider Notes (Signed)
Medical screening examination/treatment/procedure(s) were performed by non-physician practitioner and as supervising physician I was immediately available for consultation/collaboration.   EKG Interpretation None        Neta Ehlers, MD 05/02/14 1919

## 2014-05-15 ENCOUNTER — Ambulatory Visit: Payer: Self-pay | Admitting: Internal Medicine

## 2014-05-22 ENCOUNTER — Encounter: Payer: Self-pay | Admitting: Internal Medicine

## 2014-05-22 ENCOUNTER — Ambulatory Visit: Payer: Self-pay | Attending: Internal Medicine | Admitting: Internal Medicine

## 2014-05-22 VITALS — BP 121/81 | HR 89 | Temp 98.6°F | Resp 18 | Ht 66.0 in | Wt 223.6 lb

## 2014-05-22 DIAGNOSIS — M79609 Pain in unspecified limb: Secondary | ICD-10-CM | POA: Insufficient documentation

## 2014-05-22 DIAGNOSIS — M79605 Pain in left leg: Secondary | ICD-10-CM

## 2014-05-22 DIAGNOSIS — N949 Unspecified condition associated with female genital organs and menstrual cycle: Secondary | ICD-10-CM

## 2014-05-22 DIAGNOSIS — F17200 Nicotine dependence, unspecified, uncomplicated: Secondary | ICD-10-CM | POA: Insufficient documentation

## 2014-05-22 DIAGNOSIS — IMO0002 Reserved for concepts with insufficient information to code with codable children: Secondary | ICD-10-CM | POA: Insufficient documentation

## 2014-05-22 DIAGNOSIS — R102 Pelvic and perineal pain: Secondary | ICD-10-CM

## 2014-05-22 DIAGNOSIS — N3000 Acute cystitis without hematuria: Secondary | ICD-10-CM | POA: Insufficient documentation

## 2014-05-22 LAB — POCT URINALYSIS DIPSTICK
Bilirubin, UA: NEGATIVE
Blood, UA: NEGATIVE
Glucose, UA: NEGATIVE
Ketones, UA: NEGATIVE
NITRITE UA: NEGATIVE
PH UA: 6.5
PROTEIN UA: NEGATIVE
Spec Grav, UA: 1.02
UROBILINOGEN UA: 0.2

## 2014-05-22 LAB — POCT URINE PREGNANCY: PREG TEST UR: NEGATIVE

## 2014-05-22 MED ORDER — ACETAMINOPHEN-CODEINE #3 300-30 MG PO TABS: 1.0000 | ORAL_TABLET | Freq: Three times a day (TID) | ORAL | Status: DC | PRN

## 2014-05-22 MED ORDER — CIPROFLOXACIN HCL 250 MG PO TABS: 250.0000 mg | ORAL_TABLET | Freq: Two times a day (BID) | ORAL | Status: DC

## 2014-05-22 NOTE — Progress Notes (Signed)
Pt stated lt pain swelling for leg the past 4 month on and off on the ER with out any relief Need a pap smear stated no hx of abnormal pap in the past last one was done one yr ago

## 2014-05-22 NOTE — Patient Instructions (Signed)

## 2014-05-22 NOTE — Progress Notes (Signed)
Patient ID: Alexandria Fisher, female   DOB: Mar 07, 1991, 23 y.o.   MRN: [id number]  CC: leg pain, pelvic pain  HPI:  Patient is here for evaluation of left leg pain.  She states that she has been evaluated by urgent care and the ER.  She currently is wearing a cam boot given to her by urgent care and states that has not provided her any relief.  She has tried diclofenac and tramadol for pain without releif.  The pain begins in the dorsum of her left foot and radiates up to her knee causing a sharp achy pain.  She denies any injury or falls.  She also c/o of "uterine pain" for one year.  She states it is a sharp pain that comes and go.  She denies urinary symptoms.  She also c/o of headaches in her frontal lobe that happen randomly that usually last a couple of hours.         No Known Allergies Past Medical History  Diagnosis Date  . Infection   . Depression    Current Outpatient Prescriptions on File Prior to Visit  Medication Sig Dispense Refill  . diclofenac (VOLTAREN) 75 MG EC tablet Take 1 tablet (75 mg total) by mouth 2 (two) times daily.  30 tablet  0  . ibuprofen (ADVIL,MOTRIN) 600 MG tablet Take 1 tablet (600 mg total) by mouth every 6 (six) hours as needed for cramping.  30 tablet  0  . meloxicam (MOBIC) 7.5 MG tablet Take 2 tablets (15 mg total) by mouth daily.  30 tablet  0  . oxyCODONE-acetaminophen (PERCOCET) 5-325 MG per tablet 1 to 2 tablets every 6 hours as needed for pain.  20 tablet  0  . traMADol (ULTRAM) 50 MG tablet Take 1 tablet (50 mg total) by mouth every 6 (six) hours as needed.  12 tablet  0   Current Facility-Administered Medications on File Prior to Visit  Medication Dose Route Frequency Provider Last Rate Last Dose  . medroxyPROGESTERone (DEPO-PROVERA) injection 150 mg  150 mg Intramuscular Q90 days Alexandria Bunde, MD       Family History  Problem Relation Age of Onset  . Breast cancer Maternal Grandmother   . Anemia Mother    History   Social History  .  Marital Status: Single    Spouse Name: N/A    Number of Children: N/A  . Years of Education: N/A   Occupational History  . Not on file.   Social History Main Topics  . Smoking status: Current Every Day Smoker -- 0.50 packs/day    Types: Cigarettes    Last Attempt to Quit: 04/24/2010  . Smokeless tobacco: Never Used  . Alcohol Use: No  . Drug Use: No  . Sexual Activity: Yes    Birth Control/ Protection: None     Comment: MIRENA   Other Topics Concern  . Not on file   Social History Narrative  . No narrative on file    Review of Systems: Constitutional: Negative for fever, chills, diaphoresis, activity change, appetite change and fatigue. HENT: Negative for ear pain, nosebleeds, congestion, facial swelling, rhinorrhea, neck pain, neck stiffness and ear discharge.  Eyes: Negative for pain, discharge, redness, itching and visual disturbance. Respiratory: Negative for cough, choking, chest tightness, shortness of breath, wheezing and stridor.  Cardiovascular: Negative for chest pain, palpitations and leg swelling. Gastrointestinal: Negative for abdominal distention. Genitourinary: Negative for dysuria, urgency, frequency, hematuria, flank pain, decreased urine volume, difficulty urinating and  dyspareunia.  Musculoskeletal: Negative for back pain, joint swelling, arthralgias and gait problem. Neurological: Negative for dizziness, tremors, seizures, syncope, facial asymmetry, speech difficulty, weakness, light-headedness, numbness and headaches.  Hematological: Negative for adenopathy. Does not bruise/bleed easily. Psychiatric/Behavioral: Negative for hallucinations, behavioral problems, confusion, dysphoric mood, decreased concentration and agitation.    Objective:   Filed Vitals:   05/22/14 1018  BP: 121/81  Pulse: 89  Temp: 98.6 F (37 C)  Resp: 18    Physical Exam: Constitutional: Patient appears well-developed and well-nourished. No distress. HENT: Normocephalic,  atraumatic, External right and left ear normal. Oropharynx is clear and moist.  Eyes: Conjunctivae and EOM are normal. PERRLA, no scleral icterus. Neck: Normal ROM. Neck supple. No JVD. No tracheal deviation. No thyromegaly. CVS: RRR, S1/S2 +, no murmurs, no gallops, no carotid bruit.  Pulmonary: Effort and breath sounds normal, no stridor, rhonchi, wheezes, rales.  Abdominal: Soft. BS +,  no distension, rebound or guarding. Pelvic tenderness Musculoskeletal: Normal range of motion. No edema and no tenderness.  Lymphadenopathy: No lymphadenopathy noted, cervical, Neuro: Alert. Normal reflexes, muscle tone coordination. No cranial nerve deficit. Skin: Skin is warm and dry. No rash noted. Not diaphoretic. No erythema. No pallor. Psychiatric: Normal mood and affect. Behavior, judgment, thought content normal.  Lab Results  Component Value Date   WBC 9.6 04/22/2014   HGB 14.1 04/22/2014   HCT 41.3 04/22/2014   MCV 86.0 04/22/2014   PLT 212 04/22/2014   Lab Results  Component Value Date   CREATININE 0.65 04/12/2011   BUN 5* 04/12/2011   NA 136 04/12/2011   K 4.3 04/12/2011   CL 103 04/12/2011   CO2 25 04/12/2011    No results found for this basename: HGBA1C   Lipid Panel  No results found for this basename: chol, trig, hdl, cholhdl, vldl, ldlcalc       Assessment and plan:   Alexandria Fisher was seen today for establish care, gynecologic exam and leg swelling.  Diagnoses and associated orders for this visit:  Acute cystitis without hematuria - ciprofloxacin (CIPRO) 250 MG tablet; Take 1 tablet (250 mg total) by mouth 2 (two) times daily.  Pelvic pain in female - POCT urine pregnancy - POCT urinalysis dipstick - Cervicovaginal ancillary only Will call with results. Patient may need transvaginal ultrasound Left leg pain - acetaminophen-codeine (TYLENOL #3) 300-30 MG per tablet; Take 1 tablet by mouth every 8 (eight) hours as needed for moderate pain.    Pt will call back after ontaining the orange  card for a referral to sports medicine  Return if symptoms worsen or fail to improve.  Alexandria Fisher, [facility] [phone] 05/22/2014, 10:48 AM

## 2014-05-23 ENCOUNTER — Telehealth: Payer: Self-pay | Admitting: Emergency Medicine

## 2014-05-23 MED ORDER — AZITHROMYCIN 500 MG PO TABS: 1000.0000 mg | ORAL_TABLET | Freq: Every day | ORAL | Status: DC

## 2014-05-23 MED ORDER — METRONIDAZOLE 500 MG PO TABS: 500.0000 mg | ORAL_TABLET | Freq: Two times a day (BID) | ORAL | Status: DC

## 2014-05-23 NOTE — Telephone Encounter (Signed)
Pt given pap smear results- positive Chlamydia with  BV. Pt instructed to take prescribed medication Azithromycin 1g and Flagyl 500 mg BID  x7 dys Pt also instructed to refrain from sexual intercourse x 1 week and refrain from alcohol with taking medication Pt informed to have partner treated as well Medication e-scribed and sent to [facility] Pt reported to [facility] department

## 2014-05-26 ENCOUNTER — Telehealth: Payer: Self-pay | Admitting: Internal Medicine

## 2014-05-26 NOTE — Telephone Encounter (Signed)
Returned patient's call regarding refill. Left a voicemail for patient to return call. Vivia Birmingham, RN

## 2014-05-26 NOTE — Telephone Encounter (Signed)
Please contact Patient at her cell phone

## 2014-05-26 NOTE — Telephone Encounter (Signed)
Pt needs refill for two more pills  Please f/u with Patient

## 2014-05-27 ENCOUNTER — Telehealth: Payer: Self-pay | Admitting: *Deleted

## 2014-05-27 NOTE — Telephone Encounter (Signed)
Patient had a question about Azithromycin dosing. Patient thought she needed two more pills. Informed patient she should have two pills that are 500 mg each which equates to 1000 mg or 1 gram. Patient states she does. Informed patient that is the correct dose for treatment of Chlamydia. Also informed patient to abstain from sex for one week. Patient verbalized understanding. Vivia Birmingham, RN

## 2014-06-02 ENCOUNTER — Ambulatory Visit: Payer: Self-pay

## 2014-07-01 ENCOUNTER — Ambulatory Visit: Payer: Self-pay

## 2014-07-11 ENCOUNTER — Ambulatory Visit: Payer: Self-pay

## 2014-07-17 ENCOUNTER — Emergency Department (HOSPITAL_COMMUNITY)
Admission: EM | Admit: 2014-07-17 | Discharge: 2014-07-17 | Disposition: A | Discharge status: Home / Self Care | Payer: No Typology Code available for payment source | Attending: Emergency Medicine | Admitting: Emergency Medicine

## 2014-07-17 ENCOUNTER — Encounter (HOSPITAL_COMMUNITY): Payer: Self-pay | Admitting: Emergency Medicine

## 2014-07-17 DIAGNOSIS — Y9389 Activity, other specified: Secondary | ICD-10-CM | POA: Diagnosis not present

## 2014-07-17 DIAGNOSIS — Z79899 Other long term (current) drug therapy: Secondary | ICD-10-CM | POA: Insufficient documentation

## 2014-07-17 DIAGNOSIS — F329 Major depressive disorder, single episode, unspecified: Secondary | ICD-10-CM | POA: Insufficient documentation

## 2014-07-17 DIAGNOSIS — S3992XA Unspecified injury of lower back, initial encounter: Secondary | ICD-10-CM | POA: Diagnosis not present

## 2014-07-17 DIAGNOSIS — Z791 Long term (current) use of non-steroidal anti-inflammatories (NSAID): Secondary | ICD-10-CM | POA: Insufficient documentation

## 2014-07-17 DIAGNOSIS — S4991XA Unspecified injury of right shoulder and upper arm, initial encounter: Secondary | ICD-10-CM | POA: Insufficient documentation

## 2014-07-17 DIAGNOSIS — Z720 Tobacco use: Secondary | ICD-10-CM | POA: Diagnosis not present

## 2014-07-17 DIAGNOSIS — M549 Dorsalgia, unspecified: Secondary | ICD-10-CM

## 2014-07-17 DIAGNOSIS — S0990XA Unspecified injury of head, initial encounter: Secondary | ICD-10-CM | POA: Diagnosis present

## 2014-07-17 DIAGNOSIS — Y92410 Unspecified street and highway as the place of occurrence of the external cause: Secondary | ICD-10-CM | POA: Diagnosis not present

## 2014-07-17 DIAGNOSIS — G44319 Acute post-traumatic headache, not intractable: Secondary | ICD-10-CM

## 2014-07-17 DIAGNOSIS — Z792 Long term (current) use of antibiotics: Secondary | ICD-10-CM | POA: Insufficient documentation

## 2014-07-17 MED ORDER — NAPROXEN 500 MG PO TABS
500.0000 mg | ORAL_TABLET | Freq: Once | ORAL | Status: AC
  Administered 2014-07-17: 500 mg via ORAL
  Filled 2014-07-17: qty 1

## 2014-07-17 MED ORDER — MELOXICAM 7.5 MG PO TABS: 7.5000 mg | ORAL_TABLET | Freq: Every day | ORAL | Status: DC

## 2014-07-17 MED ORDER — TRAMADOL HCL 50 MG PO TABS: 50.0000 mg | ORAL_TABLET | Freq: Four times a day (QID) | ORAL | Status: DC | PRN
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
## 2014-07-18 NOTE — ED Provider Notes (Signed)
Medical screening examination/treatment/procedure(s) were performed by non-physician practitioner and as supervising physician I was immediately available for consultation/collaboration.    Dorie Rank, MD 07/18/14 [id number]

## 2014-08-11 ENCOUNTER — Encounter (HOSPITAL_COMMUNITY): Payer: Self-pay | Admitting: Emergency Medicine

## 2015-01-06 ENCOUNTER — Encounter (HOSPITAL_COMMUNITY): Payer: Self-pay | Admitting: *Deleted

## 2015-01-06 ENCOUNTER — Emergency Department (INDEPENDENT_AMBULATORY_CARE_PROVIDER_SITE_OTHER)
Admission: EM | Admit: 2015-01-06 | Discharge: 2015-01-06 | Disposition: A | Discharge status: Home / Self Care | Payer: Self-pay | Source: Home / Self Care | Attending: Family Medicine | Admitting: Family Medicine

## 2015-01-06 DIAGNOSIS — R69 Illness, unspecified: Principal | ICD-10-CM

## 2015-01-06 DIAGNOSIS — J111 Influenza due to unidentified influenza virus with other respiratory manifestations: Secondary | ICD-10-CM

## 2015-01-06 MED ORDER — IPRATROPIUM BROMIDE 0.06 % NA SOLN: 2.0000 | Freq: Four times a day (QID) | NASAL | Status: DC

## 2015-01-06 MED ORDER — HYDROCOD POLST-CHLORPHEN POLST 10-8 MG/5ML PO LQCR: 5.0000 mL | Freq: Two times a day (BID) | ORAL | Status: DC | PRN

## 2015-01-06 NOTE — ED Provider Notes (Signed)
CSN: [id number]     Arrival date & time 01/06/15  1300 History   First MD Initiated Contact with Patient 01/06/15 1359     Chief Complaint  Patient presents with  . Generalized Body Aches   (Consider location/radiation/quality/duration/timing/severity/associated sxs/prior Treatment) Patient is a 24 y.o. female presenting with URI. The history is provided by the patient.  URI Presenting symptoms: congestion, cough, fever, rhinorrhea and sore throat   Severity:  Moderate Onset quality:  Sudden Duration:  2 days Progression:  Worsening Chronicity:  New Relieved by:  None tried Worsened by:  Nothing tried Ineffective treatments:  None tried Associated symptoms: myalgias   Associated symptoms: no wheezing   Risk factors: sick contacts     Past Medical History  Diagnosis Date  . Infection   . Depression    Past Surgical History  Procedure Laterality Date  . No past surgeries    . Dilation and evacuation N/A 09/19/2013    Procedure: DILATATION AND EVACUATION;  Surgeon: Guss Bunde, MD;  Location: [facility];  Service: Gynecology;  Laterality: N/A;   Family History  Problem Relation Age of Onset  . Breast cancer Maternal Grandmother   . Anemia Mother    History  Substance Use Topics  . Smoking status: Current Every Day Smoker -- 0.50 packs/day    Types: Cigarettes  . Smokeless tobacco: Never Used  . Alcohol Use: No     Comment: former drinker   OB History    Gravida Para Term Preterm AB TAB SAB Ectopic Multiple Living   4 2 2  1  1   2      Review of Systems  Constitutional: Positive for fever and chills.  HENT: Positive for congestion, postnasal drip, rhinorrhea and sore throat.   Respiratory: Positive for cough. Negative for wheezing.   Cardiovascular: Negative.   Gastrointestinal: Negative.   Genitourinary: Negative.   Musculoskeletal: Positive for myalgias.    Allergies  Review of patient's allergies indicates no known allergies.  Home Medications    Prior to Admission medications   Medication Sig Start Date End Date Taking? Authorizing Provider  acetaminophen-codeine (TYLENOL #3) 300-30 MG per tablet Take 1 tablet by mouth every 8 (eight) hours as needed for moderate pain. 05/22/14   Lance Bosch, NP  azithromycin (ZITHROMAX) 500 MG tablet Take 2 tablets (1,000 mg total) by mouth daily. 05/23/14   Lance Bosch, NP  chlorpheniramine-HYDROcodone (TUSSIONEX PENNKINETIC ER) 10-8 MG/5ML LQCR Take 5 mLs by mouth every 12 (twelve) hours as needed for cough. 01/06/15   Billy Fischer, MD  ciprofloxacin (CIPRO) 250 MG tablet Take 1 tablet (250 mg total) by mouth 2 (two) times daily. 05/22/14   Lance Bosch, NP  diclofenac (VOLTAREN) 75 MG EC tablet Take 1 tablet (75 mg total) by mouth 2 (two) times daily. 04/22/14   Harden Mo, MD  ibuprofen (ADVIL,MOTRIN) 600 MG tablet Take 1 tablet (600 mg total) by mouth every 6 (six) hours as needed for cramping. 09/19/13   Guss Bunde, MD  ipratropium (ATROVENT) 0.06 % nasal spray Place 2 sprays into both nostrils 4 (four) times daily. 01/06/15   Billy Fischer, MD  meloxicam (MOBIC) 7.5 MG tablet Take 2 tablets (15 mg total) by mouth daily. 05/02/14   Jennifer Piepenbrink, PA-C  meloxicam (MOBIC) 7.5 MG tablet Take 1 tablet (7.5 mg total) by mouth daily. 07/17/14   Noland Fordyce, PA-C  metroNIDAZOLE (FLAGYL) 500 MG tablet Take 1 tablet (500 mg total)  by mouth 2 (two) times daily. 05/23/14   Lance Bosch, NP  oxyCODONE-acetaminophen (PERCOCET) 5-325 MG per tablet 1 to 2 tablets every 6 hours as needed for pain. 04/22/14   Harden Mo, MD  traMADol (ULTRAM) 50 MG tablet Take 1 tablet (50 mg total) by mouth every 6 (six) hours as needed. 05/02/14   Jennifer Piepenbrink, PA-C  traMADol (ULTRAM) 50 MG tablet Take 1 tablet (50 mg total) by mouth every 6 (six) hours as needed. 07/17/14   Noland Fordyce, PA-C   BP 148/86 mmHg  Pulse 97  Temp(Src) 98.4 F (36.9 C) (Oral)  Resp 20  SpO2 100%  LMP  12/25/2014 Physical Exam  Constitutional: She is oriented to person, place, and time. She appears well-developed and well-nourished. No distress.  HENT:  Right Ear: External ear normal.  Left Ear: External ear normal.  Nose: Mucosal edema and rhinorrhea present.  Mouth/Throat: Oropharynx is clear and moist.  Eyes: Pupils are equal, round, and reactive to light.  Neck: Normal range of motion. Neck supple.  Cardiovascular: Normal rate, regular rhythm, normal heart sounds and intact distal pulses.   Pulmonary/Chest: Effort normal and breath sounds normal.  Abdominal: Soft. Bowel sounds are normal.  Lymphadenopathy:    She has no cervical adenopathy.  Neurological: She is alert and oriented to person, place, and time.  Skin: Skin is warm and dry.  Nursing note and vitals reviewed.   ED Course  Procedures (including critical care time) Labs Review Labs Reviewed - No data to display  Imaging Review No results found.   MDM   1. Influenza-like illness        Billy Fischer, MD 01/06/15 [phone]

## 2015-01-06 NOTE — ED Notes (Signed)
Pt  Reports   Symptoms  Of  Body  Aches   sorethroat           Headache  Runny  Nose   Cough          With  Onset  Of  Symptoms  Yesterday    Getting  worse

## 2015-01-28 ENCOUNTER — Inpatient Hospital Stay (HOSPITAL_COMMUNITY)
Admission: AD | Admit: 2015-01-28 | Discharge: 2015-01-28 | Disposition: A | Discharge status: Home / Self Care | Payer: Self-pay | Source: Ambulatory Visit | Attending: Obstetrics & Gynecology | Admitting: Obstetrics & Gynecology

## 2015-01-28 ENCOUNTER — Encounter (HOSPITAL_COMMUNITY): Payer: Self-pay | Admitting: *Deleted

## 2015-01-28 DIAGNOSIS — R109 Unspecified abdominal pain: Secondary | ICD-10-CM | POA: Insufficient documentation

## 2015-01-28 DIAGNOSIS — F17210 Nicotine dependence, cigarettes, uncomplicated: Secondary | ICD-10-CM | POA: Insufficient documentation

## 2015-01-28 DIAGNOSIS — N390 Urinary tract infection, site not specified: Secondary | ICD-10-CM

## 2015-01-28 HISTORY — DX: Chlamydial infection, unspecified: A74.9

## 2015-01-28 LAB — URINE MICROSCOPIC-ADD ON

## 2015-01-28 LAB — URINALYSIS, ROUTINE W REFLEX MICROSCOPIC
BILIRUBIN URINE: NEGATIVE
Glucose, UA: NEGATIVE mg/dL
Ketones, ur: NEGATIVE mg/dL
Nitrite: NEGATIVE
PH: 6 (ref 5.0–8.0)
Protein, ur: NEGATIVE mg/dL
SPECIFIC GRAVITY, URINE: 1.015 (ref 1.005–1.030)
Urobilinogen, UA: 0.2 mg/dL (ref 0.0–1.0)

## 2015-01-28 LAB — WET PREP, GENITAL
Clue Cells Wet Prep HPF POC: NONE SEEN
Trich, Wet Prep: NONE SEEN
Yeast Wet Prep HPF POC: NONE SEEN

## 2015-01-28 LAB — POCT PREGNANCY, URINE: Preg Test, Ur: NEGATIVE

## 2015-01-28 MED ORDER — SULFAMETHOXAZOLE-TRIMETHOPRIM 800-160 MG PO TABS: 1.0000 | ORAL_TABLET | Freq: Two times a day (BID) | ORAL | Status: AC

## 2015-01-28 NOTE — MAU Provider Note (Signed)
History     CSN: [id number]  Arrival date and time: 01/28/15 1110   First Provider Initiated Contact with Patient 01/28/15 1303      Chief Complaint  Patient presents with  . Abdominal Pain  . Back Pain   HPI Comments: [name] is a 24 y.o. Female [id number] who presents with abdominal pain and burning inside the vagina. When she uses the bathroom she notices an odor from her vagina or from her urine; she cannot determine where the odor is coming from. She has a history of chlamydia.   Abdominal Pain This is a new problem. The current episode started more than 1 month ago. The onset quality is gradual. The problem occurs intermittently. The pain is located in the suprapubic region. The pain is at a severity of 8/10. The quality of the pain is cramping. Pertinent negatives include no constipation, diarrhea, nausea or vomiting. Nothing aggravates the pain. The pain is relieved by nothing. She has tried nothing for the symptoms.   RN note:  Keep having uterus pain and back pain (couple wks). When Wipes notes a strong Urine odor and some burning, this just started. Neg CVA tenderness   OB History    Gravida Para Term Preterm AB TAB SAB Ectopic Multiple Living   4 2 2  1  1   2       Past Medical History  Diagnosis Date  . Infection   . Depression   . Chlamydia     Past Surgical History  Procedure Laterality Date  . Dilation and evacuation N/A 09/19/2013    Procedure: DILATATION AND EVACUATION;  Surgeon: Guss Bunde, MD;  Location: [facility];  Service: Gynecology;  Laterality: N/A;    Family History  Problem Relation Age of Onset  . Breast cancer Maternal Grandmother   . Anemia Mother     History  Substance Use Topics  . Smoking status: Current Every Day Smoker -- 0.50 packs/day    Types: Cigarettes  . Smokeless tobacco: Former Systems developer  . Alcohol Use: No     Comment: former drinker    Allergies: No Known Allergies  Facility-administered medications  prior to admission  Medication Dose Route Frequency Provider Last Rate Last Dose  . medroxyPROGESTERone (DEPO-PROVERA) injection 150 mg  150 mg Intramuscular Q90 days Guss Bunde, MD       Prescriptions prior to admission  Medication Sig Dispense Refill Last Dose  . acetaminophen-codeine (TYLENOL #3) 300-30 MG per tablet Take 1 tablet by mouth every 8 (eight) hours as needed for moderate pain. (Patient not taking: Reported on 01/28/2015) 30 tablet 0   . azithromycin (ZITHROMAX) 500 MG tablet Take 2 tablets (1,000 mg total) by mouth daily. (Patient not taking: Reported on 01/28/2015) 2 tablet 0   . chlorpheniramine-HYDROcodone (TUSSIONEX PENNKINETIC ER) 10-8 MG/5ML LQCR Take 5 mLs by mouth every 12 (twelve) hours as needed for cough. (Patient not taking: Reported on 01/28/2015) 115 mL 0   . ciprofloxacin (CIPRO) 250 MG tablet Take 1 tablet (250 mg total) by mouth 2 (two) times daily. (Patient not taking: Reported on 01/28/2015) 6 tablet 0   . diclofenac (VOLTAREN) 75 MG EC tablet Take 1 tablet (75 mg total) by mouth 2 (two) times daily. (Patient not taking: Reported on 01/28/2015) 30 tablet 0 Not Taking  . ibuprofen (ADVIL,MOTRIN) 600 MG tablet Take 1 tablet (600 mg total) by mouth every 6 (six) hours as needed for cramping. (Patient not taking: Reported on 01/28/2015) 30  tablet 0 Not Taking  . ipratropium (ATROVENT) 0.06 % nasal spray Place 2 sprays into both nostrils 4 (four) times daily. (Patient not taking: Reported on 01/28/2015) 15 mL 1   . meloxicam (MOBIC) 7.5 MG tablet Take 2 tablets (15 mg total) by mouth daily. (Patient not taking: Reported on 01/28/2015) 30 tablet 0 Not Taking  . meloxicam (MOBIC) 7.5 MG tablet Take 1 tablet (7.5 mg total) by mouth daily. (Patient not taking: Reported on 01/28/2015) 30 tablet 0   . metroNIDAZOLE (FLAGYL) 500 MG tablet Take 1 tablet (500 mg total) by mouth 2 (two) times daily. (Patient not taking: Reported on 01/28/2015) 14 tablet 0   . oxyCODONE-acetaminophen  (PERCOCET) 5-325 MG per tablet 1 to 2 tablets every 6 hours as needed for pain. (Patient not taking: Reported on 01/28/2015) 20 tablet 0 Not Taking  . traMADol (ULTRAM) 50 MG tablet Take 1 tablet (50 mg total) by mouth every 6 (six) hours as needed. (Patient not taking: Reported on 01/28/2015) 12 tablet 0 Not Taking  . traMADol (ULTRAM) 50 MG tablet Take 1 tablet (50 mg total) by mouth every 6 (six) hours as needed. (Patient not taking: Reported on 01/28/2015) 15 tablet 0    Results for orders placed or performed during the hospital encounter of 01/28/15 (from the past 48 hour(s))  Pregnancy, urine POC     Status: None   Collection Time: 01/28/15 12:00 PM  Result Value Ref Range   Preg Test, Ur NEGATIVE NEGATIVE    Comment:        THE SENSITIVITY OF THIS METHODOLOGY IS >24 mIU/mL   Urinalysis, Routine w reflex microscopic     Status: Abnormal   Collection Time: 01/28/15 12:24 PM  Result Value Ref Range   Color, Urine YELLOW YELLOW   APPearance CLEAR CLEAR   Specific Gravity, Urine 1.015 1.005 - 1.030   pH 6.0 5.0 - 8.0   Glucose, UA NEGATIVE NEGATIVE mg/dL   Hgb urine dipstick LARGE (A) NEGATIVE   Bilirubin Urine NEGATIVE NEGATIVE   Ketones, ur NEGATIVE NEGATIVE mg/dL   Protein, ur NEGATIVE NEGATIVE mg/dL   Urobilinogen, UA 0.2 0.0 - 1.0 mg/dL   Nitrite NEGATIVE NEGATIVE   Leukocytes, UA SMALL (A) NEGATIVE  Urine microscopic-add on     Status: Abnormal   Collection Time: 01/28/15 12:24 PM  Result Value Ref Range   Squamous Epithelial / LPF FEW (A) RARE   WBC, UA 0-2 <3 WBC/hpf   RBC / HPF 3-6 <3 RBC/hpf   Bacteria, UA RARE RARE  Wet prep, genital     Status: Abnormal   Collection Time: 01/28/15  1:30 PM  Result Value Ref Range   Yeast Wet Prep HPF POC NONE SEEN NONE SEEN   Trich, Wet Prep NONE SEEN NONE SEEN   Clue Cells Wet Prep HPF POC NONE SEEN NONE SEEN   WBC, Wet Prep HPF POC FEW (A) NONE SEEN    Comment: MODERATE BACTERIA SEEN    Review of Systems  Gastrointestinal:  Positive for abdominal pain. Negative for nausea, vomiting, diarrhea and constipation.   Physical Exam   Blood pressure 121/77, pulse 90, temperature 98 F (36.7 C), temperature source Oral, resp. rate 18, height 5\' 4"  (1.626 m), weight 111.585 kg (246 lb), last menstrual period 01/24/2015.  Physical Exam  Constitutional: She is oriented to person, place, and time. She appears well-developed and well-nourished. No distress.  Respiratory: Effort normal.  GI: Soft. She exhibits no distension. There is no tenderness. There  is no rebound and no guarding.  Genitourinary: Vaginal discharge found.  Speculum exam: Vagina - Small amount of creamy, pink discharge, mild odor Cervix - No contact bleeding Bimanual exam: Cervix closed Uterus non tender, normal size Adnexa non tender, no masses bilaterally GC/Chlam, wet prep done Chaperone present for exam.  Neurological: She is alert and oriented to person, place, and time.  Skin: Skin is warm. She is not diaphoretic.  Psychiatric: Her behavior is normal.    MAU Course  Procedures  None  MDM Urine culture pending GC pending   Assessment and Plan   A:  1. Abdominal pain in female   2. UTI (lower urinary tract infection)     P:  Discharge home in stable condition  RX: bactrim Return to MAU for emergencies only Condoms always GC pending   Lezlie Lye, NP 01/28/2015 3:34 PM

## 2015-01-28 NOTE — MAU Note (Addendum)
Keep having uterus pain and back pain (couple wks).  When  Wipes notes a strong  Urine odor and some burning, this just started.  Neg CVA tenderness

## 2015-01-29 LAB — HIV ANTIBODY (ROUTINE TESTING W REFLEX): HIV Screen 4th Generation wRfx: NONREACTIVE

## 2015-01-29 LAB — GC/CHLAMYDIA PROBE AMP (~~LOC~~) NOT AT ARMC
Chlamydia: NEGATIVE
Neisseria Gonorrhea: NEGATIVE

## 2015-01-30 LAB — URINE CULTURE
Colony Count: NO GROWTH
Culture: NO GROWTH
Special Requests: NORMAL

## 2015-02-19 ENCOUNTER — Encounter: Payer: Self-pay | Admitting: Internal Medicine

## 2015-02-19 ENCOUNTER — Ambulatory Visit: Payer: Self-pay | Attending: Internal Medicine | Admitting: Internal Medicine

## 2015-02-19 VITALS — BP 123/84 | HR 95 | Wt 246.0 lb

## 2015-02-19 DIAGNOSIS — M722 Plantar fascial fibromatosis: Secondary | ICD-10-CM

## 2015-02-19 NOTE — Progress Notes (Signed)
Pt c/o right/left ankle pain for several months.

## 2015-02-19 NOTE — Progress Notes (Signed)
   Subjective:    Patient ID: Lonell Grandchild, female    DOB: 1991-02-22, 24 y.o.   MRN: [id number]  HPI Comments: Last year she was seen in the ED for left leg pain. She reports since then she has been having pain in her ankle and foot. The pain is intermittent. Pain described as a ache. The pain is often on the top of her left foot but other times it is located in the arch and heel. When the pain is in her heel it refers to her ankles. Pain is worse in the morning when she gets out of bed and when she walks after sitting for a period of time. Has not been taking anything for pain.   Ankle Pain  The incident occurred more than 1 week ago.      Review of Systems  All other systems reviewed and are negative.      Objective:   Physical Exam  Musculoskeletal:  Inflammation of left fascia       Assessment & Plan:  Baylynn was seen today for right ankle pain.  Diagnoses and all orders for this visit:  Plantar fasciitis of left foot I have advised patient to take ibuprofen 800 mg twice daily for pain. I have given patient a handout of exercises and demonstrated several to patient.  Be sure to apply for the [facility] discount and orange card- ask about the applications at the front desk. I will send ortho referral once the process is complete.  Return if symptoms worsen or fail to improve.  Chari Manning, NP 02/19/2015 11:14 PM

## 2015-02-19 NOTE — Patient Instructions (Signed)
Plantar Fasciitis (Heel Spur Syndrome) with Rehab The plantar fascia is a fibrous, ligament-like, soft-tissue structure that spans the bottom of the foot. Plantar fasciitis is a condition that causes pain in the foot due to inflammation of the tissue. SYMPTOMS   Pain and tenderness on the underneath side of the foot.  Pain that worsens with standing or walking. CAUSES  Plantar fasciitis is caused by irritation and injury to the plantar fascia on the underneath side of the foot. Common mechanisms of injury include:  Direct trauma to bottom of the foot.  Damage to a small nerve that runs under the foot where the main fascia attaches to the heel bone.  Stress placed on the plantar fascia due to bone spurs. RISK INCREASES WITH:   Activities that place stress on the plantar fascia (running, jumping, pivoting, or cutting).  Poor strength and flexibility.  Improperly fitted shoes.  Tight calf muscles.  Flat feet.  Failure to warm-up properly before activity.  Obesity. PREVENTION  Warm up and stretch properly before activity.  Allow for adequate recovery between workouts.  Maintain physical fitness:  Strength, flexibility, and endurance.  Cardiovascular fitness.  Maintain a health body weight.  Avoid stress on the plantar fascia.  Wear properly fitted shoes, including arch supports for individuals who have flat feet. PROGNOSIS  If treated properly, then the symptoms of plantar fasciitis usually resolve without surgery. However, occasionally surgery is necessary. RELATED COMPLICATIONS   Recurrent symptoms that may result in a chronic condition.  Problems of the lower back that are caused by compensating for the injury, such as limping.  Pain or weakness of the foot during push-off following surgery.  Chronic inflammation, scarring, and partial or complete fascia tear, occurring more often from repeated injections. TREATMENT  Treatment initially involves the use of  ice and medication to help reduce pain and inflammation. The use of strengthening and stretching exercises may help reduce pain with activity, especially stretches of the Achilles tendon. These exercises may be performed at home or with a therapist. Your caregiver may recommend that you use heel cups of arch supports to help reduce stress on the plantar fascia. Occasionally, corticosteroid injections are given to reduce inflammation. If symptoms persist for greater than 6 months despite non-surgical (conservative), then surgery may be recommended.  MEDICATION   If pain medication is necessary, then nonsteroidal anti-inflammatory medications, such as aspirin and ibuprofen, or other minor pain relievers, such as acetaminophen, are often recommended.  Do not take pain medication within 7 days before surgery.  Prescription pain relievers may be given if deemed necessary by your caregiver. Use only as directed and only as much as you need.  Corticosteroid injections may be given by your caregiver. These injections should be reserved for the most serious cases, because they may only be given a certain number of times. HEAT AND COLD  Cold treatment (icing) relieves pain and reduces inflammation. Cold treatment should be applied for 10 to 15 minutes every 2 to 3 hours for inflammation and pain and immediately after any activity that aggravates your symptoms. Use ice packs or massage the area with a piece of ice (ice massage).  Heat treatment may be used prior to performing the stretching and strengthening activities prescribed by your caregiver, physical therapist, or athletic trainer. Use a heat pack or soak the injury in warm water. SEEK IMMEDIATE MEDICAL CARE IF:  Treatment seems to offer no benefit, or the condition worsens.  Any medications produce adverse side effects. EXERCISES RANGE   OF MOTION (ROM) AND STRETCHING EXERCISES - Plantar Fasciitis (Heel Spur Syndrome) These exercises may help you  when beginning to rehabilitate your injury. Your symptoms may resolve with or without further involvement from your physician, physical therapist or athletic trainer. While completing these exercises, remember:   Restoring tissue flexibility helps normal motion to return to the joints. This allows healthier, less painful movement and activity.  An effective stretch should be held for at least 30 seconds.  A stretch should never be painful. You should only feel a gentle lengthening or release in the stretched tissue. RANGE OF MOTION - Toe Extension, Flexion  Sit with your right / left leg crossed over your opposite knee.  Grasp your toes and gently pull them back toward the top of your foot. You should feel a stretch on the bottom of your toes and/or foot.  Hold this stretch for __________ seconds.  Now, gently pull your toes toward the bottom of your foot. You should feel a stretch on the top of your toes and or foot.  Hold this stretch for __________ seconds. Repeat __________ times. Complete this stretch __________ times per day.  RANGE OF MOTION - Ankle Dorsiflexion, Active Assisted  Remove shoes and sit on a chair that is preferably not on a carpeted surface.  Place right / left foot under knee. Extend your opposite leg for support.  Keeping your heel down, slide your right / left foot back toward the chair until you feel a stretch at your ankle or calf. If you do not feel a stretch, slide your bottom forward to the edge of the chair, while still keeping your heel down.  Hold this stretch for __________ seconds. Repeat __________ times. Complete this stretch __________ times per day.  STRETCH - Gastroc, Standing  Place hands on wall.  Extend right / left leg, keeping the front knee somewhat bent.  Slightly point your toes inward on your back foot.  Keeping your right / left heel on the floor and your knee straight, shift your weight toward the wall, not allowing your back to  arch.  You should feel a gentle stretch in the right / left calf. Hold this position for __________ seconds. Repeat __________ times. Complete this stretch __________ times per day. STRETCH - Soleus, Standing  Place hands on wall.  Extend right / left leg, keeping the other knee somewhat bent.  Slightly point your toes inward on your back foot.  Keep your right / left heel on the floor, bend your back knee, and slightly shift your weight over the back leg so that you feel a gentle stretch deep in your back calf.  Hold this position for __________ seconds. Repeat __________ times. Complete this stretch __________ times per day. STRETCH - Gastrocsoleus, Standing  Note: This exercise can place a lot of stress on your foot and ankle. Please complete this exercise only if specifically instructed by your caregiver.   Place the ball of your right / left foot on a step, keeping your other foot firmly on the same step.  Hold on to the wall or a rail for balance.  Slowly lift your other foot, allowing your body weight to press your heel down over the edge of the step.  You should feel a stretch in your right / left calf.  Hold this position for __________ seconds.  Repeat this exercise with a slight bend in your right / left knee. Repeat __________ times. Complete this stretch __________ times per day.    STRENGTHENING EXERCISES - Plantar Fasciitis (Heel Spur Syndrome)  These exercises may help you when beginning to rehabilitate your injury. They may resolve your symptoms with or without further involvement from your physician, physical therapist or athletic trainer. While completing these exercises, remember:   Muscles can gain both the endurance and the strength needed for everyday activities through controlled exercises.  Complete these exercises as instructed by your physician, physical therapist or athletic trainer. Progress the resistance and repetitions only as guided. STRENGTH -  Towel Curls  Sit in a chair positioned on a non-carpeted surface.  Place your foot on a towel, keeping your heel on the floor.  Pull the towel toward your heel by only curling your toes. Keep your heel on the floor.  If instructed by your physician, physical therapist or athletic trainer, add ____________________ at the end of the towel. Repeat __________ times. Complete this exercise __________ times per day. STRENGTH - Ankle Inversion  Secure one end of a rubber exercise band/tubing to a fixed object (table, pole). Loop the other end around your foot just before your toes.  Place your fists between your knees. This will focus your strengthening at your ankle.  Slowly, pull your big toe up and in, making sure the band/tubing is positioned to resist the entire motion.  Hold this position for __________ seconds.  Have your muscles resist the band/tubing as it slowly pulls your foot back to the starting position. Repeat __________ times. Complete this exercises __________ times per day.  Document Released: 09/26/2005 Document Revised: 12/19/2011 Document Reviewed: 01/08/2009 [employer] Patient Information 2015 [employer]. This information is not intended to replace advice given to you by your health care provider. Make sure you discuss any questions you have with your health care provider.  

## 2015-04-10 ENCOUNTER — Ambulatory Visit: Payer: Self-pay

## 2015-05-08 ENCOUNTER — Ambulatory Visit: Payer: Self-pay

## 2015-07-02 ENCOUNTER — Emergency Department (INDEPENDENT_AMBULATORY_CARE_PROVIDER_SITE_OTHER)
Admission: EM | Admit: 2015-07-02 | Discharge: 2015-07-02 | Disposition: A | Discharge status: Home / Self Care | Payer: Self-pay | Source: Home / Self Care | Attending: Family Medicine | Admitting: Family Medicine

## 2015-07-02 ENCOUNTER — Encounter (HOSPITAL_COMMUNITY): Payer: Self-pay | Admitting: Emergency Medicine

## 2015-07-02 DIAGNOSIS — S29012A Strain of muscle and tendon of back wall of thorax, initial encounter: Secondary | ICD-10-CM

## 2015-07-02 DIAGNOSIS — Z041 Encounter for examination and observation following transport accident: Secondary | ICD-10-CM

## 2015-07-02 DIAGNOSIS — Z043 Encounter for examination and observation following other accident: Secondary | ICD-10-CM

## 2015-07-02 DIAGNOSIS — S39012A Strain of muscle, fascia and tendon of lower back, initial encounter: Secondary | ICD-10-CM

## 2015-07-02 DIAGNOSIS — N390 Urinary tract infection, site not specified: Secondary | ICD-10-CM

## 2015-07-02 DIAGNOSIS — S161XXA Strain of muscle, fascia and tendon at neck level, initial encounter: Secondary | ICD-10-CM

## 2015-07-02 DIAGNOSIS — S29019A Strain of muscle and tendon of unspecified wall of thorax, initial encounter: Secondary | ICD-10-CM

## 2015-07-02 LAB — POCT URINALYSIS DIP (DEVICE)
Bilirubin Urine: NEGATIVE
Glucose, UA: NEGATIVE mg/dL
Ketones, ur: NEGATIVE mg/dL
NITRITE: NEGATIVE
PROTEIN: NEGATIVE mg/dL
SPECIFIC GRAVITY, URINE: 1.015 (ref 1.005–1.030)
UROBILINOGEN UA: 0.2 mg/dL (ref 0.0–1.0)
pH: 7 (ref 5.0–8.0)

## 2015-07-02 LAB — POCT PREGNANCY, URINE: Preg Test, Ur: NEGATIVE

## 2015-07-02 MED ORDER — CEPHALEXIN 500 MG PO CAPS: 500.0000 mg | ORAL_CAPSULE | Freq: Four times a day (QID) | ORAL | Status: DC

## 2015-07-02 NOTE — Discharge Instructions (Signed)
Back Pain, Adult Low back pain is very common. About 1 in 5 people have back pain.The cause of low back pain is rarely dangerous. The pain often gets better over time.About half of people with a sudden onset of back pain feel better in just 2 weeks. About 8 in 10 people feel better by 6 weeks.  CAUSES Some common causes of back pain include:  Strain of the muscles or ligaments supporting the spine.  Wear and tear (degeneration) of the spinal discs.  Arthritis.  Direct injury to the back. DIAGNOSIS Most of the time, the direct cause of low back pain is not known.However, back pain can be treated effectively even when the exact cause of the pain is unknown.Answering your caregiver's questions about your overall health and symptoms is one of the most accurate ways to make sure the cause of your pain is not dangerous. If your caregiver needs more information, he or she may order lab work or imaging tests (X-rays or MRIs).However, even if imaging tests show changes in your back, this usually does not require surgery. HOME CARE INSTRUCTIONS For many people, back pain returns.Since low back pain is rarely dangerous, it is often a condition that people can learn to [facility] their own.   Remain active. It is stressful on the back to sit or stand in one place. Do not sit, drive, or stand in one place for more than 30 minutes at a time. Take short walks on level surfaces as soon as pain allows.Try to increase the length of time you walk each day.  Do not stay in bed.Resting more than 1 or 2 days can delay your recovery.  Do not avoid exercise or work.Your body is made to move.It is not dangerous to be active, even though your back may hurt.Your back will likely heal faster if you return to being active before your pain is gone.  Pay attention to your body when you bend and lift. Many people have less discomfortwhen lifting if they bend their knees, keep the load close to their bodies,and  avoid twisting. Often, the most comfortable positions are those that put less stress on your recovering back.  Find a comfortable position to sleep. Use a firm mattress and lie on your side with your knees slightly bent. If you lie on your back, put a pillow under your knees.  Only take over-the-counter or prescription medicines as directed by your caregiver. Over-the-counter medicines to reduce pain and inflammation are often the most helpful.Your caregiver may prescribe muscle relaxant drugs.These medicines help dull your pain so you can more quickly return to your normal activities and healthy exercise.  Put ice on the injured area.  Put ice in a plastic bag.  Place a towel between your skin and the bag.  Leave the ice on for 15-20 minutes, 03-04 times a day for the first 2 to 3 days. After that, ice and heat may be alternated to reduce pain and spasms.  Ask your caregiver about trying back exercises and gentle massage. This may be of some benefit.  Avoid feeling anxious or stressed.Stress increases muscle tension and can worsen back pain.It is important to recognize when you are anxious or stressed and learn ways to manage it.Exercise is a great option. SEEK MEDICAL CARE IF:  You have pain that is not relieved with rest or medicine.  You have pain that does not improve in 1 week.  You have new symptoms.  You are generally not feeling well. SEEK  IMMEDIATE MEDICAL CARE IF:   You have pain that radiates from your back into your legs.  You develop new bowel or bladder control problems.  You have unusual weakness or numbness in your arms or legs.  You develop nausea or vomiting.  You develop abdominal pain.  You feel faint. Document Released: 09/26/2005 Document Revised: 03/27/2012 Document Reviewed: 01/28/2014 [facility] Patient Information 2015 [place], [place]. This information is not intended to replace advice given to you by your health care provider. Make sure you  discuss any questions you have with your health care provider.  Cervical Strain and Sprain (Whiplash) with Rehab Cervical strain and sprain are injuries that commonly occur with "whiplash" injuries. Whiplash occurs when the neck is forcefully whipped backward or forward, such as during a motor vehicle accident or during contact sports. The muscles, ligaments, tendons, discs, and nerves of the neck are susceptible to injury when this occurs. RISK FACTORS Risk of having a whiplash injury increases if:  Osteoarthritis of the spine.  Situations that make head or neck accidents or trauma more likely.  High-risk sports (football, rugby, wrestling, hockey, auto racing, gymnastics, diving, contact karate, or boxing).  Poor strength and flexibility of the neck.  Previous neck injury.  Poor tackling technique.  Improperly fitted or padded equipment. SYMPTOMS   Pain or stiffness in the front or back of neck or both.  Symptoms may present immediately or up to 24 hours after injury.  Dizziness, headache, nausea, and vomiting.  Muscle spasm with soreness and stiffness in the neck.  Tenderness and swelling at the injury site. PREVENTION  Learn and use proper technique (avoid tackling with the head, spearing, and head-butting; use proper falling techniques to avoid landing on the head).  Warm up and stretch properly before activity.  Maintain physical fitness:  Strength, flexibility, and endurance.  Cardiovascular fitness.  Wear properly fitted and padded protective equipment, such as padded soft collars, for participation in contact sports. PROGNOSIS  Recovery from cervical strain and sprain injuries is dependent on the extent of the injury. These injuries are usually curable in 1 week to 3 months with appropriate treatment.  RELATED COMPLICATIONS   Temporary numbness and weakness may occur if the nerve roots are damaged, and this may persist until the nerve has completely  healed.  Chronic pain due to frequent recurrence of symptoms.  Prolonged healing, especially if activity is resumed too soon (before complete recovery). TREATMENT  Treatment initially involves the use of ice and medication to help reduce pain and inflammation. It is also important to perform strengthening and stretching exercises and modify activities that worsen symptoms so the injury does not get worse. These exercises may be performed at home or with a therapist. For patients who experience severe symptoms, a soft, padded collar may be recommended to be worn around the neck.  Improving your posture may help reduce symptoms. Posture improvement includes pulling your chin and abdomen in while sitting or standing. If you are sitting, sit in a firm chair with your buttocks against the back of the chair. While sleeping, try replacing your pillow with a small towel rolled to 2 inches in diameter, or use a cervical pillow or soft cervical collar. Poor sleeping positions delay healing.  For patients with nerve root damage, which causes numbness or weakness, the use of a cervical traction apparatus may be recommended. Surgery is rarely necessary for these injuries. However, cervical strain and sprains that are present at birth (congenital) may require surgery. MEDICATION  If pain medication is necessary, nonsteroidal anti-inflammatory medications, such as aspirin and ibuprofen, or other minor pain relievers, such as acetaminophen, are often recommended.  Do not take pain medication for 7 days before surgery.  Prescription pain relievers may be given if deemed necessary by your caregiver. Use only as directed and only as much as you need. HEAT AND COLD:   Cold treatment (icing) relieves pain and reduces inflammation. Cold treatment should be applied for 10 to 15 minutes every 2 to 3 hours for inflammation and pain and immediately after any activity that aggravates your symptoms. Use ice packs or an ice  massage.  Heat treatment may be used prior to performing the stretching and strengthening activities prescribed by your caregiver, physical therapist, or athletic trainer. Use a heat pack or a warm soak. SEEK MEDICAL CARE IF:   Symptoms get worse or do not improve in 2 weeks despite treatment.  New, unexplained symptoms develop (drugs used in treatment may produce side effects). EXERCISES RANGE OF MOTION (ROM) AND STRETCHING EXERCISES - Cervical Strain and Sprain These exercises may help you when beginning to rehabilitate your injury. In order to successfully resolve your symptoms, you must improve your posture. These exercises are designed to help reduce the forward-head and rounded-shoulder posture which contributes to this condition. Your symptoms may resolve with or without further involvement from your physician, physical therapist or athletic trainer. While completing these exercises, remember:   Restoring tissue flexibility helps normal motion to return to the joints. This allows healthier, less painful movement and activity.  An effective stretch should be held for at least 20 seconds, although you may need to begin with shorter hold times for comfort.  A stretch should never be painful. You should only feel a gentle lengthening or release in the stretched tissue. STRETCH- Axial Extensors  Lie on your back on the floor. You may bend your knees for comfort. Place a rolled-up hand towel or dish towel, about 2 inches in diameter, under the part of your head that makes contact with the floor.  Gently tuck your chin, as if trying to make a "double chin," until you feel a gentle stretch at the base of your head.  Hold __________ seconds. Repeat __________ times. Complete this exercise __________ times per day.  STRETCH - Axial Extension   Stand or sit on a firm surface. Assume a good posture: chest up, shoulders drawn back, abdominal muscles slightly tense, knees unlocked (if standing)  and feet hip width apart.  Slowly retract your chin so your head slides back and your chin slightly lowers. Continue to look straight ahead.  You should feel a gentle stretch in the back of your head. Be certain not to feel an aggressive stretch since this can cause headaches later.  Hold for __________ seconds. Repeat __________ times. Complete this exercise __________ times per day. STRETCH - Cervical Side Bend   Stand or sit on a firm surface. Assume a good posture: chest up, shoulders drawn back, abdominal muscles slightly tense, knees unlocked (if standing) and feet hip width apart.  Without letting your nose or shoulders move, slowly tip your right / left ear to your shoulder until your feel a gentle stretch in the muscles on the opposite side of your neck.  Hold __________ seconds. Repeat __________ times. Complete this exercise __________ times per day. STRETCH - Cervical Rotators   Stand or sit on a firm surface. Assume a good posture: chest up, shoulders drawn back, abdominal muscles slightly  tense, knees unlocked (if standing) and feet hip width apart.  Keeping your eyes level with the ground, slowly turn your head until you feel a gentle stretch along the back and opposite side of your neck.  Hold __________ seconds. Repeat __________ times. Complete this exercise __________ times per day. RANGE OF MOTION - Neck Circles   Stand or sit on a firm surface. Assume a good posture: chest up, shoulders drawn back, abdominal muscles slightly tense, knees unlocked (if standing) and feet hip width apart.  Gently roll your head down and around from the back of one shoulder to the back of the other. The motion should never be forced or painful.  Repeat the motion 10-20 times, or until you feel the neck muscles relax and loosen. Repeat __________ times. Complete the exercise __________ times per day. STRENGTHENING EXERCISES - Cervical Strain and Sprain These exercises may help you  when beginning to rehabilitate your injury. They may resolve your symptoms with or without further involvement from your physician, physical therapist, or athletic trainer. While completing these exercises, remember:   Muscles can gain both the endurance and the strength needed for everyday activities through controlled exercises.  Complete these exercises as instructed by your physician, physical therapist, or athletic trainer. Progress the resistance and repetitions only as guided.  You may experience muscle soreness or fatigue, but the pain or discomfort you are trying to eliminate should never worsen during these exercises. If this pain does worsen, stop and make certain you are following the directions exactly. If the pain is still present after adjustments, discontinue the exercise until you can discuss the trouble with your clinician. STRENGTH - Cervical Flexors, Isometric  Face a wall, standing about 6 inches away. Place a small pillow, a ball about 6-8 inches in diameter, or a folded towel between your forehead and the wall.  Slightly tuck your chin and gently push your forehead into the soft object. Push only with mild to moderate intensity, building up tension gradually. Keep your jaw and forehead relaxed.  Hold 10 to 20 seconds. Keep your breathing relaxed.  Release the tension slowly. Relax your neck muscles completely before you start the next repetition. Repeat __________ times. Complete this exercise __________ times per day. STRENGTH- Cervical Lateral Flexors, Isometric   Stand about 6 inches away from a wall. Place a small pillow, a ball about 6-8 inches in diameter, or a folded towel between the side of your head and the wall.  Slightly tuck your chin and gently tilt your head into the soft object. Push only with mild to moderate intensity, building up tension gradually. Keep your jaw and forehead relaxed.  Hold 10 to 20 seconds. Keep your breathing relaxed.  Release the  tension slowly. Relax your neck muscles completely before you start the next repetition. Repeat __________ times. Complete this exercise __________ times per day. STRENGTH - Cervical Extensors, Isometric   Stand about 6 inches away from a wall. Place a small pillow, a ball about 6-8 inches in diameter, or a folded towel between the back of your head and the wall.  Slightly tuck your chin and gently tilt your head back into the soft object. Push only with mild to moderate intensity, building up tension gradually. Keep your jaw and forehead relaxed.  Hold 10 to 20 seconds. Keep your breathing relaxed.  Release the tension slowly. Relax your neck muscles completely before you start the next repetition. Repeat __________ times. Complete this exercise __________ times per day. POSTURE  AND BODY MECHANICS CONSIDERATIONS - Cervical Strain and Sprain Keeping correct posture when sitting, standing or completing your activities will reduce the stress put on different body tissues, allowing injured tissues a chance to heal and limiting painful experiences. The following are general guidelines for improved posture. Your physician or physical therapist will provide you with any instructions specific to your needs. While reading these guidelines, remember:  The exercises prescribed by your provider will help you have the flexibility and strength to maintain correct postures.  The correct posture provides the optimal environment for your joints to work. All of your joints have less wear and tear when properly supported by a spine with good posture. This means you will experience a healthier, less painful body.  Correct posture must be practiced with all of your activities, especially prolonged sitting and standing. Correct posture is as important when doing repetitive low-stress activities (typing) as it is when doing a single heavy-load activity (lifting). PROLONGED STANDING WHILE SLIGHTLY LEANING FORWARD When  completing a task that requires you to lean forward while standing in one place for a long time, place either foot up on a stationary 2- to 4-inch high object to help maintain the best posture. When both feet are on the ground, the low back tends to lose its slight inward curve. If this curve flattens (or becomes too large), then the back and your other joints will experience too much stress, fatigue more quickly, and can cause pain.  RESTING POSITIONS Consider which positions are most painful for you when choosing a resting position. If you have pain with flexion-based activities (sitting, bending, stooping, squatting), choose a position that allows you to rest in a less flexed posture. You would want to avoid curling into a fetal position on your side. If your pain worsens with extension-based activities (prolonged standing, working overhead), avoid resting in an extended position such as sleeping on your stomach. Most people will find more comfort when they rest with their spine in a more neutral position, neither too rounded nor too arched. Lying on a non-sagging bed on your side with a pillow between your knees, or on your back with a pillow under your knees will often provide some relief. Keep in mind, being in any one position for a prolonged period of time, no matter how correct your posture, can still lead to stiffness. WALKING Walk with an upright posture. Your ears, shoulders, and hips should all line up. OFFICE WORK When working at a desk, create an environment that supports good, upright posture. Without extra support, muscles fatigue and lead to excessive strain on joints and other tissues. CHAIR:  A chair should be able to slide under your desk when your back makes contact with the back of the chair. This allows you to work closely.  The chair's height should allow your eyes to be level with the upper part of your monitor and your hands to be slightly lower than your elbows.  Body  position:  Your feet should make contact with the floor. If this is not possible, use a foot rest.  Keep your ears over your shoulders. This will reduce stress on your neck and low back. Document Released: 09/26/2005 Document Revised: 02/10/2014 Document Reviewed: 01/08/2009 [facility] Patient Information 2015 [place], [place]. This information is not intended to replace advice given to you by your health care provider. Make sure you discuss any questions you have with your health care provider.  Low Back Strain with Rehab A strain is an  injury in which a tendon or muscle is torn. The muscles and tendons of the lower back are vulnerable to strains. However, these muscles and tendons are very strong and require a great force to be injured. Strains are classified into three categories. Grade 1 strains cause pain, but the tendon is not lengthened. Grade 2 strains include a lengthened ligament, due to the ligament being stretched or partially ruptured. With grade 2 strains there is still function, although the function may be decreased. Grade 3 strains involve a complete tear of the tendon or muscle, and function is usually impaired. SYMPTOMS   Pain in the lower back.  Pain that affects one side more than the other.  Pain that gets worse with movement and may be felt in the hip, buttocks, or back of the thigh.  Muscle spasms of the muscles in the back.  Swelling along the muscles of the back.  Loss of strength of the back muscles.  Crackling sound (crepitation) when the muscles are touched. CAUSES  Lower back strains occur when a force is placed on the muscles or tendons that is greater than they can handle. Common causes of injury include:  Prolonged overuse of the muscle-tendon units in the lower back, usually from incorrect posture.  A single violent injury or force applied to the back. RISK INCREASES WITH:  Sports that involve twisting forces on the spine or a lot of bending at the  waist (football, rugby, weightlifting, bowling, golf, tennis, speed skating, racquetball, swimming, running, gymnastics, diving).  Poor strength and flexibility.  Failure to warm up properly before activity.  Family history of lower back pain or disk disorders.  Previous back injury or surgery (especially fusion).  Poor posture with lifting, especially heavy objects.  Prolonged sitting, especially with poor posture. PREVENTION   Learn and use proper posture when sitting or lifting (maintain proper posture when sitting, lift using the knees and legs, not at the waist).  Warm up and stretch properly before activity.  Allow for adequate recovery between workouts.  Maintain physical fitness:  Strength, flexibility, and endurance.  Cardiovascular fitness. PROGNOSIS  If treated properly, lower back strains usually heal within 6 weeks. RELATED COMPLICATIONS   Recurring symptoms, resulting in a chronic problem.  Chronic inflammation, scarring, and partial muscle-tendon tear.  Delayed healing or resolution of symptoms.  Prolonged disability. TREATMENT  Treatment first involves the use of ice and medicine, to reduce pain and inflammation. The use of strengthening and stretching exercises may help reduce pain with activity. These exercises may be performed at home or with a therapist. Severe injuries may require referral to a therapist for further evaluation and treatment, such as ultrasound. Your caregiver may advise that you wear a back brace or corset, to help reduce pain and discomfort. Often, prolonged bed rest results in greater harm then benefit. Corticosteroid injections may be recommended. However, these should be reserved for the most serious cases. It is important to avoid using your back when lifting objects. At night, sleep on your back on a firm mattress with a pillow placed under your knees. If non-surgical treatment is unsuccessful, surgery may be needed.  MEDICATION    If pain medicine is needed, nonsteroidal anti-inflammatory medicines (aspirin and ibuprofen), or other minor pain relievers (acetaminophen), are often advised.  Do not take pain medicine for 7 days before surgery.  Prescription pain relievers may be given, if your caregiver thinks they are needed. Use only as directed and only as much as you need.  Ointments applied to the skin may be helpful.  Corticosteroid injections may be given by your caregiver. These injections should be reserved for the most serious cases, because they may only be given a certain number of times. HEAT AND COLD  Cold treatment (icing) should be applied for 10 to 15 minutes every 2 to 3 hours for inflammation and pain, and immediately after activity that aggravates your symptoms. Use ice packs or an ice massage.  Heat treatment may be used before performing stretching and strengthening activities prescribed by your caregiver, physical therapist, or athletic trainer. Use a heat pack or a warm water soak. SEEK MEDICAL CARE IF:   Symptoms get worse or do not improve in 2 to 4 weeks, despite treatment.  You develop numbness, weakness, or loss of bowel or bladder function.  New, unexplained symptoms develop. (Drugs used in treatment may produce side effects.) EXERCISES  RANGE OF MOTION (ROM) AND STRETCHING EXERCISES - Low Back Strain Most people with lower back pain will find that their symptoms get worse with excessive bending forward (flexion) or arching at the lower back (extension). The exercises which will help resolve your symptoms will focus on the opposite motion.  Your physician, physical therapist or athletic trainer will help you determine which exercises will be most helpful to resolve your lower back pain. Do not complete any exercises without first consulting with your caregiver. Discontinue any exercises which make your symptoms worse until you speak to your caregiver.  If you have pain, numbness or  tingling which travels down into your buttocks, leg or foot, the goal of the therapy is for these symptoms to move closer to your back and eventually resolve. Sometimes, these leg symptoms will get better, but your lower back pain may worsen. This is typically an indication of progress in your rehabilitation. Be very alert to any changes in your symptoms and the activities in which you participated in the 24 hours prior to the change. Sharing this information with your caregiver will allow him/her to most efficiently treat your condition.  These exercises may help you when beginning to rehabilitate your injury. Your symptoms may resolve with or without further involvement from your physician, physical therapist or athletic trainer. While completing these exercises, remember:  Restoring tissue flexibility helps normal motion to return to the joints. This allows healthier, less painful movement and activity.  An effective stretch should be held for at least 30 seconds.  A stretch should never be painful. You should only feel a gentle lengthening or release in the stretched tissue. FLEXION RANGE OF MOTION AND STRETCHING EXERCISES: STRETCH - Flexion, Single Knee to Chest   Lie on a firm bed or floor with both legs extended in front of you.  Keeping one leg in contact with the floor, bring your opposite knee to your chest. Hold your leg in place by either grabbing behind your thigh or at your knee.  Pull until you feel a gentle stretch in your lower back. Hold __________ seconds.  Slowly release your grasp and repeat the exercise with the opposite side. Repeat __________ times. Complete this exercise __________ times per day.  STRETCH - Flexion, Double Knee to Chest   Lie on a firm bed or floor with both legs extended in front of you.  Keeping one leg in contact with the floor, bring your opposite knee to your chest.  Tense your stomach muscles to support your back and then lift your other knee  to your chest. Hold your  legs in place by either grabbing behind your thighs or at your knees.  Pull both knees toward your chest until you feel a gentle stretch in your lower back. Hold __________ seconds.  Tense your stomach muscles and slowly return one leg at a time to the floor. Repeat __________ times. Complete this exercise __________ times per day.  STRETCH - Low Trunk Rotation  Lie on a firm bed or floor. Keeping your legs in front of you, bend your knees so they are both pointed toward the ceiling and your feet are flat on the floor.  Extend your arms out to the side. This will stabilize your upper body by keeping your shoulders in contact with the floor.  Gently and slowly drop both knees together to one side until you feel a gentle stretch in your lower back. Hold for __________ seconds.  Tense your stomach muscles to support your lower back as you bring your knees back to the starting position. Repeat the exercise to the other side. Repeat __________ times. Complete this exercise __________ times per day  EXTENSION RANGE OF MOTION AND FLEXIBILITY EXERCISES: STRETCH - Extension, Prone on Elbows   Lie on your stomach on the floor, a bed will be too soft. Place your palms about shoulder width apart and at the height of your head.  Place your elbows under your shoulders. If this is too painful, stack pillows under your chest.  Allow your body to relax so that your hips drop lower and make contact more completely with the floor.  Hold this position for __________ seconds.  Slowly return to lying flat on the floor. Repeat __________ times. Complete this exercise __________ times per day.  RANGE OF MOTION - Extension, Prone Press Ups  Lie on your stomach on the floor, a bed will be too soft. Place your palms about shoulder width apart and at the height of your head.  Keeping your back as relaxed as possible, slowly straighten your elbows while keeping your hips on the floor.  You may adjust the placement of your hands to maximize your comfort. As you gain motion, your hands will come more underneath your shoulders.  Hold this position __________ seconds.  Slowly return to lying flat on the floor. Repeat __________ times. Complete this exercise __________ times per day.  RANGE OF MOTION- Quadruped, Neutral Spine   Assume a hands and knees position on a firm surface. Keep your hands under your shoulders and your knees under your hips. You may place padding under your knees for comfort.  Drop your head and point your tail bone toward the ground below you. This will round out your lower back like an angry cat. Hold this position for __________ seconds.  Slowly lift your head and release your tail bone so that your back sags into a large arch, like an old horse.  Hold this position for __________ seconds.  Repeat this until you feel limber in your lower back.  Now, find your "sweet spot." This will be the most comfortable position somewhere between the two previous positions. This is your neutral spine. Once you have found this position, tense your stomach muscles to support your lower back.  Hold this position for __________ seconds. Repeat __________ times. Complete this exercise __________ times per day.  STRENGTHENING EXERCISES - Low Back Strain These exercises may help you when beginning to rehabilitate your injury. These exercises should be done near your "sweet spot." This is the neutral, low-back arch, somewhere between fully  rounded and fully arched, that is your least painful position. When performed in this safe range of motion, these exercises can be used for people who have either a flexion or extension based injury. These exercises may resolve your symptoms with or without further involvement from your physician, physical therapist or athletic trainer. While completing these exercises, remember:   Muscles can gain both the endurance and the strength  needed for everyday activities through controlled exercises.  Complete these exercises as instructed by your physician, physical therapist or athletic trainer. Increase the resistance and repetitions only as guided.  You may experience muscle soreness or fatigue, but the pain or discomfort you are trying to eliminate should never worsen during these exercises. If this pain does worsen, stop and make certain you are following the directions exactly. If the pain is still present after adjustments, discontinue the exercise until you can discuss the trouble with your caregiver. STRENGTHENING - Deep Abdominals, Pelvic Tilt  Lie on a firm bed or floor. Keeping your legs in front of you, bend your knees so they are both pointed toward the ceiling and your feet are flat on the floor.  Tense your lower abdominal muscles to press your lower back into the floor. This motion will rotate your pelvis so that your tail bone is scooping upwards rather than pointing at your feet or into the floor.  With a gentle tension and even breathing, hold this position for __________ seconds. Repeat __________ times. Complete this exercise __________ times per day.  STRENGTHENING - Abdominals, Crunches   Lie on a firm bed or floor. Keeping your legs in front of you, bend your knees so they are both pointed toward the ceiling and your feet are flat on the floor. Cross your arms over your chest.  Slightly tip your chin down without bending your neck.  Tense your abdominals and slowly lift your trunk high enough to just clear your shoulder blades. Lifting higher can put excessive stress on the lower back and does not further strengthen your abdominal muscles.  Control your return to the starting position. Repeat __________ times. Complete this exercise __________ times per day.  STRENGTHENING - Quadruped, Opposite UE/LE Lift   Assume a hands and knees position on a firm surface. Keep your hands under your shoulders and  your knees under your hips. You may place padding under your knees for comfort.  Find your neutral spine and gently tense your abdominal muscles so that you can maintain this position. Your shoulders and hips should form a rectangle that is parallel with the floor and is not twisted.  Keeping your trunk steady, lift your right hand no higher than your shoulder and then your left leg no higher than your hip. Make sure you are not holding your breath. Hold this position __________ seconds.  Continuing to keep your abdominal muscles tense and your back steady, slowly return to your starting position. Repeat with the opposite arm and leg. Repeat __________ times. Complete this exercise __________ times per day.  STRENGTHENING - Lower Abdominals, Double Knee Lift  Lie on a firm bed or floor. Keeping your legs in front of you, bend your knees so they are both pointed toward the ceiling and your feet are flat on the floor.  Tense your abdominal muscles to brace your lower back and slowly lift both of your knees until they come over your hips. Be certain not to hold your breath.  Hold __________ seconds. Using your abdominal muscles, return to  the starting position in a slow and controlled manner. Repeat __________ times. Complete this exercise __________ times per day.  POSTURE AND BODY MECHANICS CONSIDERATIONS - Low Back Strain Keeping correct posture when sitting, standing or completing your activities will reduce the stress put on different body tissues, allowing injured tissues a chance to heal and limiting painful experiences. The following are general guidelines for improved posture. Your physician or physical therapist will provide you with any instructions specific to your needs. While reading these guidelines, remember:  The exercises prescribed by your provider will help you have the flexibility and strength to maintain correct postures.  The correct posture provides the best environment for  your joints to work. All of your joints have less wear and tear when properly supported by a spine with good posture. This means you will experience a healthier, less painful body.  Correct posture must be practiced with all of your activities, especially prolonged sitting and standing. Correct posture is as important when doing repetitive low-stress activities (typing) as it is when doing a single heavy-load activity (lifting). RESTING POSITIONS Consider which positions are most painful for you when choosing a resting position. If you have pain with flexion-based activities (sitting, bending, stooping, squatting), choose a position that allows you to rest in a less flexed posture. You would want to avoid curling into a fetal position on your side. If your pain worsens with extension-based activities (prolonged standing, working overhead), avoid resting in an extended position such as sleeping on your stomach. Most people will find more comfort when they rest with their spine in a more neutral position, neither too rounded nor too arched. Lying on a non-sagging bed on your side with a pillow between your knees, or on your back with a pillow under your knees will often provide some relief. Keep in mind, being in any one position for a prolonged period of time, no matter how correct your posture, can still lead to stiffness. PROPER SITTING POSTURE In order to minimize stress and discomfort on your spine, you must sit with correct posture. Sitting with good posture should be effortless for a healthy body. Returning to good posture is a gradual process. Many people can work toward this most comfortably by using various supports until they have the flexibility and strength to maintain this posture on their own. When sitting with proper posture, your ears will fall over your shoulders and your shoulders will fall over your hips. You should use the back of the chair to support your upper back. Your lower back will  be in a neutral position, just slightly arched. You may place a small pillow or folded towel at the base of your lower back for support.  When working at a desk, create an environment that supports good, upright posture. Without extra support, muscles tire, which leads to excessive strain on joints and other tissues. Keep these recommendations in mind: CHAIR:  A chair should be able to slide under your desk when your back makes contact with the back of the chair. This allows you to work closely.  The chair's height should allow your eyes to be level with the upper part of your monitor and your hands to be slightly lower than your elbows. BODY POSITION  Your feet should make contact with the floor. If this is not possible, use a foot rest.  Keep your ears over your shoulders. This will reduce stress on your neck and lower back. INCORRECT SITTING POSTURES  If you are  feeling tired and unable to assume a healthy sitting posture, do not slouch or slump. This puts excessive strain on your back tissues, causing more damage and pain. Healthier options include:  Using more support, like a lumbar pillow.  Switching tasks to something that requires you to be upright or walking.  Talking a brief walk.  Lying down to rest in a neutral-spine position. PROLONGED STANDING WHILE SLIGHTLY LEANING FORWARD  When completing a task that requires you to lean forward while standing in one place for a long time, place either foot up on a stationary 2-4 inch high object to help maintain the best posture. When both feet are on the ground, the lower back tends to lose its slight inward curve. If this curve flattens (or becomes too large), then the back and your other joints will experience too much stress, tire more quickly, and can cause pain. CORRECT STANDING POSTURES Proper standing posture should be assumed with all daily activities, even if they only take a few moments, like when brushing your teeth. As in  sitting, your ears should fall over your shoulders and your shoulders should fall over your hips. You should keep a slight tension in your abdominal muscles to brace your spine. Your tailbone should point down to the ground, not behind your body, resulting in an over-extended swayback posture.  INCORRECT STANDING POSTURES  Common incorrect standing postures include a forward head, locked knees and/or an excessive swayback. WALKING Walk with an upright posture. Your ears, shoulders and hips should all line-up. PROLONGED ACTIVITY IN A FLEXED POSITION When completing a task that requires you to bend forward at your waist or lean over a low surface, try to find a way to stabilize 3 out of 4 of your limbs. You can place a hand or elbow on your thigh or rest a knee on the surface you are reaching across. This will provide you more stability so that your muscles do not fatigue as quickly. By keeping your knees relaxed, or slightly bent, you will also reduce stress across your lower back. CORRECT LIFTING TECHNIQUES DO :   Assume a wide stance. This will provide you more stability and the opportunity to get as close as possible to the object which you are lifting.  Tense your abdominals to brace your spine. Bend at the knees and hips. Keeping your back locked in a neutral-spine position, lift using your leg muscles. Lift with your legs, keeping your back straight.  Test the weight of unknown objects before attempting to lift them.  Try to keep your elbows locked down at your sides in order get the best strength from your shoulders when carrying an object.  Always ask for help when lifting heavy or awkward objects. INCORRECT LIFTING TECHNIQUES DO NOT:   Lock your knees when lifting, even if it is a small object.  Bend and twist. Pivot at your feet or move your feet when needing to change directions.  Assume that you can safely pick up even a paper clip without proper posture. Document Released:  09/26/2005 Document Revised: 12/19/2011 Document Reviewed: 01/08/2009 [facility] Patient Information 2015 [place], [place]. This information is not intended to replace advice given to you by your health care provider. Make sure you discuss any questions you have with your health care provider.
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
## 2015-07-03 LAB — URINE CULTURE: Special Requests: NORMAL

## 2015-07-03 NOTE — ED Notes (Signed)
Final report of urine C&S shows multiple species

## 2015-08-05 ENCOUNTER — Ambulatory Visit: Payer: Self-pay | Attending: Internal Medicine

## 2015-08-08 IMAGING — CR DG ANKLE COMPLETE 3+V*L*
3 series · 3 of 3 positions shown · non-contrast
Comparison: 04/22/2014 foot radiographs

CLINICAL DATA: Left foot and ankle pain, no known injury.

EXAM:
LEFT ANKLE COMPLETE - 3+ VIEW

[view not recorded (1 of 3)]
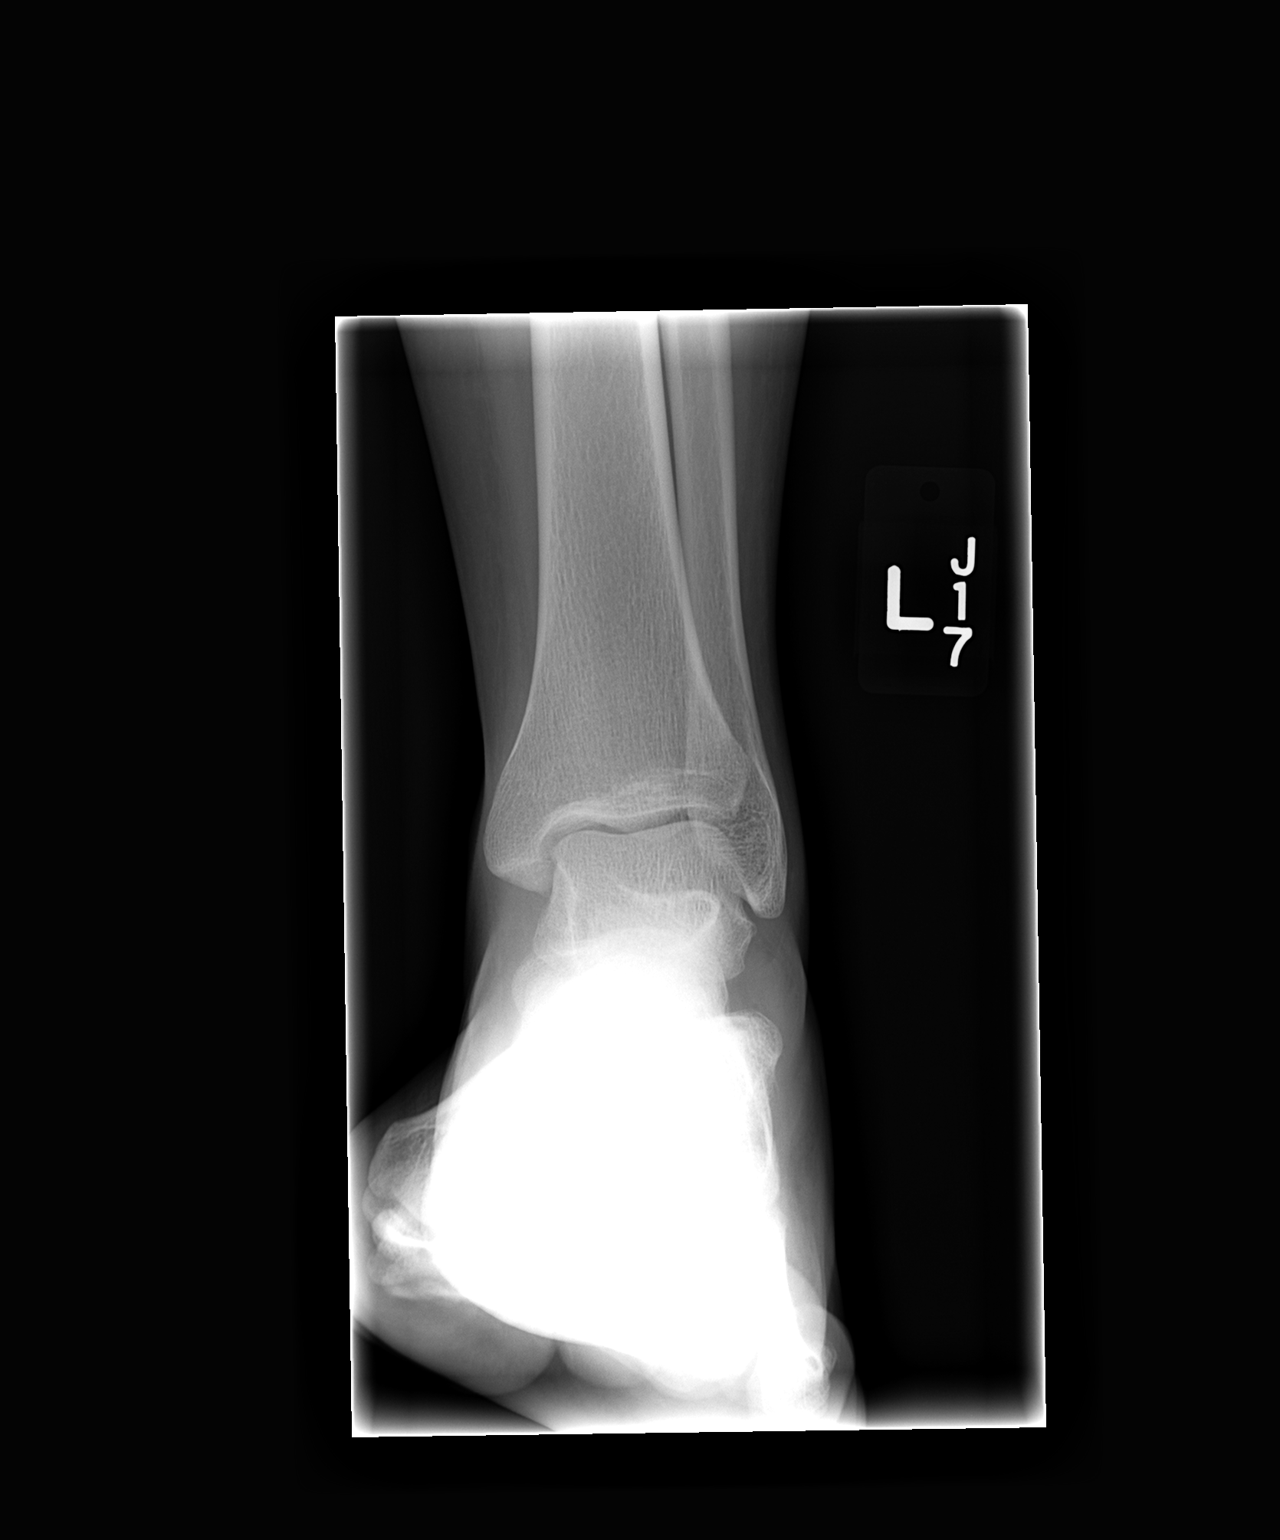

[view not recorded (2 of 3)]
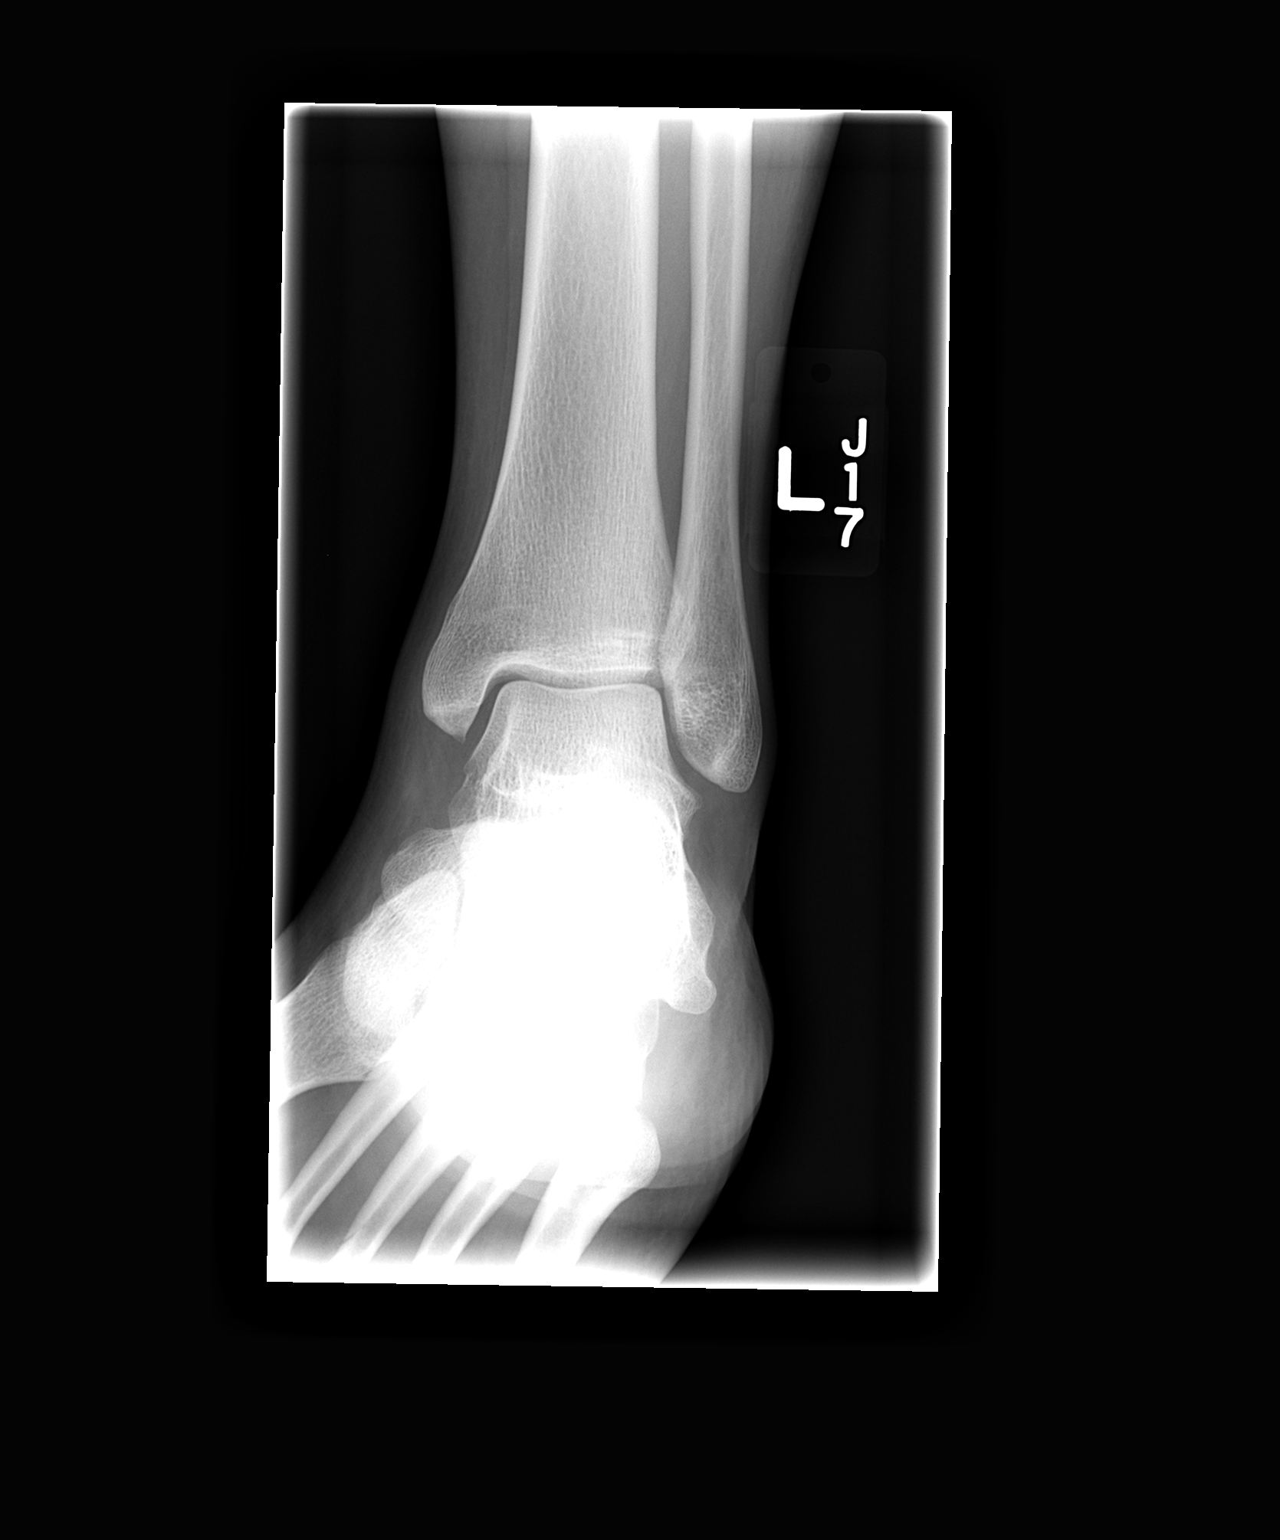

[view not recorded (3 of 3)]
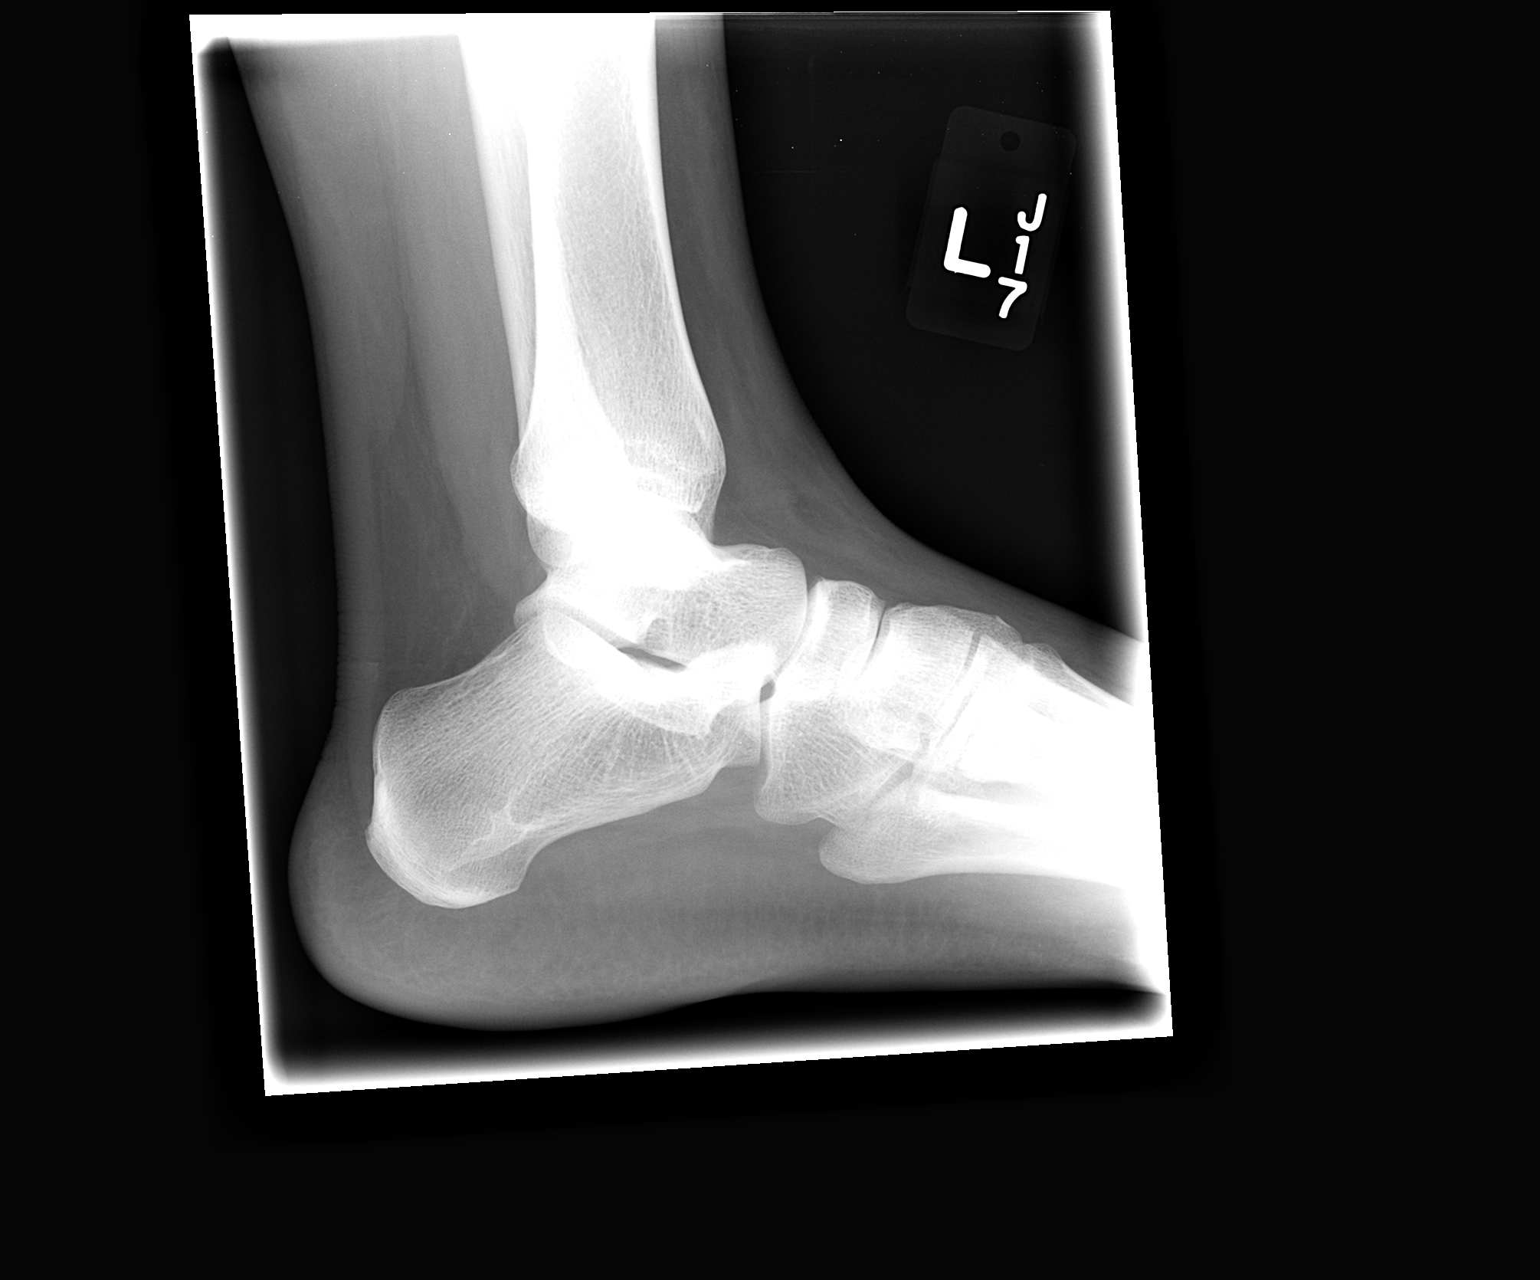

[3 of 3 positions shown; findings below may reference images not displayed]

FINDINGS: Ankle mortise intact. No displaced fracture or dislocation. No
aggressive osseous lesion or overt degenerative change.
IMPRESSION: No acute or aggressive osseous finding of the left ankle.
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
## 2015-11-22 ENCOUNTER — Inpatient Hospital Stay (HOSPITAL_COMMUNITY)
Admission: AD | Admit: 2015-11-22 | Discharge: 2015-11-22 | Disposition: A | Discharge status: Home / Self Care | Payer: Medicaid Other | Source: Ambulatory Visit | Attending: Obstetrics & Gynecology | Admitting: Obstetrics & Gynecology

## 2015-11-22 ENCOUNTER — Inpatient Hospital Stay (HOSPITAL_COMMUNITY): Payer: Medicaid Other

## 2015-11-22 ENCOUNTER — Encounter (HOSPITAL_COMMUNITY): Payer: Self-pay

## 2015-11-22 DIAGNOSIS — F329 Major depressive disorder, single episode, unspecified: Secondary | ICD-10-CM | POA: Insufficient documentation

## 2015-11-22 DIAGNOSIS — R109 Unspecified abdominal pain: Secondary | ICD-10-CM | POA: Diagnosis present

## 2015-11-22 DIAGNOSIS — O9989 Other specified diseases and conditions complicating pregnancy, childbirth and the puerperium: Secondary | ICD-10-CM | POA: Diagnosis not present

## 2015-11-22 DIAGNOSIS — B9689 Other specified bacterial agents as the cause of diseases classified elsewhere: Secondary | ICD-10-CM | POA: Diagnosis not present

## 2015-11-22 DIAGNOSIS — O3411 Maternal care for benign tumor of corpus uteri, first trimester: Secondary | ICD-10-CM

## 2015-11-22 DIAGNOSIS — N760 Acute vaginitis: Secondary | ICD-10-CM | POA: Diagnosis not present

## 2015-11-22 DIAGNOSIS — O99331 Smoking (tobacco) complicating pregnancy, first trimester: Secondary | ICD-10-CM | POA: Diagnosis not present

## 2015-11-22 DIAGNOSIS — A499 Bacterial infection, unspecified: Secondary | ICD-10-CM

## 2015-11-22 DIAGNOSIS — O23591 Infection of other part of genital tract in pregnancy, first trimester: Secondary | ICD-10-CM | POA: Diagnosis not present

## 2015-11-22 DIAGNOSIS — M549 Dorsalgia, unspecified: Secondary | ICD-10-CM | POA: Diagnosis present

## 2015-11-22 DIAGNOSIS — O26891 Other specified pregnancy related conditions, first trimester: Secondary | ICD-10-CM | POA: Diagnosis not present

## 2015-11-22 DIAGNOSIS — O26899 Other specified pregnancy related conditions, unspecified trimester: Secondary | ICD-10-CM

## 2015-11-22 DIAGNOSIS — D251 Intramural leiomyoma of uterus: Secondary | ICD-10-CM | POA: Diagnosis not present

## 2015-11-22 DIAGNOSIS — D259 Leiomyoma of uterus, unspecified: Secondary | ICD-10-CM

## 2015-11-22 DIAGNOSIS — Z3491 Encounter for supervision of normal pregnancy, unspecified, first trimester: Secondary | ICD-10-CM

## 2015-11-22 LAB — URINALYSIS, ROUTINE W REFLEX MICROSCOPIC
Bilirubin Urine: NEGATIVE
Glucose, UA: NEGATIVE mg/dL
HGB URINE DIPSTICK: NEGATIVE
Ketones, ur: NEGATIVE mg/dL
Leukocytes, UA: NEGATIVE
NITRITE: NEGATIVE
PH: 6 (ref 5.0–8.0)
Protein, ur: NEGATIVE mg/dL
SPECIFIC GRAVITY, URINE: 1.02 (ref 1.005–1.030)

## 2015-11-22 LAB — CBC
HEMATOCRIT: 36.6 % (ref 36.0–46.0)
HEMOGLOBIN: 12.7 g/dL (ref 12.0–15.0)
MCH: 29.5 pg (ref 26.0–34.0)
MCHC: 34.7 g/dL (ref 30.0–36.0)
MCV: 84.9 fL (ref 78.0–100.0)
Platelets: 202 10*3/uL (ref 150–400)
RBC: 4.31 MIL/uL (ref 3.87–5.11)
RDW: 12.2 % (ref 11.5–15.5)
WBC: 11.6 10*3/uL — ABNORMAL HIGH (ref 4.0–10.5)

## 2015-11-22 LAB — POCT PREGNANCY, URINE: PREG TEST UR: POSITIVE — AB

## 2015-11-22 LAB — WET PREP, GENITAL
Sperm: NONE SEEN
Trich, Wet Prep: NONE SEEN
Yeast Wet Prep HPF POC: NONE SEEN

## 2015-11-22 LAB — ABO/RH: ABO/RH(D): B POS

## 2015-11-22 LAB — HCG, QUANTITATIVE, PREGNANCY: hCG, Beta Chain, Quant, S: 13713 m[IU]/mL — ABNORMAL HIGH (ref <5)

## 2015-11-22 MED ORDER — METRONIDAZOLE 500 MG PO TABS: 500.0000 mg | ORAL_TABLET | Freq: Two times a day (BID) | ORAL | Status: DC

## 2015-11-22 NOTE — Discharge Instructions (Signed)
First Trimester of Pregnancy  The first trimester of pregnancy is from week 1 until the end of week 12 (months 1 through 3). A week after a sperm fertilizes an egg, the egg will implant on the wall of the uterus. This embryo will begin to develop into a baby. Genes from you and your partner are forming the baby. The female genes determine whether the baby is a boy or a girl. At 6-8 weeks, the eyes and face are formed, and the heartbeat can be seen on ultrasound. At the end of 12 weeks, all the baby's organs are formed.   Now that you are pregnant, you will want to do everything you can to have a healthy baby. Two of the most important things are to get good prenatal care and to follow your health care provider's instructions. Prenatal care is all the medical care you receive before the baby's birth. This care will help prevent, find, and treat any problems during the pregnancy and childbirth.  BODY CHANGES  Your body goes through many changes during pregnancy. The changes vary from woman to woman.   · You may gain or lose a couple of pounds at first.  · You may feel sick to your stomach (nauseous) and throw up (vomit). If the vomiting is uncontrollable, call your health care provider.  · You may tire easily.  · You may develop headaches that can be relieved by medicines approved by your health care provider.  · You may urinate more often. Painful urination may mean you have a bladder infection.  · You may develop heartburn as a result of your pregnancy.  · You may develop constipation because certain hormones are causing the muscles that push waste through your intestines to slow down.  · You may develop hemorrhoids or swollen, bulging veins (varicose veins).  · Your breasts may begin to grow larger and become tender. Your nipples may stick out more, and the tissue that surrounds them (areola) may become darker.  · Your gums may bleed and may be sensitive to brushing and flossing.   · Dark spots or blotches (chloasma, mask of pregnancy) may develop on your face. This will likely fade after the baby is born.  · Your menstrual periods will stop.  · You may have a loss of appetite.  · You may develop cravings for certain kinds of food.  · You may have changes in your emotions from day to day, such as being excited to be pregnant or being concerned that something may go wrong with the pregnancy and baby.  · You may have more vivid and strange dreams.  · You may have changes in your hair. These can include thickening of your hair, rapid growth, and changes in texture. Some women also have hair loss during or after pregnancy, or hair that feels dry or thin. Your hair will most likely return to normal after your baby is born.  WHAT TO EXPECT AT YOUR PRENATAL VISITS  During a routine prenatal visit:  · You will be weighed to make sure you and the baby are growing normally.  · Your blood pressure will be taken.  · Your abdomen will be measured to track your baby's growth.  · The fetal heartbeat will be listened to starting around week 10 or 12 of your pregnancy.  · Test results from any previous visits will be discussed.  Your health care provider may ask you:  · How you are feeling.  · If you   including cigarettes, chewing tobacco, and electronic cigarettes. °· If you have any questions. °Other tests that may be performed during your first trimester include: °· Blood tests to find your blood type and to check for the presence of any previous infections. They will also be used to check for low iron levels (anemia) and Rh antibodies. Later in the pregnancy, blood tests for diabetes will be done along with other tests if problems develop. °· Urine tests to check for infections, diabetes, or protein in the urine. °· An ultrasound to confirm the proper growth  and development of the baby. °· An amniocentesis to check for possible genetic problems. °· Fetal screens for spina bifida and Down syndrome. °· You may need other tests to make sure you and the baby are doing well. °· HIV (human immunodeficiency virus) testing. Routine prenatal testing includes screening for HIV, unless you choose not to have this test. °HOME CARE INSTRUCTIONS  °Medicines °· Follow your health care provider's instructions regarding medicine use. Specific medicines may be either safe or unsafe to take during pregnancy. °· Take your prenatal vitamins as directed. °· If you develop constipation, try taking a stool softener if your health care provider approves. °Diet °· Eat regular, well-balanced meals. Choose a variety of foods, such as meat or vegetable-based protein, fish, milk and low-fat dairy products, vegetables, fruits, and whole grain breads and cereals. Your health care provider will help you determine the amount of weight gain that is right for you. °· Avoid raw meat and uncooked cheese. These carry germs that can cause birth defects in the baby. °· Eating four or five small meals rather than three large meals a day may help relieve nausea and vomiting. If you start to feel nauseous, eating a few soda crackers can be helpful. Drinking liquids between meals instead of during meals also seems to help nausea and vomiting. °· If you develop constipation, eat more high-fiber foods, such as fresh vegetables or fruit and whole grains. Drink enough fluids to keep your urine clear or pale yellow. °Activity and Exercise °· Exercise only as directed by your health care provider. Exercising will help you: °¨ Control your weight. °¨ Stay in shape. °¨ Be prepared for labor and delivery. °· Experiencing pain or cramping in the lower abdomen or low back is a good sign that you should stop exercising. Check with your health care provider before continuing normal exercises. °· Try to avoid standing for long  periods of time. Move your legs often if you must stand in one place for a long time. °· Avoid heavy lifting. °· Wear low-heeled shoes, and practice good posture. °· You may continue to have sex unless your health care provider directs you otherwise. °Relief of Pain or Discomfort °· Wear a good support bra for breast tenderness.   °· Take warm sitz baths to soothe any pain or discomfort caused by hemorrhoids. Use hemorrhoid cream if your health care provider approves.   °· Rest with your legs elevated if you have leg cramps or low back pain. °· If you develop varicose veins in your legs, wear support hose. Elevate your feet for 15 minutes, 3-4 times a day. Limit salt in your diet. °Prenatal Care °· Schedule your prenatal visits by the twelfth week of pregnancy. They are usually scheduled monthly at first, then more often in the last 2 months before delivery. °· Write down your questions. Take them to your prenatal visits. °· Keep all your prenatal visits as directed by your   health care provider. °Safety °· Wear your seat belt at all times when driving. °· Make a list of emergency phone numbers, including numbers for family, friends, the hospital, and police and fire departments. °General Tips °· Ask your health care provider for a referral to a local prenatal education class. Begin classes no later than at the beginning of month 6 of your pregnancy. °· Ask for help if you have counseling or nutritional needs during pregnancy. Your health care provider can offer advice or refer you to specialists for help with various needs. °· Do not use hot tubs, steam rooms, or saunas. °· Do not douche or use tampons or scented sanitary pads. °· Do not cross your legs for long periods of time. °· Avoid cat litter boxes and soil used by cats. These carry germs that can cause birth defects in the baby and possibly loss of the fetus by miscarriage or stillbirth. °· Avoid all smoking, herbs, alcohol, and medicines not prescribed by  your health care provider. Chemicals in these affect the formation and growth of the baby. °· Do not use any tobacco products, including cigarettes, chewing tobacco, and electronic cigarettes. If you need help quitting, ask your health care provider. You may receive counseling support and other resources to help you quit. °· Schedule a dentist appointment. At home, brush your teeth with a soft toothbrush and be gentle when you floss. °SEEK MEDICAL CARE IF:  °· You have dizziness. °· You have mild pelvic cramps, pelvic pressure, or nagging pain in the abdominal area. °· You have persistent nausea, vomiting, or diarrhea. °· You have a bad smelling vaginal discharge. °· You have pain with urination. °· You notice increased swelling in your face, hands, legs, or ankles. °SEEK IMMEDIATE MEDICAL CARE IF:  °· You have a fever. °· You are leaking fluid from your vagina. °· You have spotting or bleeding from your vagina. °· You have severe abdominal cramping or pain. °· You have rapid weight gain or loss. °· You vomit blood or material that looks like coffee grounds. °· You are exposed to German measles and have never had them. °· You are exposed to fifth disease or chickenpox. °· You develop a severe headache. °· You have shortness of breath. °· You have any kind of trauma, such as from a fall or a car accident. °  °This information is not intended to replace advice given to you by your health care provider. Make sure you discuss any questions you have with your health care provider. °  °Document Released: 09/20/2001 Document Revised: 10/17/2014 Document Reviewed: 08/06/2013 °Elsevier Interactive Patient Education ©2016 [employer] °Bacterial Vaginosis °Bacterial vaginosis is a vaginal infection that occurs when the normal balance of bacteria in the vagina is disrupted. It results from an overgrowth of certain bacteria. This is the most common vaginal infection in women of childbearing age. Treatment is important to  prevent complications, especially in pregnant women, as it can cause a premature delivery. °CAUSES  °Bacterial vaginosis is caused by an increase in harmful bacteria that are normally present in smaller amounts in the vagina. Several different kinds of bacteria can cause bacterial vaginosis. However, the reason that the condition develops is not fully understood. °RISK FACTORS °Certain activities or behaviors can put you at an increased risk of developing bacterial vaginosis, including: °· Having a new sex partner or multiple sex partners. °· Douching. °· Using an intrauterine device (IUD) for contraception. °Women do not get bacterial vaginosis from toilet seats,   bedding, swimming pools, or contact with objects around them. SIGNS AND SYMPTOMS  Some women with bacterial vaginosis have no signs or symptoms. Common symptoms include:  Grey vaginal discharge.  A fishlike odor with discharge, especially after sexual intercourse.  Itching or burning of the vagina and vulva.  Burning or pain with urination. DIAGNOSIS  Your health care provider will take a medical history and examine the vagina for signs of bacterial vaginosis. A sample of vaginal fluid may be taken. Your health care provider will look at this sample under a microscope to check for bacteria and abnormal cells. A vaginal pH test may also be done.  TREATMENT  Bacterial vaginosis may be treated with antibiotic medicines. These may be given in the form of a pill or a vaginal cream. A second round of antibiotics may be prescribed if the condition comes back after treatment. Because bacterial vaginosis increases your risk for sexually transmitted diseases, getting treated can help reduce your risk for chlamydia, gonorrhea, HIV, and herpes. HOME CARE INSTRUCTIONS   Only take over-the-counter or prescription medicines as directed by your health care provider.  If antibiotic medicine was prescribed, take it as directed. Make sure you finish it  even if you start to feel better.  Tell all sexual partners that you have a vaginal infection. They should see their health care provider and be treated if they have problems, such as a mild rash or itching.  During treatment, it is important that you follow these instructions:  Avoid sexual activity or use condoms correctly.  Do not douche.  Avoid alcohol as directed by your health care provider.  Avoid breastfeeding as directed by your health care provider. SEEK MEDICAL CARE IF:   Your symptoms are not improving after 3 days of treatment.  You have increased discharge or pain.  You have a fever. MAKE SURE YOU:   Understand these instructions.  Will watch your condition.  Will get help right away if you are not doing well or get worse. FOR MORE INFORMATION  Centers for Disease Control and Prevention, Division of STD Prevention: [employer] American Sexual Health Association (ASHA): www.ashastd.org    This information is not intended to replace advice given to you by your health care provider. Make sure you discuss any questions you have with your health care provider.   Document Released: 09/26/2005 Document Revised: 10/17/2014 Document Reviewed: 05/08/2013 Elsevier Interactive Patient Education 2016 Else[employer] Inc[employer] Uterine Fibroids Uterine fibroids are tissue masses (tumors) that can develop in the womb (uterus). They are also called leiomyomas. This type of tumor is not cancerous (benign) and does not spread to other parts of the body outside of the pelvic area, which is between the hip bones. Occasionally, fibroids may develop in the fallopian tubes, in the cervix, or on the support structures (ligaments) that surround the uterus. You can have one or many fibroids. Fibroids can vary in size, weight, and where they grow in the uterus. Some can become quite large. Most fibroids do not require medical treatment. CAUSES A fibroid can develop when a single uterine cell keeps  growing (replicating). Most cells in the human body have a control mechanism that keeps them from replicating without control. SIGNS AND SYMPTOMS Symptoms may include:   Heavy bleeding during your period.  Bleeding or spotting between periods.  Pelvic pain and pressure.  Bladder problems, such as needing to urinate more often (urinary frequency) or urgently.  Inability to reproduce offspring (infertility).  Miscarriages. DIAGNOSIS Uterine fibroids are diagnosed  through a physical exam. Your health care provider may feel the lumpy tumors during a pelvic exam. Ultrasonography and an MRI may be done to determine the size, location, and number of fibroids. TREATMENT Treatment may include:  Watchful waiting. This involves getting the fibroid checked by your health care provider to see if it grows or shrinks. Follow your health care provider's recommendations for how often to have this checked.  Hormone medicines. These can be taken by mouth or given through an intrauterine device (IUD).  Surgery.  Removing the fibroids (myomectomy) or the uterus (hysterectomy).  Removing blood supply to the fibroids (uterine artery embolization). If fibroids interfere with your fertility and you want to become pregnant, your health care provider may recommend having the fibroids removed.  HOME CARE INSTRUCTIONS  Keep all follow-up visits as directed by your health care provider. This is important.  Take medicines only as directed by your health care provider.  If you were prescribed a hormone treatment, take the hormone medicines exactly as directed.  Do not take aspirin, because it can cause bleeding.  Ask your health care provider about taking iron pills and increasing the amount of dark green, leafy vegetables in your diet. These actions can help to boost your blood iron levels, which may be affected by heavy menstrual bleeding.  Pay close attention to your period and tell your health care  provider about any changes, such as:  Increased blood flow that requires you to use more pads or tampons than usual per month.  A change in the number of days that your period lasts per month.  A change in symptoms that are associated with your period, such as abdominal cramping or back pain. SEEK MEDICAL CARE IF:  You have pelvic pain, back pain, or abdominal cramps that cannot be controlled with medicines.  You have an increase in bleeding between and during periods.  You soak tampons or pads in a half hour or less.  You feel lightheaded, extra tired, or weak. SEEK IMMEDIATE MEDICAL CARE IF:  You faint.  You have a sudden increase in pelvic pain.   This information is not intended to replace advice given to you by your health care provider. Make sure you discuss any questions you have with your health care provider.   Document Released: 09/23/2000 Document Revised: 10/17/2014 Document Reviewed: 03/25/2014 Elsevier Interactive Patient Education 2016 [employer] Medications in Pregnancy   Acne: Benzoyl Peroxide Salicylic Acid  Backache/Headache: Tylenol: 2 regular strength every 4 hours OR              2 Extra strength every 6 hours  Colds/Coughs/Allergies: Benadryl (alcohol free) 25 mg every 6 hours as needed Breath right strips Claritin Cepacol throat lozenges Chloraseptic throat spray Cold-Eeze- up to three times per day Cough drops, alcohol free Flonase (by prescription only) Guaifenesin Mucinex Robitussin DM (plain only, alcohol free) Saline nasal spray/drops Sudafed (pseudoephedrine) & Actifed ** use only after [redacted] weeks gestation and if you do not have high blood pressure Tylenol Vicks Vaporub Zinc lozenges Zyrtec   Constipation: Colace Ducolax suppositories Fleet enema Glycerin suppositories Metamucil Milk of magnesia Miralax Senokot Smooth move tea  Diarrhea: Kaopectate Imodium A-D  *NO pepto  Bismol  Hemorrhoids: Anusol Anusol HC Preparation H Tucks  Indigestion: Tums Maalox Mylanta Zantac  Pepcid  Insomnia: Benadryl (alcohol free) 25mg  every 6 hours as needed Tylenol PM Unisom, no Gelcaps  Leg Cramps: Tums MagGel  Nausea/Vomiting:  Bonine Dramamine Emetrol Ginger extract  Sea bands Meclizine  Nausea medication to take during pregnancy:  Unisom (doxylamine succinate 25 mg tablets) Take one tablet daily at bedtime. If symptoms are not adequately controlled, the dose can be increased to a maximum recommended dose of two tablets daily (1/2 tablet in the morning, 1/2 tablet mid-afternoon and one at bedtime). Vitamin B6 100mg  tablets. Take one tablet twice a day (up to 200 mg per day).  Skin Rashes: Aveeno products Benadryl cream or 25mg  every 6 hours as needed Calamine Lotion 1% cortisone cream  Yeast infection: Gyne-lotrimin 7 Monistat 7  Gum/tooth pain: Anbesol  **If taking multiple medications, please check labels to avoid duplicating the same active ingredients **take medication as directed on the label ** Do not exceed 4000 mg of tylenol in 24 hours **Do not take medications that contain aspirin or ibuprofen

## 2015-11-22 NOTE — MAU Note (Signed)
Onset of abdominal pain and back pain for 2 weeks, ear pain, thinks her last period was towards the end of December.

## 2015-11-22 NOTE — MAU Provider Note (Signed)
History     CSN: [id number]  Arrival date and time: 11/22/15 [id number]   First Provider Initiated Contact with Patient 11/22/15 [id number]         Chief Complaint  Patient presents with  . Abdominal Pain  . Back Pain  . Vaginal Discharge   HPI Comments: Alexandria Fisher is a 25 yo AA female with no significant pmh here today with multiple comlpaints. She states that the lower abdominal and back pain started approx 2 weeks ago. She describes it as a sharp pain that comes and goes. Nothing makes better or worse. She has tried tylenol w/o relief. She rates the pain as 7/10. She endorses nausea, urinary frequency, and malodorous yellowish vaginal discharge. She denies fevers, chills, vomiting, diarrhea, vaginal bleeding. She took a home pregnancy test at the end of January and it was positive. She is sexually active with 1 partner; does not use condoms regularly.   She also complains of HA and cold like symptoms that started approx 2 wks ago and is progressivley improving. She describes her HA as a one sided pain that is throbbing. It is worse at night. Denies worse HA she has ever had. Denies vision changes, photophobia, phonophobia. She endorses rhinorrhea, nasal congestion, sore throat, cough, sob, otalgia. Patient has been taking OTC Tylenol Cold and Flu which has improved her symptoms. Denies any sick contacts. Patient is a current 2.5 pack year smoker.   OB History    Gravida Para Term Preterm AB TAB SAB Ectopic Multiple Living   4 2 2  1  1   2       Past Medical History  Diagnosis Date  . Infection   . Depression   . Chlamydia     Past Surgical History  Procedure Laterality Date  . Dilation and evacuation N/A 09/19/2013    Procedure: DILATATION AND EVACUATION;  Surgeon: Guss Bunde, MD;  Location: [facility];  Service: Gynecology;  Laterality: N/A;    Family History  Problem Relation Age of Onset  . Breast cancer Maternal Grandmother   . Anemia Mother     Social History   Substance Use Topics  . Smoking status: Current Every Day Smoker -- 0.50 packs/day    Types: Cigarettes  . Smokeless tobacco: Former Systems developer  . Alcohol Use: No     Comment: former drinker    Allergies: No Known Allergies  No prescriptions prior to admission    Review of Systems  Constitutional: Negative.  Negative for fever and chills.  HENT: Positive for congestion, ear pain (Right ear fullness) and sore throat. Negative for ear discharge.   Eyes: Negative for blurred vision, double vision and photophobia.  Respiratory: Positive for cough. Negative for sputum production, shortness of breath and wheezing.   Cardiovascular: Negative for chest pain and palpitations.  Gastrointestinal: Positive for nausea and abdominal pain (Lower abdominal pain). Negative for vomiting, diarrhea and constipation.  Genitourinary: Positive for frequency. Negative for dysuria, urgency, hematuria and flank pain.       + vaginal discharge No vaginal bleeding  Neurological: Positive for headaches. Negative for weakness.   Physical Exam   Blood pressure 125/70, pulse 105, temperature 98.8 F (37.1 C), temperature source Oral, resp. rate 18, last menstrual period 10/06/2015, SpO2 100 %.  Physical Exam  Constitutional: She is oriented to person, place, and time. She appears well-developed and well-nourished. No distress.  Patient sitting on the stretcher talking on the phone when I entered the room.  HENT:  Head:  Normocephalic and atraumatic.  Right Ear: Tympanic membrane, external ear and ear canal normal.  Left Ear: Tympanic membrane, external ear and ear canal normal.  Nose: Rhinorrhea present. Right sinus exhibits no maxillary sinus tenderness and no frontal sinus tenderness. Left sinus exhibits no maxillary sinus tenderness and no frontal sinus tenderness.  Mouth/Throat: Uvula is midline. Posterior oropharyngeal erythema present. No oropharyngeal exudate.  Eyes: Pupils are equal, round, and reactive to  light.  Cardiovascular: Normal rate, regular rhythm and normal heart sounds.   Respiratory: Effort normal and breath sounds normal. She has no wheezes. She has no rales.  Diminished breath sound throughout  GI: Soft. Bowel sounds are normal. She exhibits no distension. There is no tenderness. There is no rebound and no guarding.  Genitourinary: Uterus normal. Pelvic exam was performed with patient supine. Cervix exhibits discharge (thick, mucoid off white discharge). Cervix exhibits no motion tenderness and no friability. Right adnexum displays no mass and no tenderness. Left adnexum displays no mass and no tenderness.  Cervix closed  Lymphadenopathy:    She has no cervical adenopathy.  Neurological: She is alert and oriented to person, place, and time.  Skin: Skin is warm and dry.    MAU Course  Procedures Results for orders placed or performed during the hospital encounter of 11/22/15 (from the past 24 hour(s))  Urinalysis, Routine w reflex microscopic (not at [facility])     Status: None   Collection Time: 11/22/15  8:20 AM  Result Value Ref Range   Color, Urine YELLOW YELLOW   APPearance CLEAR CLEAR   Specific Gravity, Urine 1.020 1.005 - 1.030   pH 6.0 5.0 - 8.0   Glucose, UA NEGATIVE NEGATIVE mg/dL   Hgb urine dipstick NEGATIVE NEGATIVE   Bilirubin Urine NEGATIVE NEGATIVE   Ketones, ur NEGATIVE NEGATIVE mg/dL   Protein, ur NEGATIVE NEGATIVE mg/dL   Nitrite NEGATIVE NEGATIVE   Leukocytes, UA NEGATIVE NEGATIVE  Pregnancy, urine POC     Status: Abnormal   Collection Time: 11/22/15  8:23 AM  Result Value Ref Range   Preg Test, Ur POSITIVE (A) NEGATIVE  CBC     Status: Abnormal   Collection Time: 11/22/15  8:41 AM  Result Value Ref Range   WBC 11.6 (H) 4.0 - 10.5 K/uL   RBC 4.31 3.87 - 5.11 MIL/uL   Hemoglobin 12.7 12.0 - 15.0 g/dL   HCT 36.6 36.0 - 46.0 %   MCV 84.9 78.0 - 100.0 fL   MCH 29.5 26.0 - 34.0 pg   MCHC 34.7 30.0 - 36.0 g/dL   RDW 12.2 11.5 - 15.5 %   Platelets 202  150 - 400 K/uL  ABO/Rh     Status: None   Collection Time: 11/22/15  8:41 AM  Result Value Ref Range   ABO/RH(D) B POS   hCG, quantitative, pregnancy     Status: Abnormal   Collection Time: 11/22/15  8:41 AM  Result Value Ref Range   hCG, Beta Chain, Quant, S 13713 (H) <5 mIU/mL  Wet prep, genital     Status: Abnormal   Collection Time: 11/22/15 10:28 AM  Result Value Ref Range   Yeast Wet Prep HPF POC NONE SEEN NONE SEEN   Trich, Wet Prep NONE SEEN NONE SEEN   Clue Cells Wet Prep HPF POC PRESENT (A) NONE SEEN   WBC, Wet Prep HPF POC MODERATE (A) NONE SEEN   Sperm NONE SEEN     US Ob Comp Less 14 Wks  11/22/2015  CLINICAL  DATA:  25 year old pregnant female with pelvic pain for 2 weeks. Quantitative beta HCG of 13,700. Unknown LMP. EXAM: OBSTETRIC <14 WK [place] AND TRANSVAGINAL OB US TECHNIQUE: Both transabdominal and transvaginal ultrasound examinations were performed for complete evaluation of the gestation as well as the maternal uterus, adnexal regions, and pelvic cul-de-sac. Transvaginal technique was performed to assess early pregnancy. COMPARISON:  None. FINDINGS: Intrauterine gestational sac: Visualized/normal in shape. Yolk sac:  Visualized Embryo:  Visualized Cardiac Activity: Visualized Heart Rate: 122  bpm CRL:  6.6  mm   6 w   4 d                  [place] EDC: 07/13/2016 Maternal uterus/adnexae: There is no evidence of subchorionic hemorrhage. The ovaries bilaterally are unremarkable. Two discrete uterine fibroids are identified as follows: A 1.7 x 2 x 1.7 cm intramural right body fibroid. A 2 x 1.5 x 2.3 cm sub serosal posterior body fibroid. IMPRESSION: Single living intrauterine gestation with estimated gestational age of [redacted] weeks 4 days by this ultrasound. No evidence of subchronic hemorrhage. Uterine fibroids as described. Electronically Signed   By: Margarette Canada M.D.   On: 11/22/2015 11:17   US Ob Transvaginal  11/22/2015  CLINICAL DATA:  25 year old pregnant female with pelvic pain for 2  weeks. Quantitative beta HCG of 13,700. Unknown LMP. EXAM: OBSTETRIC <14 WK [place] AND TRANSVAGINAL OB US TECHNIQUE: Both transabdominal and transvaginal ultrasound examinations were performed for complete evaluation of the gestation as well as the maternal uterus, adnexal regions, and pelvic cul-de-sac. Transvaginal technique was performed to assess early pregnancy. COMPARISON:  None. FINDINGS: Intrauterine gestational sac: Visualized/normal in shape. Yolk sac:  Visualized Embryo:  Visualized Cardiac Activity: Visualized Heart Rate: 122  bpm CRL:  6.6  mm   6 w   4 d                  [place] EDC: 07/13/2016 Maternal uterus/adnexae: There is no evidence of subchorionic hemorrhage. The ovaries bilaterally are unremarkable. Two discrete uterine fibroids are identified as follows: A 1.7 x 2 x 1.7 cm intramural right body fibroid. A 2 x 1.5 x 2.3 cm sub serosal posterior body fibroid. IMPRESSION: Single living intrauterine gestation with estimated gestational age of [redacted] weeks 4 days by this ultrasound. No evidence of subchronic hemorrhage. Uterine fibroids as described. Electronically Signed   By: Margarette Canada M.D.   On: 11/22/2015 11:17     MDM Positive UPT B-HCG 1300 U/s ordered  CBC- wnl ABO/Rh- B pos HIV antibody- pending  GC/Chylamidia- pending Wet prep- clue cells present   Ultrasound shows SIUP with cardiac activity; 2 small fibroids   Assessment and Plan  1. Abd pain in pregnancy- Tylenol PRN for pain 2. Normal IUP- patient provided with instructions during pregnancy and a list of providers in the area to establish care 3. Uterine Fibroids- f/u with primary proivder 4. BV- prescribed flagyl 500mg  PO BID  Ocie Cornfield 11/22/2015, 12:08 PM   A:  1. Abdominal pain in pregnancy   2. Normal IUP (intrauterine pregnancy) on prenatal ultrasound, first trimester   3. Uterine fibroids affecting pregnancy in first trimester   4. BV (bacterial vaginosis)     P; Discharge home Rx flagyl Pregnancy  verification letter & provider list given Start taking prenatal vitamins Take OTC meds for symptom management of cold symptoms - list of meds safe in pregnancy given Discussed reasons to return to MAU   I have seen and evaluated  the patient with the NP/PA/Med student. I agree with the assessment and plan as written above. See provider note for full documentation  Jorje Guild, NP  11/22/2015 4:00 PM

## 2015-11-23 LAB — GC/CHLAMYDIA PROBE AMP (~~LOC~~) NOT AT ARMC
Chlamydia: NEGATIVE
Neisseria Gonorrhea: NEGATIVE

## 2015-11-23 LAB — HIV ANTIBODY (ROUTINE TESTING W REFLEX): HIV Screen 4th Generation wRfx: NONREACTIVE

## 2015-12-09 ENCOUNTER — Encounter (HOSPITAL_COMMUNITY): Payer: Self-pay

## 2015-12-09 ENCOUNTER — Inpatient Hospital Stay (HOSPITAL_COMMUNITY)
Admission: AD | Admit: 2015-12-09 | Discharge: 2015-12-09 | Disposition: A | Discharge status: Home / Self Care | Payer: Medicaid Other | Source: Ambulatory Visit | Attending: Family Medicine | Admitting: Family Medicine

## 2015-12-09 DIAGNOSIS — Z3A09 9 weeks gestation of pregnancy: Secondary | ICD-10-CM | POA: Diagnosis not present

## 2015-12-09 DIAGNOSIS — F17210 Nicotine dependence, cigarettes, uncomplicated: Secondary | ICD-10-CM | POA: Diagnosis not present

## 2015-12-09 DIAGNOSIS — R109 Unspecified abdominal pain: Secondary | ICD-10-CM

## 2015-12-09 DIAGNOSIS — R1030 Lower abdominal pain, unspecified: Secondary | ICD-10-CM | POA: Insufficient documentation

## 2015-12-09 DIAGNOSIS — N898 Other specified noninflammatory disorders of vagina: Secondary | ICD-10-CM | POA: Insufficient documentation

## 2015-12-09 DIAGNOSIS — O26891 Other specified pregnancy related conditions, first trimester: Secondary | ICD-10-CM | POA: Diagnosis not present

## 2015-12-09 DIAGNOSIS — I10 Essential (primary) hypertension: Secondary | ICD-10-CM

## 2015-12-09 DIAGNOSIS — O10011 Pre-existing essential hypertension complicating pregnancy, first trimester: Secondary | ICD-10-CM | POA: Insufficient documentation

## 2015-12-09 DIAGNOSIS — O99331 Smoking (tobacco) complicating pregnancy, first trimester: Secondary | ICD-10-CM | POA: Diagnosis not present

## 2015-12-09 DIAGNOSIS — O9989 Other specified diseases and conditions complicating pregnancy, childbirth and the puerperium: Secondary | ICD-10-CM | POA: Diagnosis not present

## 2015-12-09 DIAGNOSIS — O26899 Other specified pregnancy related conditions, unspecified trimester: Secondary | ICD-10-CM

## 2015-12-09 HISTORY — DX: Essential (primary) hypertension: I10

## 2015-12-09 LAB — WET PREP, GENITAL
Clue Cells Wet Prep HPF POC: NONE SEEN
SPERM: NONE SEEN
Trich, Wet Prep: NONE SEEN
YEAST WET PREP: NONE SEEN

## 2015-12-09 LAB — URINE MICROSCOPIC-ADD ON

## 2015-12-09 LAB — URINALYSIS, ROUTINE W REFLEX MICROSCOPIC
Bilirubin Urine: NEGATIVE
Glucose, UA: NEGATIVE mg/dL
Hgb urine dipstick: NEGATIVE
KETONES UR: NEGATIVE mg/dL
NITRITE: NEGATIVE
Protein, ur: NEGATIVE mg/dL
SPECIFIC GRAVITY, URINE: 1.015 (ref 1.005–1.030)
pH: 6 (ref 5.0–8.0)

## 2015-12-09 MED ORDER — PRENATAL VITAMINS 0.8 MG PO TABS: 1.0000 | ORAL_TABLET | Freq: Every day | ORAL | Status: DC

## 2015-12-09 MED ORDER — ASPIRIN EC 81 MG PO TBEC: 81.0000 mg | DELAYED_RELEASE_TABLET | Freq: Every day | ORAL | Status: DC

## 2015-12-09 NOTE — Discharge Instructions (Signed)
Abdominal Pain During Pregnancy Abdominal pain is common in pregnancy. Most of the time, it does not cause harm. There are many causes of abdominal pain. Some causes are more serious than others. Some of the causes of abdominal pain in pregnancy are easily diagnosed. Occasionally, the diagnosis takes time to understand. Other times, the cause is not determined. Abdominal pain can be a sign that something is very wrong with the pregnancy, or the pain may have nothing to do with the pregnancy at all. For this reason, always tell your health care provider if you have any abdominal discomfort. HOME CARE INSTRUCTIONS  Monitor your abdominal pain for any changes. The following actions may help to alleviate any discomfort you are experiencing:  Do not have sexual intercourse or put anything in your vagina until your symptoms go away completely.  Get plenty of rest until your pain improves.  Drink clear fluids if you feel nauseous. Avoid solid food as long as you are uncomfortable or nauseous.  Only take over-the-counter or prescription medicine as directed by your health care provider.  Keep all follow-up appointments with your health care provider. SEEK IMMEDIATE MEDICAL CARE IF:  You are bleeding, leaking fluid, or passing tissue from the vagina.  You have increasing pain or cramping.  You have persistent vomiting.  You have painful or bloody urination.  You have a fever.  You notice a decrease in your baby's movements.  You have extreme weakness or feel faint.  You have shortness of breath, with or without abdominal pain.  You develop a severe headache with abdominal pain.  You have abnormal vaginal discharge with abdominal pain.  You have persistent diarrhea.  You have abdominal pain that continues even after rest, or gets worse. MAKE SURE YOU:   Understand these instructions.  Will watch your condition.  Will get help right away if you are not doing well or get worse.     This information is not intended to replace advice given to you by your health care provider. Make sure you discuss any questions you have with your health care provider.   Document Released: 09/26/2005 Document Revised: 07/17/2013 Document Reviewed: 04/25/2013 Elsevier Interactive Patient Education 2016 [employer]. Hypertension During Pregnancy Hypertension, or high blood pressure, is when there is extra pressure inside your blood vessels that carry blood from the heart to the rest of your body (arteries). It can happen at any time in life, including pregnancy. Hypertension during pregnancy can cause problems for you and your baby. Your baby might not weigh as much as he or she should at birth or might be born early (premature). Very bad cases of hypertension during pregnancy can be life-threatening.  Different types of hypertension can occur during pregnancy. These include:  Chronic hypertension. This happens when a woman has hypertension before pregnancy and it continues during pregnancy.  Gestational hypertension. This is when hypertension develops during pregnancy.  Preeclampsia or toxemia of pregnancy. This is a very serious type of hypertension that develops only during pregnancy. It affects the whole body and can be very dangerous for both mother and baby.  Gestational hypertension and preeclampsia usually go away after your baby is born. Your blood pressure will likely stabilize within 6 weeks. Women who have hypertension during pregnancy have a greater chance of developing hypertension later in life or with future pregnancies. RISK FACTORS There are certain factors that make it more likely for you to develop hypertension during pregnancy. These include:  Having hypertension before pregnancy.  Having hypertension during a previous pregnancy.  Being overweight.  Being older than 40 years.  Being pregnant with more than one baby.  Having diabetes or kidney problems. SIGNS AND  SYMPTOMS Chronic and gestational hypertension rarely cause symptoms. Preeclampsia has symptoms, which may include:  Increased protein in your urine. Your health care provider will check for this at every prenatal visit.  Swelling of your hands and face.  Rapid weight gain.  Headaches.  Visual changes.  Being bothered by light.  Abdominal pain, especially in the upper right area.  Chest pain.  Shortness of breath.  Increased reflexes.  Seizures. These occur with a more severe form of preeclampsia, called eclampsia. DIAGNOSIS  You may be diagnosed with hypertension during a regular prenatal exam. At each prenatal visit, you may have:  Your blood pressure checked.  A urine test to check for protein in your urine. The type of hypertension you are diagnosed with depends on when you developed it. It also depends on your specific blood pressure reading.  Developing hypertension before 20 weeks of pregnancy is consistent with chronic hypertension.  Developing hypertension after 20 weeks of pregnancy is consistent with gestational hypertension.  Hypertension with increased urinary protein is diagnosed as preeclampsia.  Blood pressure measurements that stay above 0000000 systolic or [id number] diastolic are a sign of severe preeclampsia. TREATMENT Treatment for hypertension during pregnancy varies. Treatment depends on the type of hypertension and how serious it is.  If you take medicine for chronic hypertension, you may need to switch medicines.  Medicines called ACE inhibitors should not be taken during pregnancy.  Low-dose aspirin may be suggested for women who have risk factors for preeclampsia.  If you have gestational hypertension, you may need to take a blood pressure medicine that is safe during pregnancy. Your health care provider will recommend the correct medicine.  If you have severe preeclampsia, you may need to be in the hospital. Health care providers will watch you and  your baby very closely. You also may need to take medicine called magnesium sulfate to prevent seizures and lower blood pressure.  Sometimes, an early delivery is needed. This may be the case if the condition worsens. It would be done to protect you and your baby. The only cure for preeclampsia is delivery.  Your health care provider may recommend that you take one low-dose aspirin (81 mg) each day to help prevent high blood pressure during your pregnancy if you are at risk for preeclampsia. You may be at risk for preeclampsia if:  You had preeclampsia or eclampsia during a previous pregnancy.  Your baby did not grow as expected during a previous pregnancy.  You experienced preterm birth with a previous pregnancy.  You experienced a separation of the placenta from the uterus (placental abruption) during a previous pregnancy.  You experienced the loss of your baby during a previous pregnancy.  You are pregnant with more than one baby.  You have other medical conditions, such as diabetes or an autoimmune disease. HOME CARE INSTRUCTIONS  Schedule and keep all of your regular prenatal care appointments. This is important.  Take medicines only as directed by your health care provider. Tell your health care provider about all medicines you take.  Eat as little salt as possible.  Get regular exercise.  Do not drink alcohol.  Do not use tobacco products.  Do not drink products with caffeine.  Lie on your left side when resting. SEEK IMMEDIATE MEDICAL CARE IF:  You have severe  abdominal pain.  You have sudden swelling in your hands, ankles, or face.  You gain 4 pounds (1.8 kg) or more in 1 week.  You vomit repeatedly.  You have vaginal bleeding.  You do not feel your baby moving as much.  You have a headache.  You have blurred or double vision.  You have muscle twitching or spasms.  You have shortness of breath.  You have blue fingernails or lips.  You have blood in  your urine. MAKE SURE YOU:  Understand these instructions.  Will watch your condition.  Will get help right away if you are not doing well or get worse.   This information is not intended to replace advice given to you by your health care provider. Make sure you discuss any questions you have with your health care provider.   Document Released: 06/14/2011 Document Revised: 10/17/2014 Document Reviewed: 04/25/2013 Elsevier Interactive Patient Education [employer].

## 2015-12-09 NOTE — MAU Provider Note (Signed)
MAU HISTORY AND PHYSICAL  Chief Complaint:  Vaginal Discharge and Abdominal [place] is a 25 y.o.  [phone] with IUP at 105w1d presenting for Vaginal Discharge and Abdominal Cramping  Cramps like periods. No bleeding. Present on and off for at least a month. No cramping today. Some discharge. Treated for BV previously this pregnancy. No abdominal pain. Normal diet. No dysuria or hematuria.   Past Medical History  Diagnosis Date  . Infection   . Depression   . Chlamydia   . Chronic hypertension     Past Surgical History  Procedure Laterality Date  . Dilation and evacuation N/A 09/19/2013    Procedure: DILATATION AND EVACUATION;  Surgeon: Guss Bunde, MD;  Location: [facility];  Service: Gynecology;  Laterality: N/A;    Family History  Problem Relation Age of Onset  . Breast cancer Maternal Grandmother   . Anemia Mother     Social History  Substance Use Topics  . Smoking status: Current Every Day Smoker -- 0.50 packs/day    Types: Cigarettes  . Smokeless tobacco: Former Systems developer  . Alcohol Use: No     Comment: former drinker    No Known Allergies  Prescriptions prior to admission  Medication Sig Dispense Refill Last Dose  . metroNIDAZOLE (FLAGYL) 500 MG tablet Take 1 tablet (500 mg total) by mouth 2 (two) times daily. 14 tablet 0     Review of Systems - Negative except for what is mentioned in HPI.  Physical Exam  Blood pressure 133/92, pulse 103, temperature 98.4 F (36.9 C), resp. rate 18, height 5\' 5"  (1.651 m), weight 255 lb (115.667 kg), last menstrual period 10/06/2015. GENERAL: Well-developed, well-nourished female in no acute distress.  LUNGS: Clear to auscultation bilaterally.  HEART: Regular rate and rhythm. ABDOMEN: Soft, nontender, nondistended, gravid.  EXTREMITIES: Nontender, no edema, 2+ distal pulses. GU: normal vagina and cervix, cervix visually closed, scant white mucousy discharge FHT:  175   Labs: Results for orders placed or  performed during the hospital encounter of 12/09/15 (from the past 24 hour(s))  Urinalysis, Routine w reflex microscopic (not at [facility])   Collection Time: 12/09/15  2:25 PM  Result Value Ref Range   Color, Urine YELLOW YELLOW   APPearance CLEAR CLEAR   Specific Gravity, Urine 1.015 1.005 - 1.030   pH 6.0 5.0 - 8.0   Glucose, UA NEGATIVE NEGATIVE mg/dL   Hgb urine dipstick NEGATIVE NEGATIVE   Bilirubin Urine NEGATIVE NEGATIVE   Ketones, ur NEGATIVE NEGATIVE mg/dL   Protein, ur NEGATIVE NEGATIVE mg/dL   Nitrite NEGATIVE NEGATIVE   Leukocytes, UA TRACE (A) NEGATIVE  Urine microscopic-add on   Collection Time: 12/09/15  2:25 PM  Result Value Ref Range   Squamous Epithelial / LPF 0-5 (A) NONE SEEN   WBC, UA 0-5 0 - 5 WBC/hpf   RBC / HPF 0-5 0 - 5 RBC/hpf   Bacteria, UA MANY (A) NONE SEEN  Wet prep, genital   Collection Time: 12/09/15  2:55 PM  Result Value Ref Range   Yeast Wet Prep HPF POC NONE SEEN NONE SEEN   Trich, Wet Prep NONE SEEN NONE SEEN   Clue Cells Wet Prep HPF POC NONE SEEN NONE SEEN   WBC, Wet Prep HPF POC MODERATE (A) NONE SEEN   Sperm NONE SEEN     Imaging Studies:  US Ob Comp Less 14 Wks  11/22/2015  CLINICAL DATA:  25 year old pregnant female with pelvic pain for 2 weeks. Quantitative  beta HCG of 13,700. Unknown LMP. EXAM: OBSTETRIC <14 WK [place] AND TRANSVAGINAL OB US TECHNIQUE: Both transabdominal and transvaginal ultrasound examinations were performed for complete evaluation of the gestation as well as the maternal uterus, adnexal regions, and pelvic cul-de-sac. Transvaginal technique was performed to assess early pregnancy. COMPARISON:  None. FINDINGS: Intrauterine gestational sac: Visualized/normal in shape. Yolk sac:  Visualized Embryo:  Visualized Cardiac Activity: Visualized Heart Rate: 122  bpm CRL:  6.6  mm   6 w   4 d                  [place] EDC: 07/13/2016 Maternal uterus/adnexae: There is no evidence of subchorionic hemorrhage. The ovaries bilaterally are  unremarkable. Two discrete uterine fibroids are identified as follows: A 1.7 x 2 x 1.7 cm intramural right body fibroid. A 2 x 1.5 x 2.3 cm sub serosal posterior body fibroid. IMPRESSION: Single living intrauterine gestation with estimated gestational age of [redacted] weeks 4 days by this ultrasound. No evidence of subchronic hemorrhage. Uterine fibroids as described. Electronically Signed   By: Margarette Canada M.D.   On: 11/22/2015 11:17   US Ob Transvaginal  11/22/2015  CLINICAL DATA:  25 year old pregnant female with pelvic pain for 2 weeks. Quantitative beta HCG of 13,700. Unknown LMP. EXAM: OBSTETRIC <14 WK [place] AND TRANSVAGINAL OB US TECHNIQUE: Both transabdominal and transvaginal ultrasound examinations were performed for complete evaluation of the gestation as well as the maternal uterus, adnexal regions, and pelvic cul-de-sac. Transvaginal technique was performed to assess early pregnancy. COMPARISON:  None. FINDINGS: Intrauterine gestational sac: Visualized/normal in shape. Yolk sac:  Visualized Embryo:  Visualized Cardiac Activity: Visualized Heart Rate: 122  bpm CRL:  6.6  mm   6 w   4 d                  [place] EDC: 07/13/2016 Maternal uterus/adnexae: There is no evidence of subchorionic hemorrhage. The ovaries bilaterally are unremarkable. Two discrete uterine fibroids are identified as follows: A 1.7 x 2 x 1.7 cm intramural right body fibroid. A 2 x 1.5 x 2.3 cm sub serosal posterior body fibroid. IMPRESSION: Single living intrauterine gestation with estimated gestational age of [redacted] weeks 4 days by this ultrasound. No evidence of subchronic hemorrhage. Uterine fibroids as described. Electronically Signed   By: Margarette Canada M.D.   On: 11/22/2015 11:17    Assessment/Plan: Alexandria Fisher is  25 y.o. [id number] at [redacted]w[redacted]d presents with Vaginal Discharge and Abdominal Cramping  # Lower abdominal pain - etiology unclear but mild and present on and off throughout pregnancy, essentially no pain today. Normal exam, no  constitutional signs/symptoms, good diet, no n/v/d, no vaginal bleeding or leakage of fluid, FHTs present, no bleeding, do not think intrauterine. - abdominal pain in pregnancy return precautions - tylenol, heat, warm bath  # Vaginal discharge - likely physiologic as wet prep wnl; G/C is pending  # Chronic hypertension - mild - start aspirin at 14 wks - referring to high risk OB  Patsy Lager Lea Walbert 3/1/20173:36 PM

## 2015-12-09 NOTE — MAU Note (Signed)
Pt presents to MAU with complaints of lower abdominal cramping on and off for a couple of days. Reports a thick yellow vaginal discharge

## 2015-12-10 LAB — GC/CHLAMYDIA PROBE AMP (~~LOC~~) NOT AT ARMC
Chlamydia: POSITIVE — AB
Neisseria Gonorrhea: NEGATIVE

## 2015-12-11 ENCOUNTER — Telehealth: Payer: Self-pay | Admitting: *Deleted

## 2015-12-11 DIAGNOSIS — O98811 Other maternal infectious and parasitic diseases complicating pregnancy, first trimester: Principal | ICD-10-CM

## 2015-12-11 DIAGNOSIS — A749 Chlamydial infection, unspecified: Secondary | ICD-10-CM

## 2015-12-11 MED ORDER — AZITHROMYCIN 500 MG PO TABS: ORAL_TABLET | ORAL | Status: DC

## 2015-12-11 NOTE — Telephone Encounter (Signed)
Telephone call to patient regarding positive chlamydia culture, patient notified.  Rx routed to her pharmacy for treatment.  Instructed patient to notify her partner for treatment and to abstain from sex for 7 days post treatment.  Report of treatment faxed to health department.

## 2015-12-12 ENCOUNTER — Encounter (HOSPITAL_COMMUNITY): Payer: Self-pay | Admitting: *Deleted

## 2015-12-12 ENCOUNTER — Inpatient Hospital Stay (HOSPITAL_COMMUNITY)
Admission: AD | Admit: 2015-12-12 | Discharge: 2015-12-12 | Disposition: A | Discharge status: Home / Self Care | Payer: Medicaid Other | Source: Ambulatory Visit | Attending: Family Medicine | Admitting: Family Medicine

## 2015-12-12 DIAGNOSIS — Z3A09 9 weeks gestation of pregnancy: Secondary | ICD-10-CM | POA: Diagnosis not present

## 2015-12-12 DIAGNOSIS — Z87891 Personal history of nicotine dependence: Secondary | ICD-10-CM | POA: Diagnosis not present

## 2015-12-12 DIAGNOSIS — O26891 Other specified pregnancy related conditions, first trimester: Secondary | ICD-10-CM | POA: Diagnosis not present

## 2015-12-12 DIAGNOSIS — O26899 Other specified pregnancy related conditions, unspecified trimester: Secondary | ICD-10-CM

## 2015-12-12 DIAGNOSIS — Z7982 Long term (current) use of aspirin: Secondary | ICD-10-CM | POA: Diagnosis not present

## 2015-12-12 DIAGNOSIS — N898 Other specified noninflammatory disorders of vagina: Secondary | ICD-10-CM | POA: Diagnosis not present

## 2015-12-12 DIAGNOSIS — R1032 Left lower quadrant pain: Secondary | ICD-10-CM | POA: Insufficient documentation

## 2015-12-12 DIAGNOSIS — R109 Unspecified abdominal pain: Secondary | ICD-10-CM | POA: Diagnosis not present

## 2015-12-12 DIAGNOSIS — R51 Headache: Secondary | ICD-10-CM | POA: Insufficient documentation

## 2015-12-12 LAB — URINE MICROSCOPIC-ADD ON: BACTERIA UA: NONE SEEN

## 2015-12-12 LAB — URINALYSIS, ROUTINE W REFLEX MICROSCOPIC
Bilirubin Urine: NEGATIVE
Glucose, UA: NEGATIVE mg/dL
Ketones, ur: NEGATIVE mg/dL
Leukocytes, UA: NEGATIVE
Nitrite: NEGATIVE
PH: 6 (ref 5.0–8.0)
Protein, ur: NEGATIVE mg/dL
SPECIFIC GRAVITY, URINE: 1.01 (ref 1.005–1.030)

## 2015-12-12 MED ORDER — ACETAMINOPHEN 500 MG PO TABS
1000.0000 mg | ORAL_TABLET | Freq: Once | ORAL | Status: AC
  Administered 2015-12-12: 1000 mg via ORAL
  Filled 2015-12-12: qty 2

## 2015-12-12 NOTE — Discharge Instructions (Signed)

## 2015-12-12 NOTE — MAU Provider Note (Signed)
History     CSN: [id number]  Arrival date and time: 12/12/15 [id number]   First Provider Initiated Contact with Patient 12/12/15 1031      Chief Complaint  Patient presents with  . Vaginal Bleeding  . Abdominal Pain  . Headache   HPI   [name] is a 25 y.o. female [phone] at [redacted]w[redacted]d presenting with Nausea, abdominal pain and brown vaginal spotting. She was called yesterday and told that her chlamydia test was positive. She took the treatment that was recommended yesterday. Since she has taken the medicine she has felt nauseated along with some lower abdominal pain. The pain is located in the Left lower quadrant pain and it comes and goes. She has not taken anything for the pain.   The brown discharge started this morning; she noticed it this morning when she woke up. No recent intercourse; she did have a pelvic exam a few days ago.  Denies active, bright red bleeding.   OB History    Gravida Para Term Preterm AB TAB SAB Ectopic Multiple Living   5 2 2  2  2   2       Past Medical History  Diagnosis Date  . Infection   . Depression   . Chlamydia   . Chronic hypertension     Past Surgical History  Procedure Laterality Date  . Dilation and evacuation N/A 09/19/2013    Procedure: DILATATION AND EVACUATION;  Surgeon: Guss Bunde, MD;  Location: [facility];  Service: Gynecology;  Laterality: N/A;    Family History  Problem Relation Age of Onset  . Breast cancer Maternal Grandmother   . Anemia Mother     Social History  Substance Use Topics  . Smoking status: Former Smoker -- 0.50 packs/day    Types: Cigarettes  . Smokeless tobacco: Former Systems developer  . Alcohol Use: No     Comment: former drinker    Allergies: No Known Allergies  Prescriptions prior to admission  Medication Sig Dispense Refill Last Dose  . aspirin EC 81 MG tablet Take 1 tablet (81 mg total) by mouth daily. Start taking April 1st to prevent preeclampsia 300 tablet 2   . azithromycin (ZITHROMAX) 500  MG tablet Take two tablets by mouth once (Patient not taking: Reported on 12/12/2015) 2 tablet 0 Completed Course at Unknown time  . Prenatal Multivit-Min-Fe-FA (PRENATAL VITAMINS) 0.8 MG tablet Take 1 tablet by mouth daily. 90 tablet 4    Results for orders placed or performed during the hospital encounter of 12/12/15 (from the past 48 hour(s))  Urinalysis, Routine w reflex microscopic (not at [facility])     Status: Abnormal   Collection Time: 12/12/15 10:05 AM  Result Value Ref Range   Color, Urine YELLOW YELLOW   APPearance CLEAR CLEAR   Specific Gravity, Urine 1.010 1.005 - 1.030   pH 6.0 5.0 - 8.0   Glucose, UA NEGATIVE NEGATIVE mg/dL   Hgb urine dipstick TRACE (A) NEGATIVE   Bilirubin Urine NEGATIVE NEGATIVE   Ketones, ur NEGATIVE NEGATIVE mg/dL   Protein, ur NEGATIVE NEGATIVE mg/dL   Nitrite NEGATIVE NEGATIVE   Leukocytes, UA NEGATIVE NEGATIVE  Urine microscopic-add on     Status: Abnormal   Collection Time: 12/12/15 10:05 AM  Result Value Ref Range   Squamous Epithelial / LPF 0-5 (A) NONE SEEN   WBC, UA 0-5 0 - 5 WBC/hpf   RBC / HPF 0-5 0 - 5 RBC/hpf   Bacteria, UA NONE SEEN NONE SEEN  Urine-Other MUCOUS PRESENT     Review of Systems  Constitutional: Negative for fever and chills.  Gastrointestinal: Positive for abdominal pain.  Neurological: Positive for headaches.   Physical Exam   Blood pressure 128/65, pulse 86, temperature 98.2 F (36.8 C), temperature source Oral, resp. rate 18, height 5\' 5"  (1.651 m), weight 255 lb 3.2 oz (115.758 kg), last menstrual period 10/06/2015.  Physical Exam  Constitutional: She is oriented to person, place, and time. She appears well-developed and well-nourished. No distress.  HENT:  Head: Normocephalic.  Eyes: Pupils are equal, round, and reactive to light.  Neck: Neck supple.  Genitourinary:  Speculum exam: Vagina - Small amount of thick brown discharge, no odor Cervix - No contact bleeding Bimanual exam: Cervix closed Chaperone  present for exam.  Musculoskeletal: Normal range of motion.  Neurological: She is alert and oriented to person, place, and time.  Skin: Skin is warm. She is not diaphoretic.  Psychiatric: Her behavior is normal.    MAU Course  Procedures  None  MDM B positive blood type  Traci Sermon CNM performed bedside ultrasound; fetal heart rates observed in the 170's Tylenol 1 gram given PO   Assessment and Plan   A:  1. Vaginal discharge during pregnancy in first trimester   2. Abdominal pain in pregnancy    P:  Discharge home in stable condition Ok to take tylenol as directed on the bottle Pelvic rest Condoms always Return to MAU if symptoms worsen   Lezlie Lye, NP 12/12/2015 1:09 PM

## 2015-12-12 NOTE — MAU Note (Signed)
Patient was told she has Chlamydia started on medication, having cramping, brownish vaginal discharge, woke up this morning with a headache.

## 2015-12-13 ENCOUNTER — Inpatient Hospital Stay (HOSPITAL_COMMUNITY)
Admission: AD | Admit: 2015-12-13 | Discharge: 2015-12-13 | Disposition: A | Discharge status: Home / Self Care | Payer: Medicaid Other | Source: Ambulatory Visit | Attending: Obstetrics & Gynecology | Admitting: Obstetrics & Gynecology

## 2015-12-13 DIAGNOSIS — O26891 Other specified pregnancy related conditions, first trimester: Secondary | ICD-10-CM | POA: Insufficient documentation

## 2015-12-13 DIAGNOSIS — Z0000 Encounter for general adult medical examination without abnormal findings: Secondary | ICD-10-CM

## 2015-12-13 DIAGNOSIS — N898 Other specified noninflammatory disorders of vagina: Secondary | ICD-10-CM | POA: Diagnosis not present

## 2015-12-13 DIAGNOSIS — Z3A09 9 weeks gestation of pregnancy: Secondary | ICD-10-CM | POA: Diagnosis not present

## 2015-12-13 DIAGNOSIS — Z87891 Personal history of nicotine dependence: Secondary | ICD-10-CM | POA: Insufficient documentation

## 2015-12-13 DIAGNOSIS — O9989 Other specified diseases and conditions complicating pregnancy, childbirth and the puerperium: Secondary | ICD-10-CM

## 2015-12-13 DIAGNOSIS — Z7982 Long term (current) use of aspirin: Secondary | ICD-10-CM | POA: Insufficient documentation

## 2015-12-13 NOTE — MAU Provider Note (Signed)
History     CSN: [id number]  Arrival date and time: 12/13/15 1658  Seen by provider at 1730     Chief Complaint  Patient presents with  . Vaginal Discharge   HPI Alexandria Fisher 24 y.o. [redacted]w[redacted]d  Client was here yesterday and then is back today as she saw chunks of vaginal discharge this morning which was more brown in color.  Was concerned.  Does not have any itching or burning.  Was treated for chlamydia 4 days ago.  Informed her partner he needs to be treated but they have broken up at this point.  She has had a miscarriage before and is worried this might be a miscarriage again.  Heard the baby's heartbeat yesterday with the ultrasound machine and wants to hear the baby's heartbeat again today.  FHT yesterday was 170.  OB History    Gravida Para Term Preterm AB TAB SAB Ectopic Multiple Living   5 2 2  2  2   2       Past Medical History  Diagnosis Date  . Infection   . Depression   . Chlamydia   . Chronic hypertension     Past Surgical History  Procedure Laterality Date  . Dilation and evacuation N/A 09/19/2013    Procedure: DILATATION AND EVACUATION;  Surgeon: Guss Bunde, MD;  Location: [facility];  Service: Gynecology;  Laterality: N/A;    Family History  Problem Relation Age of Onset  . Breast cancer Maternal Grandmother   . Anemia Mother     Social History  Substance Use Topics  . Smoking status: Former Smoker -- 0.50 packs/day    Types: Cigarettes  . Smokeless tobacco: Former Systems developer  . Alcohol Use: No     Comment: former drinker    Allergies: No Known Allergies  Prescriptions prior to admission  Medication Sig Dispense Refill Last Dose  . aspirin EC 81 MG tablet Take 1 tablet (81 mg total) by mouth daily. Start taking April 1st to prevent preeclampsia 300 tablet 2   . Prenatal Multivit-Min-Fe-FA (PRENATAL VITAMINS) 0.8 MG tablet Take 1 tablet by mouth daily. 90 tablet 4     Review of Systems  Constitutional: Negative for fever.  Gastrointestinal:  Negative for nausea and vomiting.       Periodic mild abdominal pain - none now.  Genitourinary:       Vaginal discharge. No vaginal bleeding. No dysuria.   Physical Exam   Blood pressure 130/67, pulse 98, temperature 98.5 F (36.9 C), temperature source Oral, last menstrual period 10/06/2015.  Physical Exam  Nursing note and vitals reviewed. Constitutional: She is oriented to person, place, and time. She appears well-developed and well-nourished.  obese  HENT:  Head: Normocephalic.  Eyes: EOM are normal.  Neck: Neck supple.  GI: Soft. There is no tenderness.  Genitourinary:  Speculum exam: Vagina - Some erythema noted just inside the introitus, Very small amount of discharge - tan in color, no odor Cervix - No contact bleeding Bimanual exam: Cervix closed Uterus non tender Adnexa non tender, no masses bilaterally Chaperone present for exam.  Musculoskeletal: Normal range of motion.  Neurological: She is alert and oriented to person, place, and time.  Skin: Skin is warm and dry.  Psychiatric: She has a normal mood and affect.    MAU Course  Procedures  MDM Labs from yesterday reviewed.  With this speculum exam, there is no clinical evidence for a yeast infection however, given that the client has recently  had antibiotics, it would not be unexpected to develop a yeast infection.  Advised client to get over the counter Monistat and use vaginally if the discharge is bothersome to her.  Advised that we heard the baby's heart beat yesterday and that listening to the heartbeat daily is not a possibility.  Will not listen today - provider is not approved for ultrasound use.  Assessment and Plan  Normal vaginal discharge  Plan Keep your appointment to begin prenatal care in the [facility] clinics on March 23. Use monistat vaginal cream if desired if discharge is worsening. Return if you have severe lower abdominal pain or vaginal bleeding.   BURLESON,TERRI 12/13/2015, 5:31 PM

## 2015-12-13 NOTE — MAU Note (Addendum)
Vaginal discharge got worse, this morning thick and coming out in chunks, was told by triage nurse she should come back to be re-evaluated, was seen in MAU yesterday for same complaint.

## 2015-12-13 NOTE — Discharge Instructions (Signed)
Keep your appointment to begin prenatal care in the [facility] clinics on March 23.  Use monistat vaginal cream if desired if discharge is worsening.  Return if you have severe lower abdominal pain or vaginal bleeding.

## 2015-12-31 ENCOUNTER — Ambulatory Visit (INDEPENDENT_AMBULATORY_CARE_PROVIDER_SITE_OTHER): Payer: Medicaid Other | Admitting: Obstetrics & Gynecology

## 2015-12-31 ENCOUNTER — Encounter: Payer: Self-pay | Admitting: Obstetrics & Gynecology

## 2015-12-31 VITALS — BP 128/74 | HR 96 | Temp 98.2°F | Wt 254.8 lb

## 2015-12-31 DIAGNOSIS — O10919 Unspecified pre-existing hypertension complicating pregnancy, unspecified trimester: Secondary | ICD-10-CM | POA: Insufficient documentation

## 2015-12-31 DIAGNOSIS — O26891 Other specified pregnancy related conditions, first trimester: Secondary | ICD-10-CM

## 2015-12-31 DIAGNOSIS — Z113 Encounter for screening for infections with a predominantly sexual mode of transmission: Secondary | ICD-10-CM

## 2015-12-31 DIAGNOSIS — O98311 Other infections with a predominantly sexual mode of transmission complicating pregnancy, first trimester: Secondary | ICD-10-CM | POA: Diagnosis not present

## 2015-12-31 DIAGNOSIS — A749 Chlamydial infection, unspecified: Secondary | ICD-10-CM | POA: Diagnosis not present

## 2015-12-31 DIAGNOSIS — N898 Other specified noninflammatory disorders of vagina: Secondary | ICD-10-CM | POA: Diagnosis not present

## 2015-12-31 DIAGNOSIS — O10911 Unspecified pre-existing hypertension complicating pregnancy, first trimester: Secondary | ICD-10-CM | POA: Diagnosis not present

## 2015-12-31 DIAGNOSIS — O0991 Supervision of high risk pregnancy, unspecified, first trimester: Secondary | ICD-10-CM

## 2015-12-31 DIAGNOSIS — O0990 Supervision of high risk pregnancy, unspecified, unspecified trimester: Secondary | ICD-10-CM | POA: Insufficient documentation

## 2015-12-31 DIAGNOSIS — A499 Bacterial infection, unspecified: Secondary | ICD-10-CM

## 2015-12-31 DIAGNOSIS — Z124 Encounter for screening for malignant neoplasm of cervix: Secondary | ICD-10-CM

## 2015-12-31 DIAGNOSIS — B9689 Other specified bacterial agents as the cause of diseases classified elsewhere: Secondary | ICD-10-CM

## 2015-12-31 DIAGNOSIS — N760 Acute vaginitis: Secondary | ICD-10-CM

## 2015-12-31 DIAGNOSIS — O98811 Other maternal infectious and parasitic diseases complicating pregnancy, first trimester: Secondary | ICD-10-CM

## 2015-12-31 LAB — POCT URINALYSIS DIP (DEVICE)
BILIRUBIN URINE: NEGATIVE
Glucose, UA: NEGATIVE mg/dL
HGB URINE DIPSTICK: NEGATIVE
KETONES UR: NEGATIVE mg/dL
LEUKOCYTES UA: NEGATIVE
NITRITE: NEGATIVE
PH: 6.5 (ref 5.0–8.0)
Protein, ur: NEGATIVE mg/dL
SPECIFIC GRAVITY, URINE: 1.02 (ref 1.005–1.030)
Urobilinogen, UA: 0.2 mg/dL (ref 0.0–1.0)

## 2015-12-31 LAB — COMPREHENSIVE METABOLIC PANEL
ALBUMIN: 3.3 g/dL — AB (ref 3.6–5.1)
ALT: 15 U/L (ref 6–29)
AST: 13 U/L (ref 10–30)
Alkaline Phosphatase: 41 U/L (ref 33–115)
BUN: 7 mg/dL (ref 7–25)
CALCIUM: 9.2 mg/dL (ref 8.6–10.2)
CHLORIDE: 102 mmol/L (ref 98–110)
CO2: 24 mmol/L (ref 20–31)
CREATININE: 0.7 mg/dL (ref 0.50–1.10)
Glucose, Bld: 79 mg/dL (ref 65–99)
POTASSIUM: 4 mmol/L (ref 3.5–5.3)
SODIUM: 137 mmol/L (ref 135–146)
Total Bilirubin: 0.3 mg/dL (ref 0.2–1.2)
Total Protein: 6.7 g/dL (ref 6.1–8.1)

## 2015-12-31 MED ORDER — AZITHROMYCIN 250 MG PO TABS: 1000.0000 mg | ORAL_TABLET | Freq: Once | ORAL | Status: DC

## 2015-12-31 MED ORDER — ASPIRIN EC 81 MG PO TBEC: 81.0000 mg | DELAYED_RELEASE_TABLET | Freq: Every day | ORAL | Status: DC

## 2015-12-31 NOTE — Progress Notes (Signed)
Nutrition note: 1st visit consult Pt has h/o obesity & HTN. Pt has gained 4.8# @ [redacted]w[redacted]d, which is slightly > expected. Pt reports eating 3 meals & 3 snacks/d. Pt is taking a PNV. Pt reports having some N&V and heartburn. Pt received verbal & written education on general nutrition during pregnancy. Discussed tips to decrease N&V and heartburn. Reviewed importance of watching her sodium intake from added salts & processed foods. Discussed benefits & importance of BF. Discussed wt gain goals of 11-20# or 0.5#/wk. Pt agrees to continue taking a PNV. Pt does not have [place] but plans to call soon. Pt is unsure about BF. F/u as needed Vladimir Faster, MS, RD, LDN, [facility]

## 2015-12-31 NOTE — Patient Instructions (Signed)

## 2015-12-31 NOTE — Progress Notes (Signed)
Subjective:    Alexandria Fisher is a 25 y.o. [phone] at [redacted]w[redacted]d by LMP c/w 6 week scan being seen today for her first obstetrical visit.  Her obstetrical history is significant for chronic hypertension and chlamydia that was diagnosed on 12/09/15 and treated.  Patient does not intend to breast feed. Pregnancy history fully reviewed.  Patient reports yellow vaginal discharge, and feels she was re-infected by her partner who she is unsure got treatment.  She desires re-treatment.   Filed Vitals:   12/31/15 0858  BP: 128/74  Pulse: 96  Temp: 98.2 F (36.8 C)  Weight: 254 lb 12.8 oz (115.577 kg)    HISTORY: OB History  Gravida Para Term Preterm AB SAB TAB Ectopic Multiple Living  5 2 2  2 2    2     # Outcome Date GA Lbr Len/2nd Weight Sex Delivery Anes PTL Lv  5 Current           4 Term 07/14/11 [redacted]w[redacted]d 06:00 / 00:07 6 lb 14 oz (3.118 kg) F Vag-Spont None  Y  3 SAB  [redacted]w[redacted]d         2 SAB           1 Term              Past Medical History  Diagnosis Date  . Infection   . Depression   . Chlamydia   . Chronic hypertension   . Hx MRSA infection 07/14/2011   Past Surgical History  Procedure Laterality Date  . Dilation and evacuation N/A 09/19/2013    Procedure: DILATATION AND EVACUATION;  Surgeon: Guss Bunde, MD;  Location: [facility];  Service: Gynecology;  Laterality: N/A;   Family History  Problem Relation Age of Onset  . Breast cancer Maternal Grandmother   . Anemia Mother      Exam    Uterus:     Pelvic Exam:    Perineum: No Hemorrhoids, Normal Perineum   Vulva: normal   Vagina:  normal mucosa, yellow-brown discharge   Cervix: cervical motion tenderness, multiparous appearance, scant bleeding following Pap and no cervical motion tenderness   Adnexa: normal adnexa and no mass, fullness, tenderness   Bony Pelvis: average  System: Breast:  normal appearance, no masses or tenderness   Skin: normal coloration and turgor, no rashes   Neurologic: normal, negative, normal  mood   Extremities: normal strength, tone, and muscle mass, ROM of all joints is normal   HEENT PERRLA, extra ocular movement intact and sclera clear, anicteric   Mouth/Teeth mucous membranes moist, pharynx normal without lesions and dental hygiene good   Neck supple and no masses   Cardiovascular: regular rate and rhythm   Respiratory:  appears well, vitals normal, no respiratory distress, acyanotic, normal RR, chest clear, no wheezing, crepitations, rhonchi, normal symmetric air entry   Abdomen: soft, non-tender; bowel sounds normal; no masses,  no organomegaly   Urinary: urethral meatus normal      Assessment:    Pregnancy: [id number] Patient Active Problem List   Diagnosis Date Noted  . Chlamydia infection affecting pregnancy in first trimester 12/31/2015  . Chronic hypertension complicating pregnancy, antepartum 12/31/2015  . Supervision of high-risk pregnancy 12/31/2015  . Obesity in pregnancy, antepartum 10/24/2013  . Hypertension 10/24/2013     Plan:   Initial labs including CHTN labs and 1 hr GTT drawn.  Chlamydia culture also resent; Azithromycin 1000 mg po x 1 given due to reported exposure to untreated partner. Wet  prep also done, will follow up results and manage accordingly. Continue Prenatal vitamins. Aspirin prescribed. Problem list reviewed and updated. Genetic Screening discussed First Screen and Integrated Screen: ordered. Ultrasound discussed; fetal survey: to be ordered later. The nature of [facility] with multiple MDs and other Advanced Practice Providers was explained to patient; also emphasized that residents, students are part of our team. Routine obstetric precautions reviewed. Follow up in 4 weeks.  Osborne Oman , MD  12/31/2015

## 2015-12-31 NOTE — Progress Notes (Signed)
Early glucola given due to BMI >30 Vaginal discharge yellowish with odor  New ob packet given  Flu vaccine declined  First Trimester Screen scheduled for March 30th @ 1500.  Pt notified.

## 2016-01-01 ENCOUNTER — Telehealth: Payer: Self-pay

## 2016-01-01 ENCOUNTER — Telehealth: Payer: Self-pay | Admitting: Family Medicine

## 2016-01-01 LAB — PRESCRIPTION MONITORING PROFILE (19 PANEL)
Amphetamine/Meth: NEGATIVE ng/mL
BARBITURATE SCREEN, URINE: NEGATIVE ng/mL
BENZODIAZEPINE SCREEN, URINE: NEGATIVE ng/mL
Buprenorphine, Urine: NEGATIVE ng/mL
CANNABINOID SCRN UR: NEGATIVE ng/mL
CARISOPRODOL, URINE: NEGATIVE ng/mL
COCAINE METABOLITES: NEGATIVE ng/mL
CREATININE, URINE: 157.76 mg/dL (ref >20.0)
ECSTASY: NEGATIVE ng/mL
FENTANYL URINE: NEGATIVE ng/mL
METHADONE SCREEN, URINE: NEGATIVE ng/mL
Meperidine, Ur: NEGATIVE ng/mL
Methaqualone: NEGATIVE ng/mL
Nitrites, Initial: NEGATIVE ug/mL
OPIATE SCREEN, URINE: NEGATIVE ng/mL
OXYCODONE SCRN UR: NEGATIVE ng/mL
PH URINE, INITIAL: 5.9 pH (ref 4.5–8.9)
PHENCYCLIDINE, UR: NEGATIVE ng/mL
Propoxyphene: NEGATIVE ng/mL
Tapentadol, urine: NEGATIVE ng/mL
Tramadol Scrn, Ur: NEGATIVE ng/mL
Zolpidem, Urine: NEGATIVE ng/mL

## 2016-01-01 LAB — PRENATAL PROFILE (SOLSTAS)
Antibody Screen: NEGATIVE
BASOS PCT: 0 % (ref 0–1)
Basophils Absolute: 0 10*3/uL (ref 0.0–0.1)
EOS PCT: 0 % (ref 0–5)
Eosinophils Absolute: 0 10*3/uL (ref 0.0–0.7)
HEMATOCRIT: 38.2 % (ref 36.0–46.0)
HIV: NONREACTIVE
Hemoglobin: 12.9 g/dL (ref 12.0–15.0)
Hepatitis B Surface Ag: NEGATIVE
LYMPHS PCT: 13 % (ref 12–46)
Lymphs Abs: 1.5 10*3/uL (ref 0.7–4.0)
MCH: 29.3 pg (ref 26.0–34.0)
MCHC: 33.8 g/dL (ref 30.0–36.0)
MCV: 86.8 fL (ref 78.0–100.0)
MONO ABS: 0.6 10*3/uL (ref 0.1–1.0)
MPV: 10.7 fL (ref 8.6–12.4)
Monocytes Relative: 5 % (ref 3–12)
Neutro Abs: 9.8 10*3/uL — ABNORMAL HIGH (ref 1.7–7.7)
Neutrophils Relative %: 82 % — ABNORMAL HIGH (ref 43–77)
Platelets: 209 10*3/uL (ref 150–400)
RBC: 4.4 MIL/uL (ref 3.87–5.11)
RDW: 13.1 % (ref 11.5–15.5)
RH TYPE: POSITIVE
Rubella: 2.88 Index — ABNORMAL HIGH (ref <0.90)
WBC: 11.9 10*3/uL — AB (ref 4.0–10.5)

## 2016-01-01 LAB — GC/CHLAMYDIA PROBE AMP (~~LOC~~) NOT AT ARMC
Chlamydia: NEGATIVE
NEISSERIA GONORRHEA: NEGATIVE

## 2016-01-01 LAB — WET PREP, GENITAL
Trich, Wet Prep: NONE SEEN
YEAST WET PREP: NONE SEEN

## 2016-01-01 LAB — CYTOLOGY - PAP

## 2016-01-01 LAB — PROTEIN / CREATININE RATIO, URINE
Creatinine, Urine: 157 mg/dL (ref 20–320)
Protein Creatinine Ratio: 89 mg/g creat (ref 21–161)
Total Protein, Urine: 14 mg/dL (ref 5–24)

## 2016-01-01 MED ORDER — METRONIDAZOLE 500 MG PO TABS: 500.0000 mg | ORAL_TABLET | Freq: Two times a day (BID) | ORAL | Status: DC

## 2016-01-01 NOTE — Telephone Encounter (Signed)
Entered in error

## 2016-01-01 NOTE — Telephone Encounter (Signed)
Patient requesting nurse call back, has questions.

## 2016-01-01 NOTE — Addendum Note (Signed)
Addended by: Verita Schneiders A on: 01/01/2016 08:25 AM   Modules accepted: Orders

## 2016-01-02 LAB — CULTURE, OB URINE: Colony Count: 1000

## 2016-01-04 ENCOUNTER — Encounter: Payer: Self-pay | Admitting: Obstetrics & Gynecology

## 2016-01-04 DIAGNOSIS — D573 Sickle-cell trait: Secondary | ICD-10-CM

## 2016-01-04 HISTORY — DX: Sickle-cell trait: D57.3

## 2016-01-04 LAB — HEMOGLOBINOPATHY EVALUATION
HGB A2 QUANT: 3.4 % — AB (ref 2.2–3.2)
HGB F QUANT: 0 % (ref 0.0–2.0)
Hemoglobin Other: 0 %
Hgb A: 56.1 % — ABNORMAL LOW (ref 96.8–97.8)
Hgb S Quant: 40.5 % — ABNORMAL HIGH

## 2016-01-05 ENCOUNTER — Telehealth: Payer: Self-pay

## 2016-01-05 NOTE — Telephone Encounter (Signed)
Pt has been informed of BV and will go and pick up rx from the pharmacy

## 2016-01-07 ENCOUNTER — Ambulatory Visit (HOSPITAL_COMMUNITY)
Admission: RE | Admit: 2016-01-07 | Discharge: 2016-01-07 | Disposition: A | Discharge status: Home / Self Care | Payer: Medicaid Other | Source: Ambulatory Visit | Attending: Obstetrics & Gynecology | Admitting: Obstetrics & Gynecology

## 2016-01-07 ENCOUNTER — Encounter (HOSPITAL_COMMUNITY): Payer: Self-pay

## 2016-01-07 VITALS — BP 116/72 | HR 92 | Wt 256.4 lb

## 2016-01-07 DIAGNOSIS — Z36 Encounter for antenatal screening of mother: Secondary | ICD-10-CM | POA: Diagnosis not present

## 2016-01-07 DIAGNOSIS — Z3A13 13 weeks gestation of pregnancy: Secondary | ICD-10-CM | POA: Diagnosis not present

## 2016-01-07 DIAGNOSIS — D573 Sickle-cell trait: Secondary | ICD-10-CM

## 2016-01-07 DIAGNOSIS — O10011 Pre-existing essential hypertension complicating pregnancy, first trimester: Secondary | ICD-10-CM | POA: Diagnosis not present

## 2016-01-07 DIAGNOSIS — O99211 Obesity complicating pregnancy, first trimester: Secondary | ICD-10-CM | POA: Insufficient documentation

## 2016-01-07 DIAGNOSIS — O0991 Supervision of high risk pregnancy, unspecified, first trimester: Secondary | ICD-10-CM

## 2016-01-07 DIAGNOSIS — O10911 Unspecified pre-existing hypertension complicating pregnancy, first trimester: Secondary | ICD-10-CM

## 2016-01-14 ENCOUNTER — Other Ambulatory Visit (HOSPITAL_COMMUNITY): Payer: Self-pay

## 2016-01-28 ENCOUNTER — Ambulatory Visit (INDEPENDENT_AMBULATORY_CARE_PROVIDER_SITE_OTHER): Payer: Medicaid Other | Admitting: Family

## 2016-01-28 VITALS — BP 125/81 | HR 100 | Temp 98.5°F | Wt 252.3 lb

## 2016-01-28 DIAGNOSIS — Z1389 Encounter for screening for other disorder: Secondary | ICD-10-CM

## 2016-01-28 DIAGNOSIS — O26892 Other specified pregnancy related conditions, second trimester: Secondary | ICD-10-CM

## 2016-01-28 DIAGNOSIS — N898 Other specified noninflammatory disorders of vagina: Secondary | ICD-10-CM | POA: Diagnosis not present

## 2016-01-28 DIAGNOSIS — O10912 Unspecified pre-existing hypertension complicating pregnancy, second trimester: Secondary | ICD-10-CM | POA: Diagnosis not present

## 2016-01-28 DIAGNOSIS — O0992 Supervision of high risk pregnancy, unspecified, second trimester: Secondary | ICD-10-CM

## 2016-01-28 LAB — POCT URINALYSIS DIP (DEVICE)
Bilirubin Urine: NEGATIVE
Glucose, UA: NEGATIVE mg/dL
Ketones, ur: NEGATIVE mg/dL
NITRITE: NEGATIVE
PROTEIN: NEGATIVE mg/dL
Specific Gravity, Urine: 1.015 (ref 1.005–1.030)
UROBILINOGEN UA: 0.2 mg/dL (ref 0.0–1.0)
pH: 6.5 (ref 5.0–8.0)

## 2016-01-28 NOTE — Progress Notes (Signed)
U/S scheduled for 05/02 @ 315pm

## 2016-01-28 NOTE — Progress Notes (Signed)
Subjective:  Alexandria Fisher is a 25 y.o. [phone] at [redacted]w[redacted]d being seen today for ongoing prenatal care.  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  Patient reports increased vaginal discharge, slight odor.   .  .  Movement: Present. Denies leaking of fluid.   The following portions of the patient's history were reviewed and updated as appropriate: allergies, current medications, past family history, past medical history, past social history, past surgical history and problem list. Problem list updated.  Objective:   Filed Vitals:   01/28/16 0925  BP: 125/81  Pulse: 100  Temp: 98.5 F (36.9 C)  Weight: 252 lb 4.8 oz (114.443 kg)    Fetal Status: Fetal Heart Rate (bpm): 147 Fundal Height: 21 cm Movement: Present     General:  Alert, oriented and cooperative. Patient is in no acute distress.  Skin: Skin is warm and dry. No rash noted.   Cardiovascular: Normal heart rate noted  Respiratory: Normal respiratory effort, no problems with respiration noted  Abdomen: Soft, gravid, appropriate for gestational age. Pain/Pressure: Present     Pelvic:   Vag D/C Character: Yellow   Cervical exam deferred        Extremities: Normal range of motion.  Edema: None  Mental Status: Normal mood and affect. Normal behavior. Normal judgment and thought content.   Urinalysis:     Protein neg Glucose neg  Assessment and Plan:  Pregnancy: [id number] at [redacted]w[redacted]d  1. Supervision of high-risk pregnancy, second trimester - Scheduled anatomy ultrasound  2. Vaginal discharge in pregnancy in second trimester - Wet prep, genital  3. Encounter for routine screening for malformation using ultrasonics - [place] MFM OB COMP +  14 WK; Future  4. Preexisting hypertension complicating pregnancy, antepartum, second trimester - Continue close monitoring  General obstetric precautions including but not limited to vaginal bleeding and pelvic pain reviewed in detail with the patient. Please refer to After Visit Summary for other counseling recommendations.  Return in about 3 weeks (around 02/18/2016).   Venia Carbon Michiel Cowboy, CNM

## 2016-01-29 ENCOUNTER — Telehealth: Payer: Self-pay | Admitting: Family

## 2016-01-29 LAB — WET PREP, GENITAL
TRICH WET PREP: NONE SEEN
WBC, Wet Prep HPF POC: NONE SEEN
Yeast Wet Prep HPF POC: NONE SEEN

## 2016-01-29 MED ORDER — METRONIDAZOLE 500 MG PO TABS: 500.0000 mg | ORAL_TABLET | Freq: Two times a day (BID) | ORAL | Status: DC

## 2016-01-29 NOTE — Telephone Encounter (Signed)
Pt notified regarding clue cells and RX sent to pharmacy.

## 2016-02-09 ENCOUNTER — Other Ambulatory Visit: Payer: Self-pay | Admitting: Family

## 2016-02-09 ENCOUNTER — Ambulatory Visit (HOSPITAL_COMMUNITY)
Admission: RE | Admit: 2016-02-09 | Discharge: 2016-02-09 | Disposition: A | Discharge status: Home / Self Care | Payer: Medicaid Other | Source: Ambulatory Visit | Attending: Family | Admitting: Family

## 2016-02-09 ENCOUNTER — Encounter (HOSPITAL_COMMUNITY): Payer: Self-pay

## 2016-02-09 VITALS — BP 119/72 | HR 93 | Wt 249.4 lb

## 2016-02-09 DIAGNOSIS — Z36 Encounter for antenatal screening of mother: Secondary | ICD-10-CM | POA: Diagnosis present

## 2016-02-09 DIAGNOSIS — Z1389 Encounter for screening for other disorder: Secondary | ICD-10-CM

## 2016-02-09 DIAGNOSIS — Z3A18 18 weeks gestation of pregnancy: Secondary | ICD-10-CM

## 2016-02-09 DIAGNOSIS — O99212 Obesity complicating pregnancy, second trimester: Secondary | ICD-10-CM

## 2016-02-09 DIAGNOSIS — E669 Obesity, unspecified: Secondary | ICD-10-CM | POA: Insufficient documentation

## 2016-02-09 DIAGNOSIS — O10919 Unspecified pre-existing hypertension complicating pregnancy, unspecified trimester: Secondary | ICD-10-CM

## 2016-02-09 DIAGNOSIS — O10012 Pre-existing essential hypertension complicating pregnancy, second trimester: Secondary | ICD-10-CM | POA: Diagnosis not present

## 2016-02-09 DIAGNOSIS — Z363 Encounter for antenatal screening for malformations: Secondary | ICD-10-CM

## 2016-02-09 DIAGNOSIS — O0992 Supervision of high risk pregnancy, unspecified, second trimester: Secondary | ICD-10-CM

## 2016-02-18 ENCOUNTER — Encounter: Payer: Self-pay | Admitting: Obstetrics & Gynecology

## 2016-02-18 ENCOUNTER — Ambulatory Visit (INDEPENDENT_AMBULATORY_CARE_PROVIDER_SITE_OTHER): Payer: Medicaid Other | Admitting: Obstetrics & Gynecology

## 2016-02-18 VITALS — BP 123/79 | HR 98 | Wt 244.5 lb

## 2016-02-18 DIAGNOSIS — E669 Obesity, unspecified: Secondary | ICD-10-CM | POA: Diagnosis not present

## 2016-02-18 DIAGNOSIS — O10912 Unspecified pre-existing hypertension complicating pregnancy, second trimester: Secondary | ICD-10-CM

## 2016-02-18 DIAGNOSIS — O99212 Obesity complicating pregnancy, second trimester: Secondary | ICD-10-CM | POA: Diagnosis present

## 2016-02-18 DIAGNOSIS — O0992 Supervision of high risk pregnancy, unspecified, second trimester: Secondary | ICD-10-CM

## 2016-02-18 LAB — POCT URINALYSIS DIP (DEVICE)
GLUCOSE, UA: NEGATIVE mg/dL
Hgb urine dipstick: NEGATIVE
Ketones, ur: 15 mg/dL — AB
NITRITE: NEGATIVE
Protein, ur: 30 mg/dL — AB
Specific Gravity, Urine: 1.02 (ref 1.005–1.030)
UROBILINOGEN UA: 1 mg/dL (ref 0.0–1.0)
pH: 6 (ref 5.0–8.0)

## 2016-02-18 MED ORDER — FAMOTIDINE 40 MG PO TABS: 40.0000 mg | ORAL_TABLET | Freq: Every day | ORAL | Status: DC

## 2016-02-18 NOTE — Progress Notes (Signed)
Subjective:  Alexandria Fisher is a 25 y.o. [phone] at [redacted]w[redacted]d being seen today for ongoing prenatal care.  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  Patient reports vomiting.  Contractions: Not present. Vag. Bleeding: None.  Movement: Present. Denies leaking of fluid.   The following portions of the patient's history were reviewed and updated as appropriate: allergies, current medications, past family history, past medical history, past social history, past surgical history and problem list. Problem list updated.  Objective:   Filed Vitals:   02/18/16 1056  BP: 123/79  Pulse: 98  Weight: 244 lb 8 oz (110.904 kg)    Fetal Status: Fetal Heart Rate (bpm): 154   Movement: Present     General:  Alert, oriented and cooperative. Patient is in no acute distress.  Skin: Skin is warm and dry. No rash noted.   Cardiovascular: Normal heart rate noted  Respiratory: Normal respiratory effort, no problems with respiration noted  Abdomen: Soft, gravid, appropriate for gestational age. Pain/Pressure: Present     Pelvic: Vag. Bleeding: None     Cervical exam deferred        Extremities: Normal range of motion.  Edema: None  Mental Status: Normal mood and affect. Normal behavior. Normal judgment and thought content.   Urinalysis: Urine Protein: 1+ Urine Glucose: Negative  Assessment and Plan:  Pregnancy: [id number] at [redacted]w[redacted]d  1. Supervision of high-risk pregnancy, second trimester Patient complains of vomiting following most meals. She mentions possible blood in her vomit recently, but is unsure. She has not tried any medication to reduce nausea or vomiting, and mentions that the vomiting is typically  preceded by a sensation of reflux. Will prescribe her phenergan. She has a history of chronic hypertension and was put on baby aspirin a few months prior; her pressures look fine this morning, though she has protein in her urine.   Preterm labor symptoms and general obstetric precautions including but not limited to vaginal bleeding, contractions, leaking of fluid and fetal movement were reviewed in detail with the patient. Please refer to After Visit Summary for other counseling recommendations.  No Follow-up on file.   Glendale Chard, Med Student

## 2016-02-18 NOTE — Progress Notes (Signed)
Pt reports not being able to keep any food down would like something prescribed

## 2016-02-22 ENCOUNTER — Encounter: Payer: Self-pay | Admitting: *Deleted

## 2016-03-15 ENCOUNTER — Inpatient Hospital Stay (HOSPITAL_COMMUNITY)
Admission: AD | Admit: 2016-03-15 | Discharge: 2016-03-16 | Disposition: A | Discharge status: Home / Self Care | Payer: Medicaid Other | Source: Ambulatory Visit | Attending: Obstetrics and Gynecology | Admitting: Obstetrics and Gynecology

## 2016-03-15 DIAGNOSIS — F329 Major depressive disorder, single episode, unspecified: Secondary | ICD-10-CM | POA: Insufficient documentation

## 2016-03-15 DIAGNOSIS — R109 Unspecified abdominal pain: Secondary | ICD-10-CM

## 2016-03-15 DIAGNOSIS — D573 Sickle-cell trait: Secondary | ICD-10-CM | POA: Insufficient documentation

## 2016-03-15 DIAGNOSIS — Z87891 Personal history of nicotine dependence: Secondary | ICD-10-CM | POA: Insufficient documentation

## 2016-03-15 DIAGNOSIS — O26892 Other specified pregnancy related conditions, second trimester: Secondary | ICD-10-CM | POA: Insufficient documentation

## 2016-03-15 DIAGNOSIS — Z8614 Personal history of Methicillin resistant Staphylococcus aureus infection: Secondary | ICD-10-CM | POA: Insufficient documentation

## 2016-03-15 DIAGNOSIS — O26899 Other specified pregnancy related conditions, unspecified trimester: Secondary | ICD-10-CM

## 2016-03-15 DIAGNOSIS — O99342 Other mental disorders complicating pregnancy, second trimester: Secondary | ICD-10-CM | POA: Insufficient documentation

## 2016-03-15 DIAGNOSIS — O10912 Unspecified pre-existing hypertension complicating pregnancy, second trimester: Secondary | ICD-10-CM | POA: Insufficient documentation

## 2016-03-15 DIAGNOSIS — Z3A23 23 weeks gestation of pregnancy: Secondary | ICD-10-CM | POA: Insufficient documentation

## 2016-03-15 DIAGNOSIS — R1010 Upper abdominal pain, unspecified: Secondary | ICD-10-CM | POA: Insufficient documentation

## 2016-03-15 NOTE — MAU Note (Signed)
Pt reports pain in her upper and lower abd off/on for 24 hours.

## 2016-03-16 ENCOUNTER — Encounter (HOSPITAL_COMMUNITY): Payer: Self-pay

## 2016-03-16 DIAGNOSIS — O9989 Other specified diseases and conditions complicating pregnancy, childbirth and the puerperium: Secondary | ICD-10-CM

## 2016-03-16 DIAGNOSIS — Z87891 Personal history of nicotine dependence: Secondary | ICD-10-CM | POA: Diagnosis not present

## 2016-03-16 DIAGNOSIS — R109 Unspecified abdominal pain: Secondary | ICD-10-CM | POA: Diagnosis not present

## 2016-03-16 DIAGNOSIS — Z3A23 23 weeks gestation of pregnancy: Secondary | ICD-10-CM | POA: Diagnosis not present

## 2016-03-16 DIAGNOSIS — D573 Sickle-cell trait: Secondary | ICD-10-CM | POA: Diagnosis not present

## 2016-03-16 DIAGNOSIS — R1010 Upper abdominal pain, unspecified: Secondary | ICD-10-CM | POA: Diagnosis not present

## 2016-03-16 DIAGNOSIS — F329 Major depressive disorder, single episode, unspecified: Secondary | ICD-10-CM | POA: Diagnosis not present

## 2016-03-16 DIAGNOSIS — O26892 Other specified pregnancy related conditions, second trimester: Secondary | ICD-10-CM | POA: Diagnosis not present

## 2016-03-16 DIAGNOSIS — Z8614 Personal history of Methicillin resistant Staphylococcus aureus infection: Secondary | ICD-10-CM | POA: Diagnosis not present

## 2016-03-16 DIAGNOSIS — O10912 Unspecified pre-existing hypertension complicating pregnancy, second trimester: Secondary | ICD-10-CM | POA: Diagnosis not present

## 2016-03-16 DIAGNOSIS — O99342 Other mental disorders complicating pregnancy, second trimester: Secondary | ICD-10-CM | POA: Diagnosis not present

## 2016-03-16 LAB — COMPREHENSIVE METABOLIC PANEL
ALBUMIN: 3.2 g/dL — AB (ref 3.5–5.0)
ALT: 15 U/L (ref 14–54)
ANION GAP: 7 (ref 5–15)
AST: 18 U/L (ref 15–41)
Alkaline Phosphatase: 40 U/L (ref 38–126)
BUN: 6 mg/dL (ref 6–20)
CHLORIDE: 104 mmol/L (ref 101–111)
CO2: 24 mmol/L (ref 22–32)
Calcium: 9.3 mg/dL (ref 8.9–10.3)
Creatinine, Ser: 0.6 mg/dL (ref 0.44–1.00)
GFR calc Af Amer: >60 mL/min (ref >60)
GFR calc non Af Amer: >60 mL/min (ref >60)
GLUCOSE: 104 mg/dL — AB (ref 65–99)
POTASSIUM: 3.6 mmol/L (ref 3.5–5.1)
SODIUM: 135 mmol/L (ref 135–145)
TOTAL PROTEIN: 7 g/dL (ref 6.5–8.1)
Total Bilirubin: 0.6 mg/dL (ref 0.3–1.2)

## 2016-03-16 LAB — URINALYSIS, ROUTINE W REFLEX MICROSCOPIC
Bilirubin Urine: NEGATIVE
GLUCOSE, UA: NEGATIVE mg/dL
Hgb urine dipstick: NEGATIVE
Ketones, ur: 15 mg/dL — AB
Nitrite: NEGATIVE
PH: 5.5 (ref 5.0–8.0)
PROTEIN: NEGATIVE mg/dL
Specific Gravity, Urine: 1.015 (ref 1.005–1.030)

## 2016-03-16 LAB — CBC WITH DIFFERENTIAL/PLATELET
BASOS PCT: 0 %
Basophils Absolute: 0 10*3/uL (ref 0.0–0.1)
EOS ABS: 0.1 10*3/uL (ref 0.0–0.7)
Eosinophils Relative: 1 %
HCT: 33.4 % — ABNORMAL LOW (ref 36.0–46.0)
Hemoglobin: 11.8 g/dL — ABNORMAL LOW (ref 12.0–15.0)
Lymphocytes Relative: 15 %
Lymphs Abs: 1.9 10*3/uL (ref 0.7–4.0)
MCH: 30.4 pg (ref 26.0–34.0)
MCHC: 35.3 g/dL (ref 30.0–36.0)
MCV: 86.1 fL (ref 78.0–100.0)
MONO ABS: 0.6 10*3/uL (ref 0.1–1.0)
Monocytes Relative: 5 %
Neutro Abs: 9.8 10*3/uL — ABNORMAL HIGH (ref 1.7–7.7)
Neutrophils Relative %: 79 %
PLATELETS: 161 10*3/uL (ref 150–400)
RBC: 3.88 MIL/uL (ref 3.87–5.11)
RDW: 12.6 % (ref 11.5–15.5)
WBC: 12.3 10*3/uL — ABNORMAL HIGH (ref 4.0–10.5)

## 2016-03-16 LAB — URINE MICROSCOPIC-ADD ON

## 2016-03-16 LAB — LIPASE, BLOOD: Lipase: 20 U/L (ref 11–51)

## 2016-03-16 MED ORDER — GI COCKTAIL ~~LOC~~
30.0000 mL | Freq: Once | ORAL | Status: AC
  Administered 2016-03-16: 30 mL via ORAL
  Filled 2016-03-16: qty 30

## 2016-03-16 NOTE — MAU Provider Note (Signed)
MAU HISTORY AND PHYSICAL  Chief Complaint:  Abdominal pain  Alexandria Fisher is a 25 y.o.  [phone] with IUP at [redacted]w[redacted]d presenting for abdominal pain.  Began yesterday. First time this pregnancy. Sometimes constant, other times comes and goes. Worse with movement. No vomiting or diarrhea. No fevers. Worse with movement. No lof, no vaginal bleeding.   Denies reflux.  Past Medical History  Diagnosis Date  . Infection   . Depression   . Chlamydia   . Chronic hypertension   . Hx MRSA infection 07/14/2011  . 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 Dilation and evacuation N/A 09/19/2013    Procedure: DILATATION AND EVACUATION;  Surgeon: Guss Bunde, MD;  Location: [facility];  Service: Gynecology;  Laterality: N/A;    Family History  Problem Relation Age of Onset  . Breast cancer Maternal Grandmother   . Anemia Mother     Social History  Substance Use Topics  . Smoking status: Former Smoker -- 0.50 packs/day    Types: Cigarettes  . Smokeless tobacco: Former Systems developer  . Alcohol Use: No     Comment: former drinker    No Known Allergies  No prescriptions prior to admission    Review of Systems - Negative except for what is mentioned in HPI.  Physical Exam  Blood pressure 139/76, pulse 87, temperature 98.2 F (36.8 C), temperature source Oral, resp. rate 18, height 5\' 6"  (1.676 m), weight 242 lb (109.77 kg), last menstrual period 10/06/2015, SpO2 100 %. GENERAL: Well-developed, well-nourished female in no acute distress.  LUNGS: Clear to auscultation bilaterally.  HEART: Regular rate and rhythm. ABDOMEN: Soft, nontender, nondistended, gravid.  EXTREMITIES: Nontender, no edema, 2+ distal pulses. Cervical Exam: deferred FHT:  150s Contractions: none   Labs: Results for orders placed or performed during the hospital encounter of 03/15/16 (from the past 24 hour(s))  Urinalysis, Routine w reflex microscopic (not at [facility])    Collection Time: 03/16/16 12:01 AM  Result Value Ref Range   Color, Urine YELLOW YELLOW   APPearance CLEAR CLEAR   Specific Gravity, Urine 1.015 1.005 - 1.030   pH 5.5 5.0 - 8.0   Glucose, UA NEGATIVE NEGATIVE mg/dL   Hgb urine dipstick NEGATIVE NEGATIVE   Bilirubin Urine NEGATIVE NEGATIVE   Ketones, ur 15 (A) NEGATIVE mg/dL   Protein, ur NEGATIVE NEGATIVE mg/dL   Nitrite NEGATIVE NEGATIVE   Leukocytes, UA SMALL (A) NEGATIVE  Urine microscopic-add on   Collection Time: 03/16/16 12:01 AM  Result Value Ref Range   Squamous Epithelial / LPF 0-5 (A) NONE SEEN   WBC, UA 0-5 0 - 5 WBC/hpf   RBC / HPF 0-5 0 - 5 RBC/hpf   Bacteria, UA FEW (A) NONE SEEN  CBC with Differential/Platelet   Collection Time: 03/16/16  1:34 AM  Result Value Ref Range   WBC 12.3 (H) 4.0 - 10.5 K/uL   RBC 3.88 3.87 - 5.11 MIL/uL   Hemoglobin 11.8 (L) 12.0 - 15.0 g/dL   HCT 33.4 (L) 36.0 - 46.0 %   MCV 86.1 78.0 - 100.0 fL   MCH 30.4 26.0 - 34.0 pg   MCHC 35.3 30.0 - 36.0 g/dL   RDW 12.6 11.5 - 15.5 %   Platelets 161 150 - 400 K/uL   Neutrophils Relative % 79 %   Neutro Abs 9.8 (H) 1.7 - 7.7 K/uL   Lymphocytes Relative 15 %   Lymphs Abs 1.9 0.7 -  4.0 K/uL   Monocytes Relative 5 %   Monocytes Absolute 0.6 0.1 - 1.0 K/uL   Eosinophils Relative 1 %   Eosinophils Absolute 0.1 0.0 - 0.7 K/uL   Basophils Relative 0 %   Basophils Absolute 0.0 0.0 - 0.1 K/uL  Comprehensive metabolic panel   Collection Time: 03/16/16  1:34 AM  Result Value Ref Range   Sodium 135 135 - 145 mmol/L   Potassium 3.6 3.5 - 5.1 mmol/L   Chloride 104 101 - 111 mmol/L   CO2 24 22 - 32 mmol/L   Glucose, Bld 104 (H) 65 - 99 mg/dL   BUN 6 6 - 20 mg/dL   Creatinine, Ser 0.60 0.44 - 1.00 mg/dL   Calcium 9.3 8.9 - 10.3 mg/dL   Total Protein 7.0 6.5 - 8.1 g/dL   Albumin 3.2 (L) 3.5 - 5.0 g/dL   AST 18 15 - 41 U/L   ALT 15 14 - 54 U/L   Alkaline Phosphatase 40 38 - 126 U/L   Total Bilirubin 0.6 0.3 - 1.2 mg/dL   GFR calc non Af  Amer >60 >60 mL/min   GFR calc Af Amer >60 >60 mL/min   Anion gap 7 5 - 15  Lipase, blood   Collection Time: 03/16/16  1:34 AM  Result Value Ref Range   Lipase 20 11 - 51 U/L    Imaging Studies:  No results found.  Assessment: Alexandria Fisher is  26 y.o. [phone] at [redacted]w[redacted]d presents with intermittent upper abdominal pain. Here very well appearing, normal vitals, normal exam, no pain at time of exam. No associated signs/symptoms. CMP, lipase, and cbc unremarkable. No contractions or vaginal bleeding. Some improvement with gi cocktail - gerd may cause/contribute. Normal fetal heart tones (150s)  Plan: - resume famotidine; tums prn - abdominal pain, abruption, ptl return precautions  Patsy Lager Shalisa Mcquade 6/7/20172:42 AM

## 2016-03-16 NOTE — Discharge Instructions (Signed)

## 2016-03-16 NOTE — MAU Note (Signed)
Notified provider that patient is here with c/o abdominal pain. Provider said he would be down to see patient.

## 2016-03-17 ENCOUNTER — Encounter: Payer: Self-pay | Admitting: Obstetrics and Gynecology

## 2016-03-17 ENCOUNTER — Ambulatory Visit (INDEPENDENT_AMBULATORY_CARE_PROVIDER_SITE_OTHER): Payer: Medicaid Other | Admitting: Obstetrics and Gynecology

## 2016-03-17 VITALS — BP 110/68 | HR 83 | Wt 241.0 lb

## 2016-03-17 DIAGNOSIS — O10912 Unspecified pre-existing hypertension complicating pregnancy, second trimester: Secondary | ICD-10-CM

## 2016-03-17 DIAGNOSIS — E669 Obesity, unspecified: Secondary | ICD-10-CM | POA: Diagnosis not present

## 2016-03-17 DIAGNOSIS — O99212 Obesity complicating pregnancy, second trimester: Secondary | ICD-10-CM

## 2016-03-17 DIAGNOSIS — I10 Essential (primary) hypertension: Secondary | ICD-10-CM

## 2016-03-17 DIAGNOSIS — O0992 Supervision of high risk pregnancy, unspecified, second trimester: Secondary | ICD-10-CM

## 2016-03-17 LAB — POCT URINALYSIS DIP (DEVICE)
Bilirubin Urine: NEGATIVE
GLUCOSE, UA: NEGATIVE mg/dL
Hgb urine dipstick: NEGATIVE
KETONES UR: NEGATIVE mg/dL
Nitrite: NEGATIVE
Protein, ur: NEGATIVE mg/dL
SPECIFIC GRAVITY, URINE: 1.015 (ref 1.005–1.030)
UROBILINOGEN UA: 1 mg/dL (ref 0.0–1.0)
pH: 6 (ref 5.0–8.0)

## 2016-03-17 MED ORDER — FAMOTIDINE 20 MG PO TABS: 20.0000 mg | ORAL_TABLET | Freq: Two times a day (BID) | ORAL | Status: DC

## 2016-03-17 NOTE — Progress Notes (Signed)
Subjective:  Alexandria Fisher is a 25 y.o. [phone] at 107w1d being seen today for ongoing prenatal care.  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  Patient reports epigastric pain. Vomitting has improved but she has not been able to obtain Rx pepcid.   .  .  Movement: Present. Denies leaking of fluid.   The following portions of the patient's history were reviewed and updated as appropriate: allergies, current medications, past family history, past medical history, past social history, past surgical history and problem list. Problem list updated.  Objective:   Filed Vitals:   03/17/16 1057  BP: 110/68  Pulse: 83  Weight: 241 lb (109.317 kg)    Fetal Status: Fetal Heart Rate (bpm): 138 Fundal Height: 25 cm Movement: Present     General:  Alert, oriented and cooperative. Patient is in no acute distress.  Skin: Skin is warm and dry. No rash noted.   Cardiovascular: Normal heart rate noted  Respiratory: Normal respiratory effort, no problems with respiration noted  Abdomen: Soft, gravid, appropriate for gestational age. Pain/Pressure: Present     Pelvic:       Cervical exam deferred        Extremities: Normal range of motion.     Mental Status: Normal mood and affect. Normal behavior. Normal judgment and thought content.   Urinalysis: Urine Protein: Negative Urine Glucose: Negative  Assessment and Plan:  Pregnancy: [id number] at [redacted]w[redacted]d  1. Preexisting hypertension complicating pregnancy, antepartum, second trimester BP well controlled without medication Continue daily ASA  2. Supervision of high-risk pregnancy, second trimester Rx pepcid provided Congratulated the patient on her weight  during this pregnancy Third trimester labs next visit  3. Essential hypertension   4. Obesity in pregnancy, antepartum, second trimester   Preterm labor symptoms and general obstetric precautions including but not limited to vaginal bleeding, contractions, leaking of fluid and fetal movement were reviewed in detail with the patient. Please refer to After Visit Summary for other counseling recommendations.  Return in about 4 weeks (around 04/14/2016).   Mora Bellman, MD

## 2016-04-14 ENCOUNTER — Ambulatory Visit (INDEPENDENT_AMBULATORY_CARE_PROVIDER_SITE_OTHER): Payer: Medicaid Other | Admitting: Obstetrics & Gynecology

## 2016-04-14 ENCOUNTER — Encounter: Payer: Self-pay | Admitting: Obstetrics & Gynecology

## 2016-04-14 VITALS — BP 125/75 | HR 91 | Temp 97.6°F | Wt 242.3 lb

## 2016-04-14 DIAGNOSIS — G44209 Tension-type headache, unspecified, not intractable: Secondary | ICD-10-CM

## 2016-04-14 DIAGNOSIS — O0992 Supervision of high risk pregnancy, unspecified, second trimester: Secondary | ICD-10-CM

## 2016-04-14 DIAGNOSIS — E669 Obesity, unspecified: Secondary | ICD-10-CM | POA: Diagnosis not present

## 2016-04-14 DIAGNOSIS — O99212 Obesity complicating pregnancy, second trimester: Secondary | ICD-10-CM

## 2016-04-14 DIAGNOSIS — O10912 Unspecified pre-existing hypertension complicating pregnancy, second trimester: Secondary | ICD-10-CM | POA: Diagnosis not present

## 2016-04-14 DIAGNOSIS — O9989 Other specified diseases and conditions complicating pregnancy, childbirth and the puerperium: Secondary | ICD-10-CM

## 2016-04-14 LAB — CBC
HEMATOCRIT: 33.3 % — AB (ref 35.0–45.0)
Hemoglobin: 11.2 g/dL — ABNORMAL LOW (ref 11.7–15.5)
MCH: 29.8 pg (ref 27.0–33.0)
MCHC: 33.6 g/dL (ref 32.0–36.0)
MCV: 88.6 fL (ref 80.0–100.0)
MPV: 11.3 fL (ref 7.5–12.5)
PLATELETS: 177 10*3/uL (ref 140–400)
RBC: 3.76 MIL/uL — ABNORMAL LOW (ref 3.80–5.10)
RDW: 13.2 % (ref 11.0–15.0)
WBC: 10.8 10*3/uL (ref 3.8–10.8)

## 2016-04-14 LAB — POCT URINALYSIS DIP (DEVICE)
BILIRUBIN URINE: NEGATIVE
GLUCOSE, UA: NEGATIVE mg/dL
Ketones, ur: NEGATIVE mg/dL
Nitrite: NEGATIVE
Protein, ur: NEGATIVE mg/dL
SPECIFIC GRAVITY, URINE: 1.015 (ref 1.005–1.030)
UROBILINOGEN UA: 0.2 mg/dL (ref 0.0–1.0)
pH: 7 (ref 5.0–8.0)

## 2016-04-14 MED ORDER — BUTALBITAL-APAP-CAFFEINE 50-500-40 MG PO TABS: 1.0000 | ORAL_TABLET | ORAL | Status: DC | PRN

## 2016-04-14 NOTE — Patient Instructions (Addendum)

## 2016-04-14 NOTE — Progress Notes (Signed)
C/o pain since last night on right side abdomen and radiates to back, sometimes just in back. States is getting worse. Right now =8. C/o headaches=7 for past few days- taking tylenol without relief. Urinalysis shows trace hemoglobin, large leukocytes.  Declines tdap today, given information and discussed with her. Will let us know at later appointment if she changes her mind.

## 2016-04-14 NOTE — Progress Notes (Signed)
Subjective:  Alexandria Fisher is a 25 y.o. [phone] at [redacted]w[redacted]d being seen today for ongoing prenatal care.  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  Patient reports headache and occasional contractions.  Contractions: Irregular. Vag. Bleeding: None.  Movement: Present. Denies leaking of fluid.   The following portions of the patient's history were reviewed and updated as appropriate: allergies, current medications, past family history, past medical history, past social history, past surgical history and problem list. Problem list updated.  Objective:   Filed Vitals:   04/14/16 1533  BP: 125/75  Pulse: 91  Temp: 97.6 F (36.4 C)  Weight: 242 lb 4.8 oz (109.907 kg)    Fetal Status: Fetal Heart Rate (bpm): 152   Movement: Present     General:  Alert, oriented and cooperative. Patient is in no acute distress.  Skin: Skin is warm and dry. No rash noted.   Cardiovascular: Normal heart rate noted  Respiratory: Normal respiratory effort, no problems with respiration noted  Abdomen: Soft, gravid, appropriate for gestational age. Pain/Pressure: Present     Pelvic:  Cervical exam deferred        Extremities: Normal range of motion.  Edema: None  Mental Status: Normal mood and affect. Normal behavior. Normal judgment and thought content.   Urinalysis: Urine Protein: Negative Urine Glucose: Negative  Assessment and Plan:  Pregnancy: [id number] at [redacted]w[redacted]d  1. Supervision of high risk pregnancy, antepartum, second trimester Nl BP, tension headache right side, benign exam - Glucose Tolerance, 1 HR (50g) w/o Fasting - HIV antibody (with reflex) - RPR - CBC - POCT urinalysis dip (device) - calcium carbonate (TUMS - DOSED IN MG ELEMENTAL CALCIUM) 500 MG  chewable tablet; Chew 1 tablet by mouth as needed for indigestion or heartburn.  2. Tension-type headache, not intractable, unspecified chronicity pattern Fioricet  Preterm labor symptoms and general obstetric precautions including but not limited to vaginal bleeding, contractions, leaking of fluid and fetal movement were reviewed in detail with the patient. Please refer to After Visit Summary for other counseling recommendations.  Return in about 2 weeks (around 04/28/2016).   Woodroe Mode, MD

## 2016-04-15 LAB — GLUCOSE TOLERANCE, 1 HOUR (50G) W/O FASTING: GLUCOSE, 1 HR, GESTATIONAL: 86 mg/dL (ref <140)

## 2016-04-15 LAB — HIV ANTIBODY (ROUTINE TESTING W REFLEX): HIV: NONREACTIVE

## 2016-04-16 LAB — RPR

## 2016-04-26 ENCOUNTER — Other Ambulatory Visit: Payer: Self-pay | Admitting: Obstetrics & Gynecology

## 2016-04-26 ENCOUNTER — Encounter (HOSPITAL_COMMUNITY): Payer: Self-pay

## 2016-04-26 ENCOUNTER — Ambulatory Visit (HOSPITAL_COMMUNITY)
Admission: RE | Admit: 2016-04-26 | Discharge: 2016-04-26 | Disposition: A | Discharge status: Home / Self Care | Payer: Medicaid Other | Source: Ambulatory Visit | Attending: Obstetrics & Gynecology | Admitting: Obstetrics & Gynecology

## 2016-04-26 DIAGNOSIS — O10013 Pre-existing essential hypertension complicating pregnancy, third trimester: Secondary | ICD-10-CM | POA: Diagnosis not present

## 2016-04-26 DIAGNOSIS — O3413 Maternal care for benign tumor of corpus uteri, third trimester: Secondary | ICD-10-CM | POA: Insufficient documentation

## 2016-04-26 DIAGNOSIS — O163 Unspecified maternal hypertension, third trimester: Secondary | ICD-10-CM

## 2016-04-26 DIAGNOSIS — D259 Leiomyoma of uterus, unspecified: Secondary | ICD-10-CM | POA: Insufficient documentation

## 2016-04-26 DIAGNOSIS — Z3A29 29 weeks gestation of pregnancy: Secondary | ICD-10-CM | POA: Diagnosis not present

## 2016-04-26 DIAGNOSIS — O0992 Supervision of high risk pregnancy, unspecified, second trimester: Secondary | ICD-10-CM

## 2016-04-26 DIAGNOSIS — O99213 Obesity complicating pregnancy, third trimester: Secondary | ICD-10-CM | POA: Diagnosis not present

## 2016-04-28 ENCOUNTER — Encounter: Payer: Medicaid Other | Admitting: Student

## 2016-05-24 ENCOUNTER — Ambulatory Visit (HOSPITAL_COMMUNITY): Admission: RE | Admit: 2016-05-24 | Payer: Medicaid Other | Source: Ambulatory Visit

## 2016-05-24 ENCOUNTER — Encounter (HOSPITAL_COMMUNITY): Payer: Self-pay

## 2016-05-31 ENCOUNTER — Encounter: Payer: Medicaid Other | Admitting: Medical

## 2016-06-01 ENCOUNTER — Other Ambulatory Visit (HOSPITAL_COMMUNITY): Payer: Self-pay | Admitting: Maternal and Fetal Medicine

## 2016-06-01 ENCOUNTER — Ambulatory Visit (INDEPENDENT_AMBULATORY_CARE_PROVIDER_SITE_OTHER): Payer: Medicaid Other | Admitting: Obstetrics and Gynecology

## 2016-06-01 ENCOUNTER — Ambulatory Visit (HOSPITAL_COMMUNITY)
Admission: RE | Admit: 2016-06-01 | Discharge: 2016-06-01 | Disposition: A | Discharge status: Home / Self Care | Payer: Medicaid Other | Source: Ambulatory Visit | Attending: Obstetrics & Gynecology | Admitting: Obstetrics & Gynecology

## 2016-06-01 ENCOUNTER — Encounter (HOSPITAL_COMMUNITY): Payer: Self-pay

## 2016-06-01 VITALS — BP 137/79 | HR 98 | Wt 242.0 lb

## 2016-06-01 DIAGNOSIS — I10 Essential (primary) hypertension: Secondary | ICD-10-CM

## 2016-06-01 DIAGNOSIS — Z3A34 34 weeks gestation of pregnancy: Secondary | ICD-10-CM | POA: Diagnosis not present

## 2016-06-01 DIAGNOSIS — D259 Leiomyoma of uterus, unspecified: Secondary | ICD-10-CM | POA: Diagnosis not present

## 2016-06-01 DIAGNOSIS — O99213 Obesity complicating pregnancy, third trimester: Secondary | ICD-10-CM | POA: Diagnosis not present

## 2016-06-01 DIAGNOSIS — D573 Sickle-cell trait: Secondary | ICD-10-CM

## 2016-06-01 DIAGNOSIS — O3413 Maternal care for benign tumor of corpus uteri, third trimester: Secondary | ICD-10-CM | POA: Diagnosis not present

## 2016-06-01 DIAGNOSIS — O163 Unspecified maternal hypertension, third trimester: Secondary | ICD-10-CM | POA: Diagnosis not present

## 2016-06-01 DIAGNOSIS — O99013 Anemia complicating pregnancy, third trimester: Secondary | ICD-10-CM

## 2016-06-01 DIAGNOSIS — E669 Obesity, unspecified: Secondary | ICD-10-CM | POA: Diagnosis not present

## 2016-06-01 DIAGNOSIS — O0993 Supervision of high risk pregnancy, unspecified, third trimester: Secondary | ICD-10-CM

## 2016-06-01 DIAGNOSIS — Z23 Encounter for immunization: Secondary | ICD-10-CM

## 2016-06-01 LAB — POCT URINALYSIS DIP (DEVICE)
BILIRUBIN URINE: NEGATIVE
GLUCOSE, UA: NEGATIVE mg/dL
KETONES UR: NEGATIVE mg/dL
Nitrite: NEGATIVE
PROTEIN: 30 mg/dL — AB
Specific Gravity, Urine: 1.015 (ref 1.005–1.030)
Urobilinogen, UA: 1 mg/dL (ref 0.0–1.0)
pH: 6.5 (ref 5.0–8.0)

## 2016-06-01 MED ORDER — TETANUS-DIPHTH-ACELL PERTUSSIS 5-2.5-18.5 LF-MCG/0.5 IM SUSP
0.5000 mL | Freq: Once | INTRAMUSCULAR | Status: AC
  Administered 2016-06-01: 0.5 mL via INTRAMUSCULAR

## 2016-06-01 NOTE — Progress Notes (Signed)
Patient reports recent increase in headaches & reports occasionally seeing spots/blurry vision

## 2016-06-01 NOTE — Progress Notes (Signed)
Subjective:  Alexandria Fisher is a 25 y.o. [phone] at [redacted]w[redacted]d being seen today for ongoing prenatal care.  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  Patient reports no complaints.  Contractions: Not present. Vag. Bleeding: None.  Movement: Present. Denies leaking of fluid.   The following portions of the patient's history were reviewed and updated as appropriate: allergies, current medications, past family history, past medical history, past social history, past surgical history and problem list. Problem list updated.  Objective:   Vitals:   06/01/16 1314  BP: 137/79  Pulse: 98  Weight: 242 lb (109.8 kg)    Fetal Status: Fetal Heart Rate (bpm): 132   Movement: Present     General:  Alert, oriented and cooperative. Patient is in no acute distress.  Skin: Skin is warm and dry. No rash noted.   Cardiovascular: Normal heart rate noted  Respiratory: Normal respiratory effort, no problems with respiration noted  Abdomen: Soft, gravid, appropriate for gestational age. Pain/Pressure: Present     Pelvic:  Cervical exam deferred        Extremities: Normal range of motion.  Edema: None  Mental Status: Normal mood and affect. Normal behavior. Normal judgment and thought content.   Urinalysis: Urine Protein: 1+ Urine Glucose: Negative  Assessment and Plan:  Pregnancy: [id number] at [redacted]w[redacted]d  1. Hypertension in pregnancy, antepartum, third trimester No S/Sx of PEC today. Pt has missed appts due to work. Importance of keeping OB appts stressed to pt Will also start antenatal testing U/S for growth today - Tdap (BOOSTRIX) injection 0.5 mL; Inject 0.5 mLs into the muscle once.  2. Essential hypertension  3. Supervision of high-risk pregnancy, third trimester  4. 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 Please refer to After Visit Summary for other counseling recommendations.  Return in about 1 week (around 06/08/2016) for OB visit.   Chancy Milroy, MD

## 2016-06-07 ENCOUNTER — Ambulatory Visit (INDEPENDENT_AMBULATORY_CARE_PROVIDER_SITE_OTHER): Payer: Medicaid Other | Admitting: *Deleted

## 2016-06-07 VITALS — BP 101/69 | HR 91

## 2016-06-07 DIAGNOSIS — O163 Unspecified maternal hypertension, third trimester: Secondary | ICD-10-CM

## 2016-06-09 ENCOUNTER — Ambulatory Visit (INDEPENDENT_AMBULATORY_CARE_PROVIDER_SITE_OTHER): Payer: Medicaid Other | Admitting: Family Medicine

## 2016-06-09 VITALS — BP 121/70 | HR 83 | Wt 240.7 lb

## 2016-06-09 DIAGNOSIS — O163 Unspecified maternal hypertension, third trimester: Secondary | ICD-10-CM

## 2016-06-09 DIAGNOSIS — O0993 Supervision of high risk pregnancy, unspecified, third trimester: Secondary | ICD-10-CM

## 2016-06-09 LAB — POCT URINALYSIS DIP (DEVICE)
Bilirubin Urine: NEGATIVE
Glucose, UA: NEGATIVE mg/dL
Ketones, ur: NEGATIVE mg/dL
Nitrite: NEGATIVE
Protein, ur: 30 mg/dL — AB
SPECIFIC GRAVITY, URINE: 1.015 (ref 1.005–1.030)
UROBILINOGEN UA: 0.2 mg/dL (ref 0.0–1.0)
pH: 7 (ref 5.0–8.0)

## 2016-06-09 NOTE — Progress Notes (Signed)
   PRENATAL VISIT NOTE  Subjective:  Alexandria Fisher is a 25 y.o. [phone] at [redacted]w[redacted]d being seen today for ongoing prenatal care.  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  Patient reports no complaints.  Contractions: Irregular. Vag. Bleeding: None.  Movement: Present. Denies leaking of fluid.   The following portions of the patient's history were reviewed and updated as appropriate: allergies, current medications, past family history, past medical history, past social history, past surgical history and problem list. Problem list updated.  Objective:   Vitals:   06/09/16 1246  BP: 121/70  Pulse: 83  Weight: 240 lb 11.2 oz (109.2 kg)    Fetal Status: Fetal Heart Rate (bpm): NST   Movement: Present     General:  Alert, oriented and cooperative. Patient is in no acute distress.  Skin: Skin is warm and dry. No rash noted.   Cardiovascular: Normal heart rate noted  Respiratory: Normal respiratory effort, no problems with respiration noted  Abdomen: Soft, gravid, appropriate for gestational age. Pain/Pressure: Present     Pelvic:  Cervical exam deferred        Extremities: Normal range of motion.  Edema: None  Mental Status: Normal mood and affect. Normal behavior. Normal judgment and thought content.   Urinalysis: Urine Protein: 1+ Urine Glucose: Negative  Assessment and Plan:  Pregnancy: [id number] at [redacted]w[redacted]d  1. Hypertension in pregnancy, antepartum, third trimester BP controlled EFW 69% last week. Continue NST twice weekly NST reactive - Fetal nonstress test  2. Supervision of high-risk pregnancy, third trimester FHT normal.  Preterm labor symptoms and general obstetric precautions including but not limited to vaginal bleeding, contractions, leaking of fluid and fetal movement were reviewed in  detail with the patient. Please refer to After Visit Summary for other counseling recommendations.  Return in about 7 days (around 06/16/2016) for 9/7 or 9/8  Ob fu and NST.  Truett Mainland, DO

## 2016-06-14 ENCOUNTER — Ambulatory Visit (INDEPENDENT_AMBULATORY_CARE_PROVIDER_SITE_OTHER): Payer: Medicaid Other | Admitting: *Deleted

## 2016-06-14 VITALS — BP 104/68 | HR 108

## 2016-06-14 DIAGNOSIS — O163 Unspecified maternal hypertension, third trimester: Secondary | ICD-10-CM | POA: Diagnosis present

## 2016-06-14 DIAGNOSIS — Z36 Encounter for antenatal screening of mother: Secondary | ICD-10-CM

## 2016-06-14 LAB — POCT URINALYSIS DIP (DEVICE)
Glucose, UA: NEGATIVE mg/dL
HGB URINE DIPSTICK: NEGATIVE
Ketones, ur: 80 mg/dL — AB
Nitrite: NEGATIVE
PH: 7 (ref 5.0–8.0)
Protein, ur: NEGATIVE mg/dL
SPECIFIC GRAVITY, URINE: 1.015 (ref 1.005–1.030)
UROBILINOGEN UA: 1 mg/dL (ref 0.0–1.0)

## 2016-06-14 NOTE — Progress Notes (Signed)
NST Note Date: 06/07/2016 Gestational Age: 25/6 FHT: 130 baseline, +accels, no decel, mod var Toco: quiet  A/P: rNST. Continue current plan of care  Durene Romans MD Attending Center for [facility])

## 2016-06-16 ENCOUNTER — Ambulatory Visit (INDEPENDENT_AMBULATORY_CARE_PROVIDER_SITE_OTHER): Payer: Medicaid Other | Admitting: Obstetrics and Gynecology

## 2016-06-16 ENCOUNTER — Other Ambulatory Visit: Payer: Medicaid Other | Admitting: Family Medicine

## 2016-06-16 ENCOUNTER — Other Ambulatory Visit (HOSPITAL_COMMUNITY)
Admission: RE | Admit: 2016-06-16 | Discharge: 2016-06-16 | Disposition: A | Discharge status: Home / Self Care | Payer: Medicaid Other | Source: Ambulatory Visit | Attending: Family Medicine | Admitting: Family Medicine

## 2016-06-16 VITALS — BP 101/68 | HR 99 | Wt 240.9 lb

## 2016-06-16 DIAGNOSIS — O0993 Supervision of high risk pregnancy, unspecified, third trimester: Secondary | ICD-10-CM

## 2016-06-16 DIAGNOSIS — O163 Unspecified maternal hypertension, third trimester: Secondary | ICD-10-CM

## 2016-06-16 DIAGNOSIS — Z113 Encounter for screening for infections with a predominantly sexual mode of transmission: Secondary | ICD-10-CM | POA: Insufficient documentation

## 2016-06-16 LAB — POCT URINALYSIS DIP (DEVICE)
BILIRUBIN URINE: NEGATIVE
GLUCOSE, UA: NEGATIVE mg/dL
Hgb urine dipstick: NEGATIVE
KETONES UR: 15 mg/dL — AB
Nitrite: NEGATIVE
Protein, ur: NEGATIVE mg/dL
SPECIFIC GRAVITY, URINE: 1.015 (ref 1.005–1.030)
Urobilinogen, UA: 0.2 mg/dL (ref 0.0–1.0)
pH: 7 (ref 5.0–8.0)

## 2016-06-16 LAB — OB RESULTS CONSOLE GBS: GBS: POSITIVE

## 2016-06-16 NOTE — Patient Instructions (Signed)

## 2016-06-16 NOTE — Progress Notes (Signed)
   PRENATAL VISIT NOTE  Subjective:  Alexandria Fisher is a 25 y.o. [phone] at [redacted]w[redacted]d being seen today for ongoing prenatal care.  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  Patient reports no complaints.  Contractions: Irregular. Vag. Bleeding: None.  Movement: Present. Denies leaking of fluid.   The following portions of the patient's history were reviewed and updated as appropriate: allergies, current medications, past family history, past medical history, past social history, past surgical history and problem list. Problem list updated.  Objective:   Vitals:   06/16/16 1424  BP: 101/68  Pulse: 99  Weight: 240 lb 14.4 oz (109.3 kg)    Fetal Status: Fetal Heart Rate (bpm): NST   Movement: Present  Presentation: Vertex  General:  Alert, oriented and cooperative. Patient is in no acute distress.  Skin: Skin is warm and dry. No rash noted.   Cardiovascular: Normal heart rate noted  Respiratory: Normal respiratory effort, no problems with respiration noted  Abdomen: Soft, gravid, appropriate for gestational age. Pain/Pressure: Present     Pelvic:  Cervical exam performed Dilation: 1      Extremities: Normal range of motion.  Edema: None  Mental Status: Normal mood and affect. Normal behavior. Normal judgment and thought content.   Urinalysis: Urine Protein: Negative Urine Glucose: Negative   NST reviewed and reactive  Assessment and Plan:  Pregnancy: [id number] at [redacted]w[redacted]d  1. Hypertension in pregnancy, antepartum, third trimester - Fetal nonstress test today, discussed induction at 40 wks if no spontaneous labor before then.  2. Supervision of high-risk pregnancy, third trimester - Culture, beta strep (group b only) - GC/Chlamydia probe amp ([place])not at [facility]  Term labor symptoms and general  obstetric precautions including but not limited to vaginal bleeding, contractions, leaking of fluid and fetal movement were reviewed in detail with the patient. Please refer to After Visit Summary for other counseling recommendations.  Return in about 7 days (around 06/23/2016) for Ob fu and NST.  Waldemar Dickens, MD

## 2016-06-16 NOTE — Progress Notes (Signed)
NST reviewed - reactive

## 2016-06-17 LAB — CULTURE, BETA STREP (GROUP B ONLY)

## 2016-06-17 LAB — GC/CHLAMYDIA PROBE AMP (~~LOC~~) NOT AT ARMC
Chlamydia: NEGATIVE
NEISSERIA GONORRHEA: NEGATIVE

## 2016-06-21 ENCOUNTER — Ambulatory Visit (INDEPENDENT_AMBULATORY_CARE_PROVIDER_SITE_OTHER): Payer: Medicaid Other | Admitting: Advanced Practice Midwife

## 2016-06-21 DIAGNOSIS — N3000 Acute cystitis without hematuria: Secondary | ICD-10-CM | POA: Diagnosis not present

## 2016-06-21 DIAGNOSIS — I10 Essential (primary) hypertension: Secondary | ICD-10-CM

## 2016-06-21 DIAGNOSIS — Z36 Encounter for antenatal screening of mother: Secondary | ICD-10-CM

## 2016-06-21 DIAGNOSIS — O163 Unspecified maternal hypertension, third trimester: Secondary | ICD-10-CM

## 2016-06-21 DIAGNOSIS — N939 Abnormal uterine and vaginal bleeding, unspecified: Secondary | ICD-10-CM | POA: Diagnosis not present

## 2016-06-21 DIAGNOSIS — M79609 Pain in unspecified limb: Secondary | ICD-10-CM | POA: Diagnosis not present

## 2016-06-21 NOTE — Patient Instructions (Signed)

## 2016-06-21 NOTE — Progress Notes (Signed)
NST reactive  Problem box for Hypertension updated with checklist

## 2016-06-23 ENCOUNTER — Ambulatory Visit (INDEPENDENT_AMBULATORY_CARE_PROVIDER_SITE_OTHER): Payer: Medicaid Other | Admitting: Family Medicine

## 2016-06-23 VITALS — BP 120/76 | HR 92 | Wt 241.6 lb

## 2016-06-23 DIAGNOSIS — O163 Unspecified maternal hypertension, third trimester: Secondary | ICD-10-CM | POA: Diagnosis present

## 2016-06-23 DIAGNOSIS — Z22330 Carrier of Group B streptococcus: Secondary | ICD-10-CM | POA: Diagnosis not present

## 2016-06-23 DIAGNOSIS — O0993 Supervision of high risk pregnancy, unspecified, third trimester: Secondary | ICD-10-CM

## 2016-06-23 DIAGNOSIS — O99820 Streptococcus B carrier state complicating pregnancy: Secondary | ICD-10-CM | POA: Insufficient documentation

## 2016-06-23 LAB — POCT URINALYSIS DIP (DEVICE)
Bilirubin Urine: NEGATIVE
GLUCOSE, UA: NEGATIVE mg/dL
Ketones, ur: NEGATIVE mg/dL
Nitrite: NEGATIVE
PROTEIN: NEGATIVE mg/dL
Specific Gravity, Urine: 1.015 (ref 1.005–1.030)
UROBILINOGEN UA: 2 mg/dL — AB (ref 0.0–1.0)
pH: 7 (ref 5.0–8.0)

## 2016-06-23 NOTE — Progress Notes (Signed)
   PRENATAL VISIT NOTE  Subjective:  Alexandria Fisher is a 25 y.o. [phone] at [redacted]w[redacted]d being seen today for ongoing prenatal care.  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  Patient reports no complaints.  Contractions: Irregular. Vag. Bleeding: None.  Movement: Present. Denies leaking of fluid.   The following portions of the patient's history were reviewed and updated as appropriate: allergies, current medications, past family history, past medical history, past social history, past surgical history and problem list. Problem list updated.  Objective:   Vitals:   06/23/16 1455  BP: 120/76  Pulse: 92  Weight: 241 lb 9.6 oz (109.6 kg)    Fetal Status: Fetal Heart Rate (bpm): NST   Movement: Present     General:  Alert, oriented and cooperative. Patient is in no acute distress.  Skin: Skin is warm and dry. No rash noted.   Cardiovascular: Normal heart rate noted  Respiratory: Normal respiratory effort, no problems with respiration noted  Abdomen: Soft, gravid, appropriate for gestational age. Pain/Pressure: Present     Pelvic:  Cervical exam deferred        Extremities: Normal range of motion.  Edema: None  Mental Status: Normal mood and affect. Normal behavior. Normal judgment and thought content.   Urinalysis:      Assessment and Plan:  Pregnancy: [id number] at [redacted]w[redacted]d  1. Supervision of high-risk pregnancy, third trimester NST reactive.  BP normal.  Continue twice weekly testing.    2. Hypertension in pregnancy, antepartum, third trimester - Fetal nonstress test  3.  GBS in pregnancy  Intrapartum treatment.  Term labor symptoms and general obstetric precautions including but not limited to vaginal bleeding, contractions, leaking of fluid and fetal movement were reviewed in detail with the  patient. Please refer to After Visit Summary for other counseling recommendations.  Return in about 7 days (around 06/30/2016) for Ob fu and NST.  Truett Mainland, DO

## 2016-06-23 NOTE — Progress Notes (Signed)
Pt denies H/A or visual disturbances.  

## 2016-06-27 ENCOUNTER — Ambulatory Visit (INDEPENDENT_AMBULATORY_CARE_PROVIDER_SITE_OTHER): Payer: Medicaid Other | Admitting: Family Medicine

## 2016-06-27 VITALS — BP 108/71 | HR 84

## 2016-06-27 DIAGNOSIS — O163 Unspecified maternal hypertension, third trimester: Secondary | ICD-10-CM | POA: Diagnosis not present

## 2016-06-27 DIAGNOSIS — Z36 Encounter for antenatal screening of mother: Secondary | ICD-10-CM | POA: Diagnosis not present

## 2016-06-27 NOTE — Progress Notes (Signed)
NST reviewed - reactive

## 2016-06-30 ENCOUNTER — Ambulatory Visit (INDEPENDENT_AMBULATORY_CARE_PROVIDER_SITE_OTHER): Payer: Medicaid Other | Admitting: Obstetrics and Gynecology

## 2016-06-30 VITALS — BP 124/72 | HR 102 | Wt 239.1 lb

## 2016-06-30 DIAGNOSIS — O163 Unspecified maternal hypertension, third trimester: Secondary | ICD-10-CM

## 2016-06-30 DIAGNOSIS — Z22330 Carrier of Group B streptococcus: Secondary | ICD-10-CM | POA: Diagnosis not present

## 2016-06-30 DIAGNOSIS — O99820 Streptococcus B carrier state complicating pregnancy: Secondary | ICD-10-CM

## 2016-06-30 DIAGNOSIS — O0993 Supervision of high risk pregnancy, unspecified, third trimester: Secondary | ICD-10-CM

## 2016-06-30 LAB — POCT URINALYSIS DIP (DEVICE)
BILIRUBIN URINE: NEGATIVE
Glucose, UA: NEGATIVE mg/dL
HGB URINE DIPSTICK: NEGATIVE
KETONES UR: NEGATIVE mg/dL
NITRITE: NEGATIVE
PH: 6.5 (ref 5.0–8.0)
Protein, ur: 30 mg/dL — AB
Specific Gravity, Urine: 1.015 (ref 1.005–1.030)
Urobilinogen, UA: 1 mg/dL (ref 0.0–1.0)

## 2016-06-30 NOTE — Progress Notes (Signed)
Subjective:  Alexandria Fisher is a 25 y.o. [phone] at [redacted]w[redacted]d being seen today for ongoing prenatal care.  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  Patient reports no complaints.  Contractions: Irregular. Vag. Bleeding: None.  Movement: Present. Denies leaking of fluid.   The following portions of the patient's history were reviewed and updated as appropriate: allergies, current medications, past family history, past medical history, past social history, past surgical history and problem list. Problem list updated.  Objective:   Vitals:   06/30/16 0756 06/30/16 0828  BP: 133/83 124/72  Pulse: (!) 102   Weight: 108.5 kg (239 lb 1.6 oz)     Fetal Status: Fetal Heart Rate (bpm): NST   Movement: Present     General:  Alert, oriented and cooperative. Patient is in no acute distress.  Skin: Skin is warm and dry. No rash noted.   Cardiovascular: Normal heart rate noted  Respiratory: Normal respiratory effort, no problems with respiration noted  Abdomen: Soft, gravid, appropriate for gestational age. Pain/Pressure: Present     Pelvic:  Cervical exam deferred        Extremities: Normal range of motion.  Edema: None  Mental Status: Normal mood and affect. Normal behavior. Normal judgment and thought content.   Urinalysis: Urine Protein: 1+ Urine Glucose: Negative  Assessment and Plan:  Pregnancy: [id number] at [redacted]w[redacted]d  1. Hypertension in pregnancy, antepartum, third trimester BP stable. On BASA qd Continue with antenatal testing IOL at 40 wks  2. Supervision of high-risk pregnancy, third trimester   3. GBS (group B Streptococcus carrier), +RV culture, currently pregnant Treat in labor  Term labor symptoms and general obstetric  precautions including but not limited to vaginal bleeding, contractions, leaking of fluid and fetal movement were reviewed in detail with the patient. Please refer to After Visit Summary for other counseling recommendations.  Return in about 1 week (around 07/07/2016) for Ob fu and NST.   Chancy Milroy, MD

## 2016-06-30 NOTE — Progress Notes (Signed)
rou

## 2016-06-30 NOTE — Progress Notes (Signed)
Pt has H/A today.

## 2016-06-30 NOTE — Progress Notes (Signed)
Pt requests cervical exam

## 2016-07-04 ENCOUNTER — Ambulatory Visit (INDEPENDENT_AMBULATORY_CARE_PROVIDER_SITE_OTHER): Payer: Medicaid Other | Admitting: Obstetrics and Gynecology

## 2016-07-04 VITALS — BP 114/70 | HR 108

## 2016-07-04 DIAGNOSIS — Z36 Encounter for antenatal screening of mother: Secondary | ICD-10-CM

## 2016-07-04 DIAGNOSIS — O163 Unspecified maternal hypertension, third trimester: Secondary | ICD-10-CM

## 2016-07-04 NOTE — Progress Notes (Signed)
Pt inquired if Alexandria Fisher date could be changed because it is the same Alexandria Fisher as her daughter's birthday. Consult w/Dr. Ilda Basset who stated that Alexandria Fisher could be 39-40 wks but not after 40 wks.  Alexandria Fisher date changed to 10/2 @ 0730. Pt will be 39.[redacted] wks EGA on that Alexandria Fisher.

## 2016-07-05 ENCOUNTER — Telehealth (HOSPITAL_COMMUNITY): Payer: Self-pay | Admitting: *Deleted

## 2016-07-05 ENCOUNTER — Encounter (HOSPITAL_COMMUNITY): Payer: Self-pay | Admitting: *Deleted

## 2016-07-05 NOTE — Telephone Encounter (Signed)
Preadmission screen  

## 2016-07-06 ENCOUNTER — Inpatient Hospital Stay (HOSPITAL_COMMUNITY): Payer: Medicaid Other | Admitting: Anesthesiology

## 2016-07-06 ENCOUNTER — Encounter (HOSPITAL_COMMUNITY): Payer: Self-pay | Admitting: *Deleted

## 2016-07-06 ENCOUNTER — Inpatient Hospital Stay (HOSPITAL_COMMUNITY)
Admission: AD | Admit: 2016-07-06 | Discharge: 2016-07-08 | DRG: 774 | Disposition: A | Discharge status: Home / Self Care | Payer: Medicaid Other | Source: Ambulatory Visit | Attending: Family Medicine | Admitting: Family Medicine

## 2016-07-06 DIAGNOSIS — Z8614 Personal history of Methicillin resistant Staphylococcus aureus infection: Secondary | ICD-10-CM | POA: Diagnosis not present

## 2016-07-06 DIAGNOSIS — O99825 Streptococcus B carrier state complicating the puerperium: Secondary | ICD-10-CM

## 2016-07-06 DIAGNOSIS — IMO0001 Reserved for inherently not codable concepts without codable children: Secondary | ICD-10-CM

## 2016-07-06 DIAGNOSIS — O99824 Streptococcus B carrier state complicating childbirth: Secondary | ICD-10-CM | POA: Diagnosis present

## 2016-07-06 DIAGNOSIS — Z3A39 39 weeks gestation of pregnancy: Secondary | ICD-10-CM

## 2016-07-06 DIAGNOSIS — O1002 Pre-existing essential hypertension complicating childbirth: Principal | ICD-10-CM | POA: Diagnosis present

## 2016-07-06 DIAGNOSIS — Z87891 Personal history of nicotine dependence: Secondary | ICD-10-CM

## 2016-07-06 DIAGNOSIS — O1092 Unspecified pre-existing hypertension complicating childbirth: Secondary | ICD-10-CM

## 2016-07-06 LAB — CBC
HCT: 33.8 % — ABNORMAL LOW (ref 36.0–46.0)
Hemoglobin: 11.9 g/dL — ABNORMAL LOW (ref 12.0–15.0)
MCH: 29.2 pg (ref 26.0–34.0)
MCHC: 35.2 g/dL (ref 30.0–36.0)
MCV: 82.8 fL (ref 78.0–100.0)
PLATELETS: 171 10*3/uL (ref 150–400)
RBC: 4.08 MIL/uL (ref 3.87–5.11)
RDW: 12.7 % (ref 11.5–15.5)
WBC: 11.4 10*3/uL — ABNORMAL HIGH (ref 4.0–10.5)

## 2016-07-06 LAB — TYPE AND SCREEN
ABO/RH(D): B POS
Antibody Screen: NEGATIVE

## 2016-07-06 MED ORDER — DIPHENHYDRAMINE HCL 50 MG/ML IJ SOLN: 12.5000 mg | INTRAMUSCULAR | Status: DC | PRN

## 2016-07-06 MED ORDER — BENZOCAINE-MENTHOL 20-0.5 % EX AERO: 1.0000 "application " | INHALATION_SPRAY | CUTANEOUS | Status: DC | PRN

## 2016-07-06 MED ORDER — COCONUT OIL OIL: 1.0000 "application " | TOPICAL_OIL | Status: DC | PRN

## 2016-07-06 MED ORDER — PHENYLEPHRINE 40 MCG/ML (10ML) SYRINGE FOR IV PUSH (FOR BLOOD PRESSURE SUPPORT)
80.0000 ug | PREFILLED_SYRINGE | INTRAVENOUS | Status: DC | PRN
  Filled 2016-07-06: qty 5
  Filled 2016-07-06: qty 10

## 2016-07-06 MED ORDER — ONDANSETRON HCL 4 MG/2ML IJ SOLN: 4.0000 mg | Freq: Four times a day (QID) | INTRAMUSCULAR | Status: DC | PRN

## 2016-07-06 MED ORDER — FENTANYL 2.5 MCG/ML BUPIVACAINE 1/10 % EPIDURAL INFUSION (WH - ANES)
14.0000 mL/h | INTRAMUSCULAR | Status: DC | PRN
  Administered 2016-07-06: 14 mL/h via EPIDURAL
  Filled 2016-07-06: qty 125

## 2016-07-06 MED ORDER — ACETAMINOPHEN 325 MG PO TABS
650.0000 mg | ORAL_TABLET | ORAL | Status: DC | PRN
  Administered 2016-07-07 (×4): 650 mg via ORAL
  Filled 2016-07-06 (×4): qty 2

## 2016-07-06 MED ORDER — OXYCODONE-ACETAMINOPHEN 5-325 MG PO TABS: 1.0000 | ORAL_TABLET | ORAL | Status: DC | PRN

## 2016-07-06 MED ORDER — EPHEDRINE 5 MG/ML INJ
10.0000 mg | INTRAVENOUS | Status: DC | PRN
  Filled 2016-07-06: qty 4

## 2016-07-06 MED ORDER — LACTATED RINGERS IV SOLN
500.0000 mL | INTRAVENOUS | Status: DC | PRN
  Administered 2016-07-06: 500 mL via INTRAVENOUS

## 2016-07-06 MED ORDER — ONDANSETRON HCL 4 MG/2ML IJ SOLN: 4.0000 mg | INTRAMUSCULAR | Status: DC | PRN

## 2016-07-06 MED ORDER — PRENATAL MULTIVITAMIN CH
1.0000 | ORAL_TABLET | Freq: Every day | ORAL | Status: DC
  Administered 2016-07-07 – 2016-07-08 (×2): 1 via ORAL
  Filled 2016-07-06 (×2): qty 1

## 2016-07-06 MED ORDER — PENICILLIN G POTASSIUM 5000000 UNITS IJ SOLR
2.5000 10*6.[IU] | INTRAMUSCULAR | Status: DC
  Filled 2016-07-06 (×3): qty 2.5

## 2016-07-06 MED ORDER — SIMETHICONE 80 MG PO CHEW: 80.0000 mg | CHEWABLE_TABLET | ORAL | Status: DC | PRN

## 2016-07-06 MED ORDER — LACTATED RINGERS IV SOLN
INTRAVENOUS | Status: DC
  Administered 2016-07-06: 125 mL/h via INTRAVENOUS

## 2016-07-06 MED ORDER — DIBUCAINE 1 % RE OINT: 1.0000 "application " | TOPICAL_OINTMENT | RECTAL | Status: DC | PRN

## 2016-07-06 MED ORDER — SOD CITRATE-CITRIC ACID 500-334 MG/5ML PO SOLN: 30.0000 mL | ORAL | Status: DC | PRN

## 2016-07-06 MED ORDER — OXYCODONE-ACETAMINOPHEN 5-325 MG PO TABS: 2.0000 | ORAL_TABLET | ORAL | Status: DC | PRN

## 2016-07-06 MED ORDER — IBUPROFEN 600 MG PO TABS
600.0000 mg | ORAL_TABLET | Freq: Four times a day (QID) | ORAL | Status: DC
  Administered 2016-07-06 – 2016-07-08 (×8): 600 mg via ORAL
  Filled 2016-07-06 (×8): qty 1

## 2016-07-06 MED ORDER — DIPHENHYDRAMINE HCL 25 MG PO CAPS: 25.0000 mg | ORAL_CAPSULE | Freq: Four times a day (QID) | ORAL | Status: DC | PRN

## 2016-07-06 MED ORDER — WITCH HAZEL-GLYCERIN EX PADS: 1.0000 "application " | MEDICATED_PAD | CUTANEOUS | Status: DC | PRN

## 2016-07-06 MED ORDER — LIDOCAINE HCL (PF) 1 % IJ SOLN
INTRAMUSCULAR | Status: DC | PRN
  Administered 2016-07-06 (×2): 4 mL

## 2016-07-06 MED ORDER — PENICILLIN G POTASSIUM 5000000 UNITS IJ SOLR
5.0000 10*6.[IU] | Freq: Once | INTRAVENOUS | Status: AC
  Administered 2016-07-06: 5 10*6.[IU] via INTRAVENOUS
  Filled 2016-07-06: qty 5

## 2016-07-06 MED ORDER — LACTATED RINGERS IV SOLN: 500.0000 mL | Freq: Once | INTRAVENOUS | Status: DC

## 2016-07-06 MED ORDER — OXYTOCIN BOLUS FROM INFUSION
500.0000 mL | Freq: Once | INTRAVENOUS | Status: AC
  Administered 2016-07-06: 500 mL via INTRAVENOUS

## 2016-07-06 MED ORDER — ONDANSETRON HCL 4 MG PO TABS: 4.0000 mg | ORAL_TABLET | ORAL | Status: DC | PRN

## 2016-07-06 MED ORDER — ZOLPIDEM TARTRATE 5 MG PO TABS: 5.0000 mg | ORAL_TABLET | Freq: Every evening | ORAL | Status: DC | PRN

## 2016-07-06 MED ORDER — FENTANYL CITRATE (PF) 100 MCG/2ML IJ SOLN
100.0000 ug | INTRAMUSCULAR | Status: DC | PRN
  Administered 2016-07-06: 100 ug via INTRAVENOUS
  Filled 2016-07-06: qty 2

## 2016-07-06 MED ORDER — OXYTOCIN 40 UNITS IN LACTATED RINGERS INFUSION - SIMPLE MED
2.5000 [IU]/h | INTRAVENOUS | Status: DC
  Filled 2016-07-06: qty 1000

## 2016-07-06 MED ORDER — TETANUS-DIPHTH-ACELL PERTUSSIS 5-2.5-18.5 LF-MCG/0.5 IM SUSP: 0.5000 mL | Freq: Once | INTRAMUSCULAR | Status: DC

## 2016-07-06 MED ORDER — SENNOSIDES-DOCUSATE SODIUM 8.6-50 MG PO TABS
2.0000 | ORAL_TABLET | ORAL | Status: DC
  Administered 2016-07-07 (×2): 2 via ORAL
  Filled 2016-07-06 (×2): qty 2

## 2016-07-06 MED ORDER — ACETAMINOPHEN 325 MG PO TABS: 650.0000 mg | ORAL_TABLET | ORAL | Status: DC | PRN

## 2016-07-06 MED ORDER — LIDOCAINE HCL (PF) 1 % IJ SOLN
30.0000 mL | INTRAMUSCULAR | Status: DC | PRN
  Filled 2016-07-06: qty 30

## 2016-07-06 MED ORDER — OXYCODONE HCL 5 MG PO TABS
10.0000 mg | ORAL_TABLET | ORAL | Status: DC | PRN
  Administered 2016-07-07: 10 mg via ORAL
  Filled 2016-07-06: qty 2

## 2016-07-06 MED ORDER — OXYCODONE HCL 5 MG PO TABS
5.0000 mg | ORAL_TABLET | ORAL | Status: DC | PRN
  Administered 2016-07-07 – 2016-07-08 (×3): 5 mg via ORAL
  Filled 2016-07-06 (×3): qty 1

## 2016-07-06 NOTE — Anesthesia Procedure Notes (Signed)
Epidural Patient location during procedure: OB  Staffing Anesthesiologist: Lauretta Grill Performed: anesthesiologist   Preanesthetic Checklist Completed: patient identified, site marked, surgical consent, pre-op evaluation, timeout performed, IV checked, risks and benefits discussed and monitors and equipment checked  Epidural Patient position: sitting Prep: site prepped and draped and DuraPrep Patient monitoring: continuous pulse ox and blood pressure Approach: midline Location: L3-L4 Injection technique: LOR saline  Needle:  Needle type: Tuohy  Needle gauge: 17 G Needle length: 9 cm and 9 Needle insertion depth: 5 cm cm Catheter type: closed end flexible Catheter size: 19 Gauge Catheter at skin depth: 10 cm Test dose: negative  Assessment Events: blood not aspirated, injection not painful, no injection resistance, negative IV test and no paresthesia  Additional Notes Patient identified. Risks/Benefits/Options discussed with patient including but not limited to bleeding, infection, nerve damage, paralysis, failed block, incomplete pain control, headache, blood pressure changes, nausea, vomiting, reactions to medication both or allergic, itching and postpartum back pain. Confirmed with bedside nurse the patient's most recent platelet count. Confirmed with patient that they are not currently taking any anticoagulation, have any bleeding history or any family history of bleeding disorders. Patient expressed understanding and wished to proceed. All questions were answered. Sterile technique was used throughout the entire procedure. Please see nursing notes for vital signs. Test dose was given through epidural catheter and negative prior to continuing to dose epidural or start infusion. Warning signs of high block given to the patient including shortness of breath, tingling/numbness in hands, complete motor block, or any concerning symptoms with instructions to call for help. Patient was  given instructions on fall risk and not to get out of bed. All questions and concerns addressed with instructions to call with any issues or inadequate analgesia.

## 2016-07-06 NOTE — H&P (Signed)
[place] is a 25 y.o. female [phone] with IUP at [redacted]w[redacted]d by L/6 presenting for contraction and light vaginal bleeding. Pregnancy complicated by cHTN on no medications, obesity. She reports +FMs, No LOF, no VB, no blurry vision, headaches or peripheral edema, and RUQ pain.  She plans on bottle feeding. She request nexplanon for birth control.  Dating: By L/6 --->  Estimated Date of Delivery: 07/13/16   Clinic [facility] Prenatal Labs  Dating LMP c/w 6 week scan Blood type: --/--/B POS (02/12 0841)   Genetic Screen 1 Screen:  NT nml    AFP:      Antibody: neg  Anatomic [place]  Nml @ 18 wks; fibroids Rubella:  immune  GTT  Third trimester: 86 RPR:   nr  Flu vaccine Declined HBsAg:   neg  TDaP vaccine       declined                                        HIV: Non Reactive (02/12 0841)   Baby Food Bottle                                              GBS: positive (For PCN allergy, check sensitivities)  Contraception Nexplanon Pap: Normal 12/31/15  Circumcision N/a; female Hgb electrophoresis : AS  Pediatrician [facility] (Dr. Suzan Slick)   Support Person Uncle, Brother, FOB    Prenatal History/Complications:  Past Medical History: Past Medical History:  Diagnosis Date  . Chlamydia   . Chronic hypertension   . Depression   . 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 Hx MRSA infection 07/14/2011  . Infection     Past Surgical History: Past Surgical History:  Procedure Laterality Date  . DILATION AND EVACUATION N/A 09/19/2013   Procedure: DILATATION AND EVACUATION;  Surgeon: Guss Bunde, MD;  Location: [facility];  Service: Gynecology;  Laterality: N/A;    Obstetrical History: OB History    Gravida Para Term Preterm AB Living   5 2 2   2 2    SAB TAB Ectopic Multiple Live Births   2       2      Social History: Social History   Social History  . Marital status: Single    Spouse name: N/A  . Number of children: N/A  . Years of  education: N/A   Social History Main Topics  . Smoking status: Former Smoker    Packs/day: 0.50    Types: Cigarettes    Quit date: 11/05/2015  . Smokeless tobacco: Former Systems developer  . Alcohol use No     Comment: former drinker  . Drug use: No  . Sexual activity: Yes    Birth control/ protection: None     Comment: MIRENA   Other Topics Concern  . None   Social History Narrative  . None    Family History: Family History  Problem Relation Age of Onset  . Breast cancer Maternal Grandmother   . Anemia Mother     Allergies: No Known Allergies  Prescriptions Prior to Admission  Medication Sig Dispense Refill Last Dose  . acetaminophen (TYLENOL) 325 MG tablet Take 650 mg by mouth every 6 (six) hours as needed for moderate pain  or headache.    Past Month at Unknown time  . aspirin EC 81 MG tablet Take 1 tablet (81 mg total) by mouth daily. For prevention of preeclampsia 300 tablet 2 07/05/2016 at Unknown time  . calcium carbonate (TUMS - DOSED IN MG ELEMENTAL CALCIUM) 500 MG chewable tablet Chew 1 tablet by mouth as needed for indigestion or heartburn.   07/05/2016 at Unknown time  . Prenatal Multivit-Min-Fe-FA (PRENATAL VITAMINS) 0.8 MG tablet Take 1 tablet by mouth daily. 90 tablet 4 07/05/2016 at Unknown time     Review of Systems   All systems reviewed and negative except as stated in HPI  Blood pressure (!) 141/83, pulse 96, temperature 98.6 F (37 C), temperature source Oral, resp. rate 18, height 5\' 7"  (1.702 m), weight 240 lb (108.9 kg), last menstrual period 10/06/2015, unknown if currently breastfeeding. General appearance: alert, cooperative and appears stated age Lungs: clear to auscultation bilaterally Heart: regular rate and rhythm Abdomen: soft, non-tender; bowel sounds normal Pelvic: adequate Extremities: Homans sign is negative, no sign of DVT DTR's wnl Presentation: cephalic Fetal monitoringBaseline: 135 bpm, Variability: Good {> 6 bpm), Accelerations: Reactive  and Decelerations: Absent Uterine activityFrequency: Every 5 minutes Dilation: 5 Effacement (%): 80 Station: -2 Exam by:: Velna Ochs RN   Prenatal labs: ABO, Rh: B/POS/-- (03/23 1014) Antibody: NEG (03/23 1014) Rubella: 2.88 (03/23 1014) RPR: NON REAC (07/06 1539)  HBsAg: NEGATIVE (03/23 1014)  HIV: NONREACTIVE (07/06 1539)  GBS: Positive (09/07 0000)  1 hr Glucola 86 Genetic screening  NT wnl Anatomy US wnl  Prenatal Transfer Tool  Maternal Diabetes: No Genetic Screening: Normal Maternal Ultrasounds/Referrals: Normal Fetal Ultrasounds or other Referrals:  None Maternal Substance Abuse:  No Significant Maternal Medications:  None Significant Maternal Lab Results: Lab values include: Group B Strep positive. CT treated x 2 this pregnancy, negative TOC 9/7  Results for orders placed or performed during the hospital encounter of 07/06/16 (from the past 24 hour(s))  CBC   Collection Time: 07/06/16  1:40 PM  Result Value Ref Range   WBC 11.4 (H) 4.0 - 10.5 K/uL   RBC 4.08 3.87 - 5.11 MIL/uL   Hemoglobin 11.9 (L) 12.0 - 15.0 g/dL   HCT 33.8 (L) 36.0 - 46.0 %   MCV 82.8 78.0 - 100.0 fL   MCH 29.2 26.0 - 34.0 pg   MCHC 35.2 30.0 - 36.0 g/dL   RDW 12.7 11.5 - 15.5 %   Platelets 171 150 - 400 K/uL    Patient Active Problem List   Diagnosis Date Noted  . Active labor 07/06/2016  . GBS (group B Streptococcus carrier), +RV culture, currently pregnant 06/23/2016  . 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 Supervision of high-risk pregnancy 12/31/2015  . Obesity in pregnancy, antepartum 10/24/2013  . Hypertension 10/24/2013    Assessment/Plan:  Alexandria Fisher is a 25 y.o. [phone] at [redacted]w[redacted]d here for contractions/active labor  #Labor: Expectant managment #Pain: Epidural prn, fentanyl q1 hr writtent #FWB: Cat I #ID:  GBS pos, PCN ordered #MOF: bottle #MOC:nexplann #Circ:  Female.  Caren Macadam, MD  07/06/2016, 2:12 PM

## 2016-07-06 NOTE — MAU Note (Signed)
Keep having contractions, pain and pressure in bottom, back aches, lower abd pain.  Pain started last night, has gotten worse. Was 1 cm when last checked.

## 2016-07-06 NOTE — Progress Notes (Signed)
Alexandria Fisher is a 25 y.o. [phone] at [redacted]w[redacted]d by  LMP  c/w 6 week admitted for active labor  Subjective: Pt breathing with contractions, coping well, does not desire epidural but would like something else for pain management.   Objective: BP (!) 135/92   Pulse 95   Temp 98.6 F (37 C) (Oral)   Resp 16   Ht 5\' 7"  (1.702 m)   Wt 108.9 kg (240 lb)   LMP 10/06/2015 (Within Weeks)   BMI 37.59 kg/m  No intake/output data recorded. No intake/output data recorded.  FHT:  FHR: 135 bpm, variability: moderate,  accelerations:  Present,  decelerations:  Absent UC:   regular, every 3 minutes SVE:   Dilation: 8 Effacement (%): 90 Station: -1 Exam by:: Dorinda Hill RN  Labs: Lab Results  Component Value Date   WBC 11.4 (H) 07/06/2016   HGB 11.9 (L) 07/06/2016   HCT 33.8 (L) 07/06/2016   MCV 82.8 07/06/2016   PLT 171 07/06/2016    Assessment / Plan: Spontaneous labor, progressing normally    Labor: Progressing normally. Offered nitrous oxide for labor management. Pt would like to try.  Will not augment at this time r/t GBS positive.  Preeclampsia:  n/a Fetal Wellbeing:  Category I Pain Control:  IV pain meds I/D:  GBS positive, one dose of PCN administered Anticipated MOD:  NSVD  LEFTWICH-KIRBY, Alexandria Fisher 07/06/2016, 3:51 PM

## 2016-07-06 NOTE — MAU Note (Signed)
Urine in lab 

## 2016-07-06 NOTE — Anesthesia Preprocedure Evaluation (Signed)
Anesthesia Evaluation  Patient identified by MRN, date of birth, ID band Patient awake    Reviewed: Allergy & Precautions, NPO status , Patient's Chart, lab work & pertinent test results  History of Anesthesia Complications Negative for: history of anesthetic complications  Airway Mallampati: II  TM Distance: >3 FB Neck ROM: Full    Dental no notable dental hx. (+) Dental Advisory Given   Pulmonary former smoker,    Pulmonary exam normal breath sounds clear to auscultation       Cardiovascular hypertension (cHTN), Normal cardiovascular exam Rhythm:Regular Rate:Normal     Neuro/Psych PSYCHIATRIC DISORDERS Anxiety Depression negative neurological ROS     GI/Hepatic negative GI ROS, Neg liver ROS,   Endo/Other  obesity  Renal/GU negative Renal ROS  negative genitourinary   Musculoskeletal negative musculoskeletal ROS (+)   Abdominal   Peds negative pediatric ROS (+)  Hematology negative hematology ROS (+)   Anesthesia Other Findings   Reproductive/Obstetrics (+) Pregnancy                             Anesthesia Physical Anesthesia Plan  ASA: II  Anesthesia Plan: Epidural   Post-op Pain Management:    Induction:   Airway Management Planned:   Additional Equipment:   Intra-op Plan:   Post-operative Plan:   Informed Consent: I have reviewed the patients History and Physical, chart, labs and discussed the procedure including the risks, benefits and alternatives for the proposed anesthesia with the patient or authorized representative who has indicated his/her understanding and acceptance.   Dental advisory given  Plan Discussed with: CRNA  Anesthesia Plan Comments: (Discussed with the patient that it is always possible at this late dilation that she could have the baby before the epidural has time to take effect and she desired to proceed with that understanding)         Anesthesia Quick Evaluation

## 2016-07-07 ENCOUNTER — Other Ambulatory Visit: Payer: Medicaid Other | Admitting: Family Medicine

## 2016-07-07 LAB — RPR: RPR: NONREACTIVE

## 2016-07-07 NOTE — Progress Notes (Signed)
Evaluated patient d/t complaints of backache and headache.  Headache: patient reports that HA does not get better or worse when lying flat vs. Standing.  Patient reports that HA does not get better or worse when light is on vs. Dark room.  Patient sitting straight up in bed during anesthesia interview.  Patient asked whether she normally drinks caffeine in the morning and patient denies drinking caffeine.  Patient did state that she has not eaten this morning yet, and wants to see whether HA is helped by eating food.  Patient has already received NSAID and opioid this morning, with no relief from opioid.    Backache: patient described backache as soreness.  I told patient that this is to be expected, since the epidural needle was large and although she does not report getting relief from epidural, the needle was still placed and back soreness and bruising was not abnormal.  Patient reports rapid labor and that her legs never went numb.  I asked patient if her back pain was strong or sharp or like someone had punched her in the back, and she denied all of these descriptions.  I asked patient if her legs had ever gone to sleep and she denied this as well.  Patient informed to tell her postpartum RN if her HA does not get better after eating, or if her HA or BA worsen.  Will inform MDA of findings.  Ladora Daniel CRNA

## 2016-07-07 NOTE — Discharge Instructions (Signed)

## 2016-07-07 NOTE — Lactation Note (Signed)
This note was copied from a baby's chart. Lactation Consultation Note  Mother states she initially put baby to the breast after birth but plans to only formula feed.    Patient Name: Alexandria Fisher [id number] Date: 07/07/2016     Maternal Data    Feeding    LATCH Score/Interventions                      Lactation Tools Discussed/Used     Consult Status      Vivianne Master [facility] 07/07/2016, 11:09 AM

## 2016-07-07 NOTE — Progress Notes (Signed)
Post Partum Day #1 Subjective: no complaints, up ad lib and tolerating PO; breast and bottlefeeding; desires Nexplanon  Objective: Blood pressure 129/76, pulse 82, temperature 99.1 F (37.3 C), temperature source Oral, resp. rate 18, height 5\' 7"  (1.702 m), weight 108.9 kg (240 lb), last menstrual period 10/06/2015, SpO2 99 %, unknown if currently breastfeeding.  Physical Exam:  General: alert, cooperative and no distress Lochia: appropriate Uterine Fundus: firm DVT Evaluation: No evidence of DVT seen on physical exam.   Recent Labs  07/06/16 1340  HGB 11.9*  HCT 33.8*    Assessment/Plan: Plan for discharge tomorrow   LOS: 1 day   Serita Grammes CNM 07/07/2016, 9:23 AM

## 2016-07-07 NOTE — Anesthesia Postprocedure Evaluation (Signed)
Anesthesia Post Note  Patient: Alexandria Fisher  Procedure(s) Performed: * No procedures listed *  Patient location during evaluation: Mother Baby Anesthesia Type: Epidural Level of consciousness: awake Pain management: pain level controlled Vital Signs Assessment: post-procedure vital signs reviewed and stable Respiratory status: spontaneous breathing Cardiovascular status: stable Postop Assessment: no headache, epidural receding and patient able to bend at knees Anesthetic complications: no     Last Vitals:  Vitals:   07/06/16 1848 07/06/16 2254  BP: 132/78 132/76  Pulse: 91 93  Resp: 18 18  Temp: 37.2 C 37.3 C    Last Pain:  Vitals:   07/07/16 0520  TempSrc:   PainSc: 5    Pain Goal:                 Everette Rank

## 2016-07-07 NOTE — Progress Notes (Signed)
UR chart review completed.  

## 2016-07-07 NOTE — Progress Notes (Signed)
Spoke with teaching service resident and [name] from anesthesia about complaint of headache and backache of 7/10 upon pain reassessment from medication. [name] states that someone from anesthesia will come to bedside to evaluate. Alexandria Fisher

## 2016-07-07 NOTE — Discharge Summary (Signed)
OB Discharge Summary     Patient Name: Alexandria Fisher DOB: Sep 29, 1991 MRN: [id number]   Date of admission: 07/06/2016 Delivering MD: Fatima Blank A   Date of discharge: 07/08/2016  Admitting diagnosis: 82WKS BAD PAIN AND PRESSURE Intrauterine pregnancy: [redacted]w[redacted]d     Secondary diagnosis:  Active Problems:   Active labor   NSVD (normal spontaneous vaginal delivery)  Additional problems: CHTN, no meds     Discharge diagnosis: Term Pregnancy Delivered                                                                                                Post partum procedures:none  Augmentation: n/a  Complications: None  Hospital course:  Onset of Labor With Vaginal Delivery     25 y.o. yo [id number] at [redacted]w[redacted]d was admitted in Active Labor on 07/06/2016. Patient had an uncomplicated labor course as follows:  Membrane Rupture Time/Date: 4:13 PM ,07/06/2016   Intrapartum Procedures: Episiotomy: None [1]                                         Lacerations:  None [1]  Patient had a delivery of a Viable infant. 07/06/2016  Information for the patient's newborn:  Denija, [place] [id number]  Delivery Method: Vaginal, Spontaneous Delivery (Filed from Delivery Summary)    Pateint had an uncomplicated postpartum course.  She is ambulating, tolerating a regular diet, passing flatus, and urinating well. Patient is discharged home in stable condition on 07/08/16.    Physical exam  Vitals:   07/06/16 2254 07/07/16 0845 07/07/16 1820 07/08/16 0500  BP: 132/76 129/76 (!) 144/89 121/72  Pulse: 93 82 80 71  Resp: 18  18 18   Temp: 99.1 F (37.3 C)  97.9 F (36.6 C) 97.9 F (36.6 C)  TempSrc: Oral  Oral   SpO2: 99%     Weight:      Height:       General: alert, cooperative and no distress Lochia: appropriate Uterine Fundus: firm Incision: N/A DVT Evaluation: No evidence of DVT seen on physical exam. Labs: Lab Results  Component Value Date   WBC 11.4 (H) 07/06/2016   HGB 11.9  (L) 07/06/2016   HCT 33.8 (L) 07/06/2016   MCV 82.8 07/06/2016   PLT 171 07/06/2016   CMP Latest Ref Rng & Units 03/16/2016  Glucose 65 - 99 mg/dL 104(H)  BUN 6 - 20 mg/dL 6  Creatinine 0.44 - 1.00 mg/dL 0.60  Sodium 135 - 145 mmol/L 135  Potassium 3.5 - 5.1 mmol/L 3.6  Chloride 101 - 111 mmol/L 104  CO2 22 - 32 mmol/L 24  Calcium 8.9 - 10.3 mg/dL 9.3  Total Protein 6.5 - 8.1 g/dL 7.0  Total Bilirubin 0.3 - 1.2 mg/dL 0.6  Alkaline Phos 38 - 126 U/L 40  AST 15 - 41 U/L 18  ALT 14 - 54 U/L 15    Discharge instruction: per After Visit Summary and "Baby and Me Booklet".  After visit meds:  Medication List    STOP taking these medications   acetaminophen 325 MG tablet Commonly known as:  TYLENOL   aspirin EC 81 MG tablet   calcium carbonate 500 MG chewable tablet Commonly known as:  TUMS - dosed in mg elemental calcium     TAKE these medications   ibuprofen 600 MG tablet Commonly known as:  ADVIL,MOTRIN Take 1 tablet (600 mg total) by mouth every 6 (six) hours.   Prenatal Vitamins 0.8 MG tablet Take 1 tablet by mouth daily.       Diet: routine diet  Activity: Advance as tolerated. Pelvic rest for 6 weeks.   Outpatient follow up: Follow up Appt: Future Appointments Date Time Provider [place]  07/11/2016 7:30 AM [facility] ROOM [facility] None  08/10/2016 10:40 AM Manya Silvas, CNM [facility]   Follow up Visit:No Follow-up on file.  Postpartum contraception: Nexplanon  Newborn Data: Live born female  Birth Weight: 7 lb 11.1 oz (3490 g) APGAR: 9, 9  Baby Feeding: Bottle Disposition:home with mother   07/08/2016 Wyvonnia Dusky, CNM

## 2016-07-07 NOTE — Progress Notes (Signed)
MOB was referred for history of depression/anxiety. * Referral screened out by Clinical Social Worker because none of the following criteria appear to apply: ~ History of anxiety/depression during this pregnancy, or of post-partum depression. ~ Diagnosis of anxiety and/or depression within last 3 years OR * MOB's symptoms currently being treated with medication and/or therapy. Please contact the Clinical Social Worker if needs arise, or if MOB requests.  Evaluation by OB CSW notes no current concerns (dated 2017).

## 2016-07-08 MED ORDER — IBUPROFEN 600 MG PO TABS: 600.0000 mg | ORAL_TABLET | Freq: Four times a day (QID) | ORAL | 0 refills | Status: DC

## 2016-07-11 ENCOUNTER — Inpatient Hospital Stay (HOSPITAL_COMMUNITY)
Admission: RE | Admit: 2016-07-11 | Payer: Medicaid Other | Source: Ambulatory Visit | Attending: Obstetrics and Gynecology | Admitting: Obstetrics and Gynecology

## 2016-07-11 ENCOUNTER — Telehealth: Payer: Self-pay | Admitting: *Deleted

## 2016-07-11 ENCOUNTER — Other Ambulatory Visit: Payer: Medicaid Other | Admitting: Family Medicine

## 2016-07-11 NOTE — Telephone Encounter (Signed)
Zoiee left a message this am requesting a call back.

## 2016-07-12 NOTE — Telephone Encounter (Signed)
I called Alexandria Fisher back. She had a vaginal delivery 07/06/16 without any episiotomy or tears.  She c/o bad cramping, perineal soreness, lower back pain. States she is taking ibuprofen every 4-6 hours. I instructed her not to take it every 4 hours- but to take the ibuprofen every 6 hours as needed for pain, drink a lot of fluids and take with food to minimize gi distress.  I advised her make take sitz baths or warm baths to help with perineal pain. We discussed cramping worse in first week for multips and will get better  A little each day. Discussed many muscles used in labor and delivery . If motrin , sitz, baths not helping and feels like pain is severe come to mau for evaluation as we do not prescribe stronger meds without evaluation. She voices understanding.

## 2016-07-13 ENCOUNTER — Inpatient Hospital Stay (HOSPITAL_COMMUNITY): Payer: Medicaid Other

## 2016-07-21 ENCOUNTER — Other Ambulatory Visit: Payer: Self-pay | Admitting: Obstetrics & Gynecology

## 2016-07-21 DIAGNOSIS — O165 Unspecified maternal hypertension, complicating the puerperium: Secondary | ICD-10-CM

## 2016-07-21 MED ORDER — HYDROCHLOROTHIAZIDE 25 MG PO TABS: 25.0000 mg | ORAL_TABLET | Freq: Every day | ORAL | 3 refills | Status: DC

## 2016-08-10 ENCOUNTER — Ambulatory Visit: Payer: Medicaid Other | Admitting: Advanced Practice Midwife

## 2016-08-24 ENCOUNTER — Ambulatory Visit: Payer: Medicaid Other | Admitting: Advanced Practice Midwife

## 2016-08-25 ENCOUNTER — Encounter: Payer: Self-pay | Admitting: Medical

## 2016-08-25 ENCOUNTER — Ambulatory Visit (INDEPENDENT_AMBULATORY_CARE_PROVIDER_SITE_OTHER): Payer: Medicaid Other | Admitting: Medical

## 2016-08-25 DIAGNOSIS — I10 Essential (primary) hypertension: Secondary | ICD-10-CM

## 2016-08-25 LAB — POCT PREGNANCY, URINE: PREG TEST UR: NEGATIVE

## 2016-08-25 NOTE — Progress Notes (Signed)
Subjective:     Alexandria Fisher is a 25 y.o. female who presents for a postpartum visit. She is 5 weeks postpartum following a spontaneous vaginal delivery. I have fully reviewed the prenatal and intrapartum course. The delivery was at 19 gestational weeks. Outcome: spontaneous vaginal delivery. Anesthesia: epidural. Postpartum course has been normal. Baby's course has been normal. Baby is feeding by bottle - Similac Advance. Bleeding changing a thin pad every 2 hours. Bowel function is normal. Bladder function is normal. Patient is not sexually active. Contraception method is none. Postpartum depression screening: negative.  The following portions of the patient's history were reviewed and updated as appropriate: allergies, current medications, past family history, past medical history, past social history, past surgical history and problem list.  Review of Systems Pertinent items are noted in HPI.   Objective:    BP 128/69   Pulse 84   Wt 228 lb (103.4 kg)   Breastfeeding? No   BMI 35.71 kg/m   General:  alert and cooperative   Breasts:  not performed  Lungs: clear to auscultation bilaterally  Heart:  regular rate and rhythm, S1, S2 normal, no murmur, click, rub or gallop  Abdomen: soft, non-tender; bowel sounds normal; no masses,  no organomegaly   Vulva:  not evaluated  Vagina: not evaluated  Cervix:  not evaluated  Corpus: not examined  Adnexa:  not evaluated  Rectal Exam: Not performed.        Assessment:     Normal postpartum exam. Pap smear not done at today's visit.  Last pap smear normal 12/2015.  Chronic HTN  Plan:    1. Contraception: abstinence and condoms 2. Patient will decide on additional birth control and call the office to initiate when decided 3. Patient referred to [facility] for HTN management  4.  Follow up in: a few weeks when decided on birth control or sooner as needed.    Luvenia Redden, PA-C  08/25/2016 3:00 PM

## 2016-08-25 NOTE — Patient Instructions (Signed)
Contraception Choices Birth control (contraception) is the use of any methods or devices to stop pregnancy from happening. Below are some methods to help avoid pregnancy. Hormonal birth control  A small tube put under the skin of the upper arm (implant). The tube can stay in place for 3 years. The implant must be taken out after 3 years.  Shots given every 3 months.  Pills taken every day.  Patches that are changed once a week.  A ring put into the vagina (vaginal ring). The ring is left in place for 3 weeks and removed for 1 week. Then, a new ring is put in the vagina.  Emergency birth control pills taken after unprotected sex (intercourse). Barrier birth control  A thin covering worn on the penis (female condom) during sex.  A soft, loose covering put into the vagina (female condom) before sex.  A rubber bowl that sits over the cervix (diaphragm). The bowl must be made for you. The bowl is put into the vagina before sex. The bowl is left in place for 6 to 8 hours after sex.  A small, soft cup that fits over the cervix (cervical cap). The cup must be made for you. The cup can be left in place for 48 hours after sex.  A sponge that is put into the vagina before sex.  A chemical that kills or stops sperm from getting into the cervix and uterus (spermicide). The chemical may be a cream, jelly, foam, or pill. Intrauterine (IUD) birth control  IUD birth control is a small, T-shaped piece of plastic. The plastic is put inside the uterus. There are 2 types of IUD:  Copper IUD. The IUD is covered in copper wire. The copper makes a fluid that kills sperm. It can stay in place for 10 years.  Hormone IUD. The hormone stops pregnancy from happening. It can stay in place for 5 years. Permanent methods  When the woman has her fallopian tubes sealed, tied, or blocked during surgery. This stops the egg from traveling to the uterus.  The doctor places a small coil or insert into each fallopian  tube. This causes scar tissue to form and blocks the fallopian tubes.  When the female has the tubes that carry sperm tied off (vasectomy). Natural family planning birth control  Natural family planning means not having sex or using barrier birth control on the days the woman could become pregnant.  Use a calendar to keep track of the length of each period and know the days she can get pregnant.  Avoid sex during ovulation.  Use a thermometer to measure body temperature. Also watch for symptoms of ovulation.  Time sex to be after the woman has ovulated. Use condoms to help protect yourself against sexually transmitted infections (STIs). Do this no matter what type of birth control you use. Talk to your doctor about which type of birth control is best for you. This information is not intended to replace advice given to you by your health care provider. Make sure you discuss any questions you have with your health care provider. Document Released: 07/24/2009 Document Revised: 03/03/2016 Document Reviewed: 04/17/2013 Elsevier Interactive Patient Education  2017 [employer].

## 2016-09-05 ENCOUNTER — Encounter: Payer: Self-pay | Admitting: Family Medicine

## 2016-10-27 ENCOUNTER — Ambulatory Visit: Payer: Medicaid Other | Admitting: Obstetrics and Gynecology

## 2016-12-18 ENCOUNTER — Encounter (HOSPITAL_COMMUNITY): Payer: Self-pay | Admitting: *Deleted

## 2016-12-18 ENCOUNTER — Ambulatory Visit (HOSPITAL_COMMUNITY)
Admission: EM | Admit: 2016-12-18 | Discharge: 2016-12-18 | Disposition: A | Discharge status: Home / Self Care | Payer: Medicaid Other | Attending: Family Medicine | Admitting: Family Medicine

## 2016-12-18 DIAGNOSIS — J111 Influenza due to unidentified influenza virus with other respiratory manifestations: Secondary | ICD-10-CM

## 2016-12-18 DIAGNOSIS — R69 Illness, unspecified: Secondary | ICD-10-CM

## 2016-12-18 NOTE — ED Provider Notes (Signed)
[facility]    CSN: [id number] Arrival date & time: 12/18/16  1301     History   Chief Complaint Chief Complaint  Patient presents with  . Cough  . Sore Throat    HPI Alexandria Fisher is a 26 y.o. female.   HPI  Duration: 3 days  Associated symptoms: sinus headache, sinus congestion, sore throat, cough, and myalgia Denies: sinus pain, itchy watery eyes, ear pain, ear drainage, shortness of breath and fevers/rigors Treatment to date: Theraflu Sick contacts: No    Past Medical History:  Diagnosis Date  . Chlamydia   . Chronic hypertension   . Depression   . 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 Hx MRSA infection 07/14/2011  . Infection     Patient Active Problem List   Diagnosis Date Noted  . 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 Hypertension 10/24/2013    Past Surgical History:  Procedure Laterality Date  . DILATION AND EVACUATION N/A 09/19/2013   Procedure: DILATATION AND EVACUATION;  Surgeon: Guss Bunde, MD;  Location: [facility];  Service: Gynecology;  Laterality: N/A;    OB History    Gravida Para Term Preterm AB Living   5 3 3   2 3    SAB TAB Ectopic Multiple Live Births   2     0 3       Home Medications    Prior to Admission medications   Medication Sig Start Date End Date Taking? Authorizing Provider  hydrochlorothiazide (HYDRODIURIL) 25 MG tablet Take 1 tablet (25 mg total) by mouth daily. 07/21/16  Yes Lavonia Drafts, MD  Prenatal Multivit-Min-Fe-FA (PRENATAL VITAMINS) 0.8 MG tablet Take 1 tablet by mouth daily. 12/09/15  Yes Gwynne Edinger, MD  ibuprofen (ADVIL,MOTRIN) 600 MG tablet Take 1 tablet (600 mg total) by mouth every 6 (six) hours. 07/08/16   Christin Fudge, CNM    Family History Family History  Problem Relation Age of Onset  . Breast cancer Maternal Grandmother   . Anemia Mother     Social History Social History  Substance Use Topics  . Smoking status: Former Smoker   Packs/day: 0.50    Types: Cigarettes    Quit date: 11/05/2015  . Smokeless tobacco: Former Systems developer  . Alcohol use No     Comment: former drinker     Allergies   Patient has no known allergies.   Review of Systems Review of Systems HEENT: As noted in HPI Const: No fevers  Physical Exam Triage Vital Signs ED Triage Vitals  Enc Vitals Group     BP 12/18/16 1330 131/66     Pulse Rate 12/18/16 1330 95     Resp 12/18/16 1330 16     Temp 12/18/16 1330 98.3 F (36.8 C)     Temp Source 12/18/16 1330 Oral     SpO2 12/18/16 1330 100 %    Updated Vital Signs BP 131/66   Pulse 95   Temp 98.3 F (36.8 C) (Oral)   Resp 16   LMP 12/06/2016 (Approximate)   SpO2 100%   Breastfeeding? No   Physical Exam  Constitutional: She appears well-developed and well-nourished.  HENT:  Head: Normocephalic and atraumatic.  Right Ear: External ear normal.  Left Ear: External ear normal.  Nose: Nose normal.  Mouth/Throat: Oropharynx is clear and moist.  Eyes: Conjunctivae and EOM are normal. Pupils are equal, round, and reactive to light.  Neck: Normal range of motion. Neck supple. No thyromegaly present.  Cardiovascular: Normal  rate and regular rhythm.   No murmur heard. Pulmonary/Chest: Effort normal and breath sounds normal. No respiratory distress. She has no wheezes. She has no rales.  Skin: Skin is warm and dry. She is not diaphoretic.  Psychiatric: She has a normal mood and affect. Judgment normal.     UC Treatments / Results  Procedures Procedures  -none  Initial Impression / Assessment and Plan / UC Course  I have reviewed the triage vital signs and the nursing notes.  Pertinent labs & imaging results that were available during my care of the patient were reviewed by me and considered in my medical decision making (see chart for details).     26 yo female presents with flu like symptoms. She is outside the timeframe of when Tamiflu would be effective. Continue supportive care  with fluids, ibuprofen and acetaminophen. Seek immediate care if starting to have fevers, shaking or shortness of breath. F/u with PCP if symptoms fail to improve. Letter given for work excusing through Tuesday. May return sooner if feeling better. The pt voiced understanding and agreement to the plan.  Final Clinical Impressions(s) / UC Diagnoses   Final diagnoses:  Influenza-like illness     Shelda Pal, DO 12/18/16 1357

## 2016-12-18 NOTE — Discharge Instructions (Signed)
Continue to push fluids, practice good hand hygiene, and cover your mouth if you cough.  If you start having fevers, shaking or shortness of breath, seek immediate care.  OK to continue using Theraflu if it is helpful.  Lysol/disinfect items in your house routinely to help protect your children against germs.

## 2016-12-18 NOTE — ED Triage Notes (Signed)
C/O cough, sore throat, HA, body aches, nasal congestion x 3 days without fever.

## 2016-12-26 ENCOUNTER — Other Ambulatory Visit (HOSPITAL_COMMUNITY)
Admission: RE | Admit: 2016-12-26 | Discharge: 2016-12-26 | Disposition: A | Discharge status: Home / Self Care | Payer: Medicaid Other | Source: Ambulatory Visit | Attending: Obstetrics & Gynecology | Admitting: Obstetrics & Gynecology

## 2016-12-26 ENCOUNTER — Ambulatory Visit (INDEPENDENT_AMBULATORY_CARE_PROVIDER_SITE_OTHER): Payer: Medicaid Other | Admitting: Obstetrics & Gynecology

## 2016-12-26 ENCOUNTER — Encounter: Payer: Self-pay | Admitting: Obstetrics & Gynecology

## 2016-12-26 DIAGNOSIS — N898 Other specified noninflammatory disorders of vagina: Secondary | ICD-10-CM | POA: Insufficient documentation

## 2016-12-26 MED ORDER — METRONIDAZOLE 500 MG PO TABS: 500.0000 mg | ORAL_TABLET | Freq: Two times a day (BID) | ORAL | 0 refills | Status: AC

## 2016-12-26 NOTE — Patient Instructions (Signed)

## 2016-12-26 NOTE — Progress Notes (Signed)
Patient ID: Alexandria Fisher, female   DOB: February 05, 1991, 26 y.o.   MRN: [id number]  No chief complaint on file. vaginal discharge and odor  HPI Alexandria Fisher is a 25 y.o. female.  Patient's last menstrual period was 12/06/2016 (approximate). [id number] 2 weeks of vaginal discharge and odor similar to [place] diagnosed in the past. No pain or itching or abnormal bleeding   HPI  Past Medical History:  Diagnosis Date  . Chlamydia   . Chronic hypertension   . Depression   . 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 Hx MRSA infection 07/14/2011  . Infection     Past Surgical History:  Procedure Laterality Date  . DILATION AND EVACUATION N/A 09/19/2013   Procedure: DILATATION AND EVACUATION;  Surgeon: Guss Bunde, MD;  Location: [facility];  Service: Gynecology;  Laterality: N/A;    Family History  Problem Relation Age of Onset  . Breast cancer Maternal Grandmother   . Anemia Mother     Social History Social History  Substance Use Topics  . Smoking status: Former Smoker    Packs/day: 0.50    Types: Cigarettes    Quit date: 11/05/2015  . Smokeless tobacco: Former Systems developer  . Alcohol use No     Comment: former drinker    No Known Allergies  Current Outpatient Prescriptions  Medication Sig Dispense Refill  . acetaminophen (TYLENOL) 500 MG tablet Take 1,000 mg by mouth every 6 (six) hours as needed.    Marland Kitchen aspirin 81 MG chewable tablet Chew by mouth daily.    . hydrochlorothiazide (HYDRODIURIL) 25 MG tablet Take 1 tablet (25 mg total) by mouth daily. 30 tablet 3  . ibuprofen (ADVIL,MOTRIN) 600 MG tablet Take 1 tablet (600 mg total) by mouth every 6 (six) hours. 30 tablet 0  . Prenatal Multivit-Min-Fe-FA (PRENATAL VITAMINS) 0.8 MG tablet Take 1 tablet by mouth daily. 90 tablet 4  . metroNIDAZOLE (FLAGYL) 500 MG tablet Take 1 tablet (500 mg total) by mouth 2 (two) times daily. 14 tablet 0   No current facility-administered medications for this visit.     Review of Systems Review  of Systems  Constitutional: Negative.   Gastrointestinal: Negative.   Genitourinary: Positive for vaginal discharge. Negative for pelvic pain and vaginal bleeding.    Blood pressure (!) 146/84, pulse 99, height 5\' 6"  (1.676 m), weight 246 lb 11.2 oz (111.9 kg), last menstrual period 12/06/2016, not currently breastfeeding.  Physical Exam Physical Exam  Constitutional: She appears well-developed. No distress.  Genitourinary: Vaginal discharge (yellow white discharge) found.  Neurological: She is alert.  Psychiatric: She has a normal mood and affect. Her behavior is normal.    Data Reviewed   Assessment    Suspect BV    Plan    Probe sent to lab Flagyl 500 mg BID 7 days        Emeterio Reeve 12/26/2016, 3:35 PM

## 2016-12-27 LAB — CERVICOVAGINAL ANCILLARY ONLY
BACTERIAL VAGINITIS: POSITIVE — AB
CANDIDA VAGINITIS: NEGATIVE
TRICH (WINDOWPATH): NEGATIVE

## 2017-02-20 ENCOUNTER — Encounter (HOSPITAL_COMMUNITY): Payer: Self-pay | Admitting: Emergency Medicine

## 2017-02-20 ENCOUNTER — Emergency Department (HOSPITAL_COMMUNITY)
Admission: EM | Admit: 2017-02-20 | Discharge: 2017-02-20 | Disposition: A | Discharge status: Home / Self Care | Payer: Medicaid Other | Attending: Emergency Medicine | Admitting: Emergency Medicine

## 2017-02-20 ENCOUNTER — Emergency Department (HOSPITAL_COMMUNITY): Payer: Medicaid Other

## 2017-02-20 DIAGNOSIS — M546 Pain in thoracic spine: Secondary | ICD-10-CM | POA: Diagnosis not present

## 2017-02-20 DIAGNOSIS — Z79899 Other long term (current) drug therapy: Secondary | ICD-10-CM | POA: Insufficient documentation

## 2017-02-20 DIAGNOSIS — N898 Other specified noninflammatory disorders of vagina: Secondary | ICD-10-CM | POA: Insufficient documentation

## 2017-02-20 DIAGNOSIS — R0789 Other chest pain: Secondary | ICD-10-CM | POA: Diagnosis not present

## 2017-02-20 DIAGNOSIS — Z87891 Personal history of nicotine dependence: Secondary | ICD-10-CM | POA: Diagnosis not present

## 2017-02-20 DIAGNOSIS — Y999 Unspecified external cause status: Secondary | ICD-10-CM | POA: Diagnosis not present

## 2017-02-20 DIAGNOSIS — S3992XA Unspecified injury of lower back, initial encounter: Secondary | ICD-10-CM | POA: Diagnosis present

## 2017-02-20 DIAGNOSIS — Y939 Activity, unspecified: Secondary | ICD-10-CM | POA: Diagnosis not present

## 2017-02-20 DIAGNOSIS — Y92410 Unspecified street and highway as the place of occurrence of the external cause: Secondary | ICD-10-CM | POA: Insufficient documentation

## 2017-02-20 DIAGNOSIS — I10 Essential (primary) hypertension: Secondary | ICD-10-CM | POA: Diagnosis not present

## 2017-02-20 DIAGNOSIS — M545 Low back pain: Secondary | ICD-10-CM | POA: Insufficient documentation

## 2017-02-20 DIAGNOSIS — R300 Dysuria: Secondary | ICD-10-CM | POA: Insufficient documentation

## 2017-02-20 DIAGNOSIS — Z7982 Long term (current) use of aspirin: Secondary | ICD-10-CM | POA: Insufficient documentation

## 2017-02-20 LAB — URINALYSIS, ROUTINE W REFLEX MICROSCOPIC
Bilirubin Urine: NEGATIVE
Glucose, UA: NEGATIVE mg/dL
Hgb urine dipstick: NEGATIVE
Ketones, ur: NEGATIVE mg/dL
Leukocytes, UA: NEGATIVE
Nitrite: NEGATIVE
Protein, ur: NEGATIVE mg/dL
Specific Gravity, Urine: 1.006 (ref 1.005–1.030)
pH: 6 (ref 5.0–8.0)

## 2017-02-20 LAB — PREGNANCY, URINE: Preg Test, Ur: NEGATIVE

## 2017-02-20 MED ORDER — IBUPROFEN 600 MG PO TABS: 600.0000 mg | ORAL_TABLET | Freq: Four times a day (QID) | ORAL | 0 refills | Status: DC | PRN

## 2017-02-20 MED ORDER — METHOCARBAMOL 500 MG PO TABS
500.0000 mg | ORAL_TABLET | Freq: Two times a day (BID) | ORAL | 0 refills | Status: DC
Start: 2017-02-20 — End: 2018-08-30
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
## 2017-03-01 ENCOUNTER — Encounter (HOSPITAL_COMMUNITY): Payer: Self-pay

## 2017-03-01 ENCOUNTER — Emergency Department (HOSPITAL_COMMUNITY)
Admission: EM | Admit: 2017-03-01 | Discharge: 2017-03-01 | Disposition: A | Discharge status: Home / Self Care | Payer: Medicaid Other | Attending: Emergency Medicine | Admitting: Emergency Medicine

## 2017-03-01 DIAGNOSIS — Z79899 Other long term (current) drug therapy: Secondary | ICD-10-CM | POA: Insufficient documentation

## 2017-03-01 DIAGNOSIS — Y999 Unspecified external cause status: Secondary | ICD-10-CM | POA: Insufficient documentation

## 2017-03-01 DIAGNOSIS — Z87891 Personal history of nicotine dependence: Secondary | ICD-10-CM | POA: Insufficient documentation

## 2017-03-01 DIAGNOSIS — Z7982 Long term (current) use of aspirin: Secondary | ICD-10-CM | POA: Insufficient documentation

## 2017-03-01 DIAGNOSIS — Y92810 Car as the place of occurrence of the external cause: Secondary | ICD-10-CM | POA: Insufficient documentation

## 2017-03-01 DIAGNOSIS — W273XXA Contact with needle (sewing), initial encounter: Secondary | ICD-10-CM | POA: Diagnosis not present

## 2017-03-01 DIAGNOSIS — S71132A Puncture wound without foreign body, left thigh, initial encounter: Secondary | ICD-10-CM | POA: Insufficient documentation

## 2017-03-01 DIAGNOSIS — Y939 Activity, unspecified: Secondary | ICD-10-CM | POA: Diagnosis not present

## 2017-03-01 DIAGNOSIS — T148XXA Other injury of unspecified body region, initial encounter: Secondary | ICD-10-CM

## 2017-03-01 DIAGNOSIS — I10 Essential (primary) hypertension: Secondary | ICD-10-CM | POA: Insufficient documentation

## 2017-03-01 NOTE — Discharge Instructions (Signed)
Follow-up with your PCP in 2-3 weeks for further testing. Return to ED for fever, night sweats, weakness, numbness, trouble breathing, chest pain, trouble walking.

## 2017-03-01 NOTE — ED Notes (Signed)
Bed: WTR6 Expected date:  Expected time:  Means of arrival:  Comments: 
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
## 2017-03-09 IMAGING — US US OB COMP LESS 14 WK
1 series · 15 of 28 positions shown · non-contrast
Comparison: None.

CLINICAL DATA: 24-year-old pregnant female with pelvic pain for 2
weeks. Quantitative beta HCG of [DATE]. Unknown LMP.

EXAM:
OBSTETRIC <14 WK US AND TRANSVAGINAL OB US
TECHNIQUE: Both transabdominal and transvaginal ultrasound examinations were
performed for complete evaluation of the gestation as well as the
maternal uterus, adnexal regions, and pelvic cul-de-sac.
Transvaginal technique was performed to assess early pregnancy.

[Series 1: us ob comp less 14 wk · 15 of 63 slices shown]
[im 1/63]
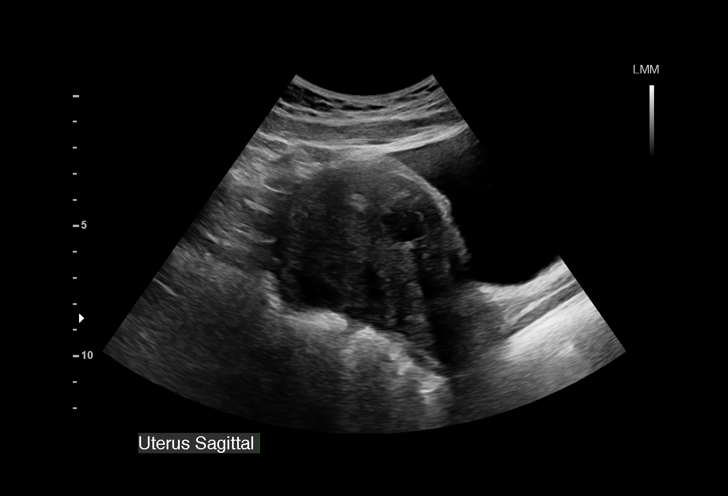
[im 5/63]
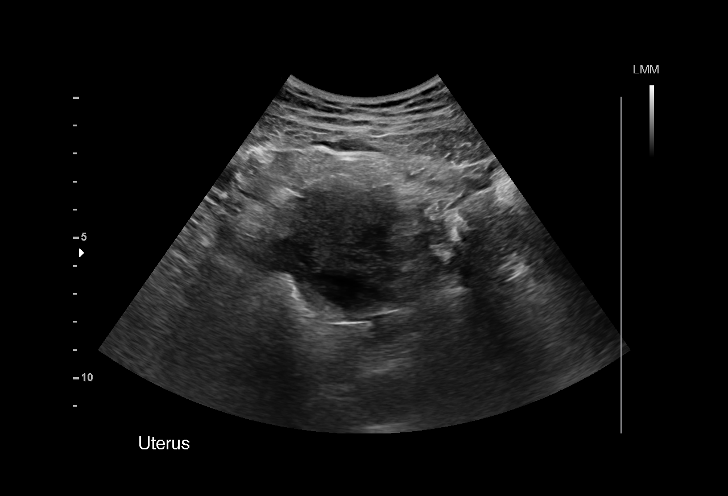
[im 10/63]
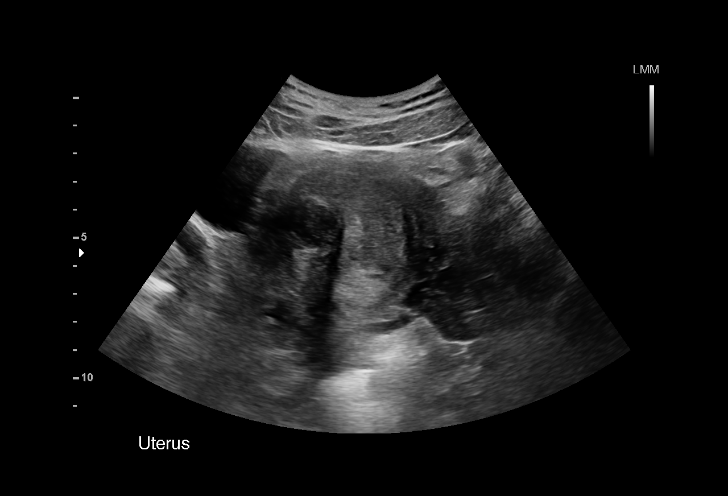
[im 14/63]
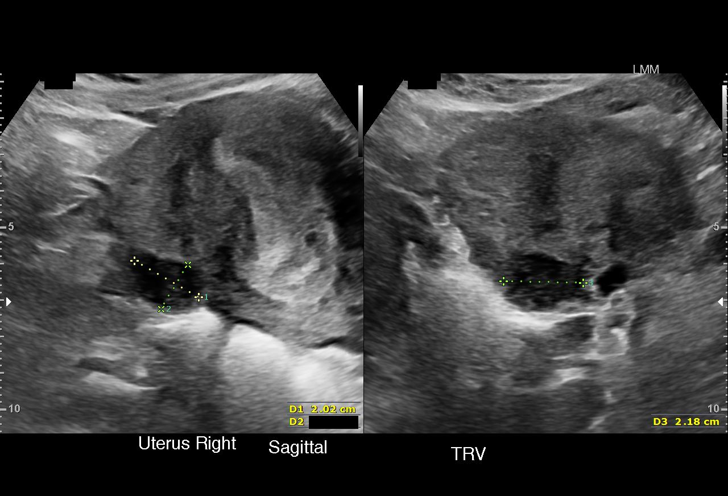
[im 19/63]
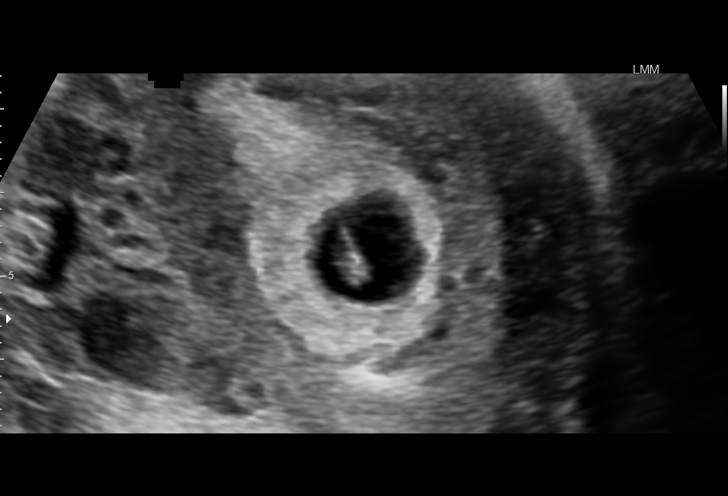
[im 23/63]
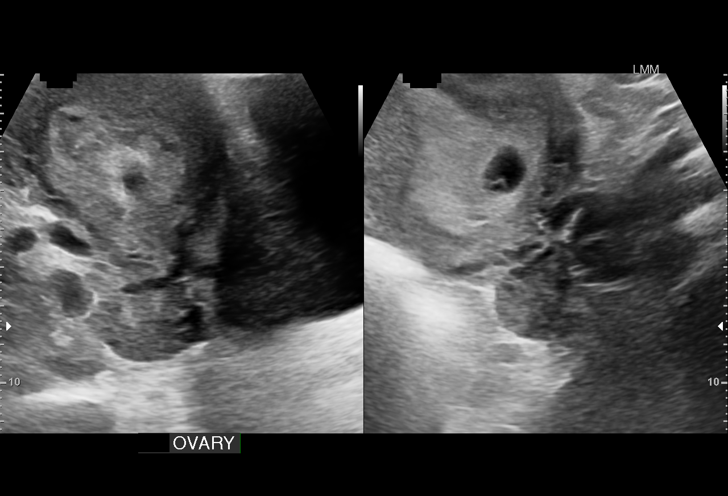
[im 28/63]
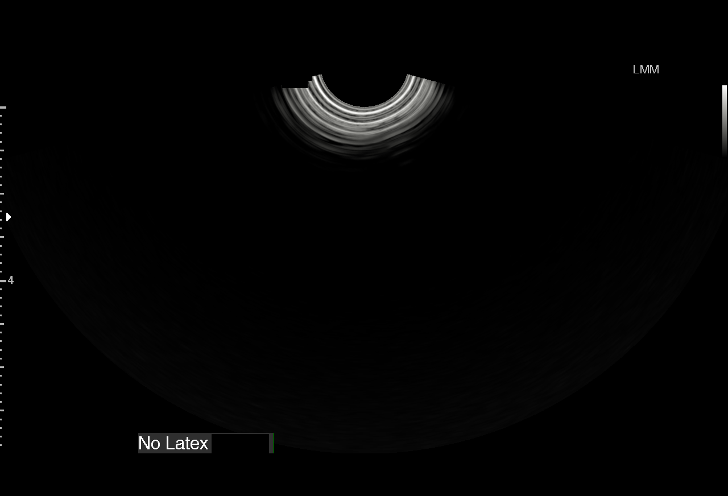
[im 33/63]
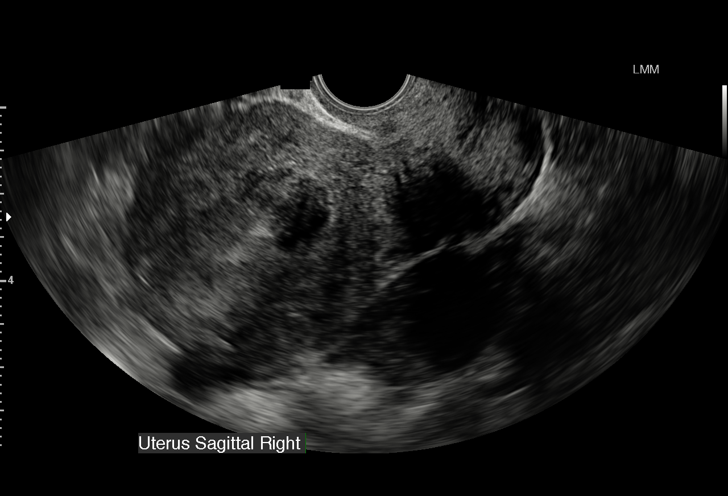
[im 35/63]
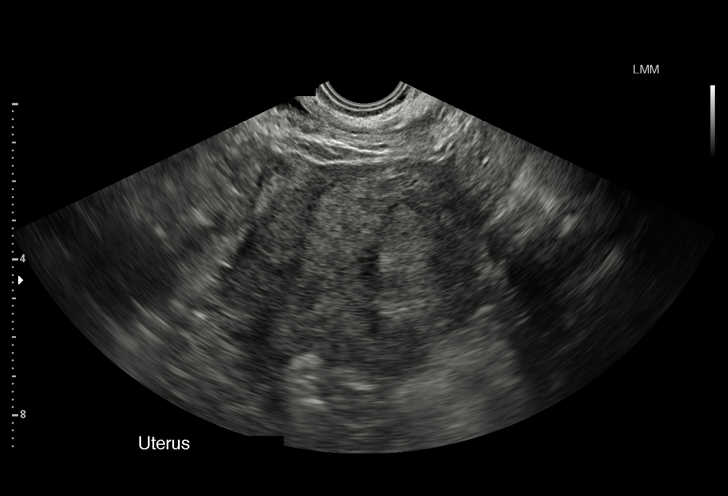
[im 40/63]
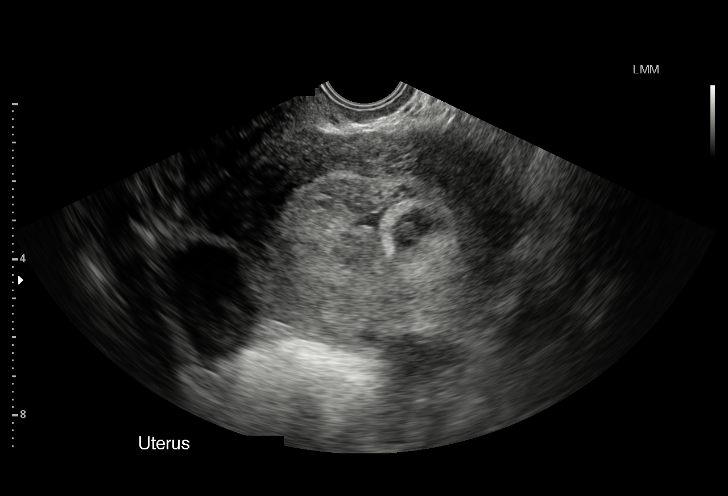
[im 44/63]
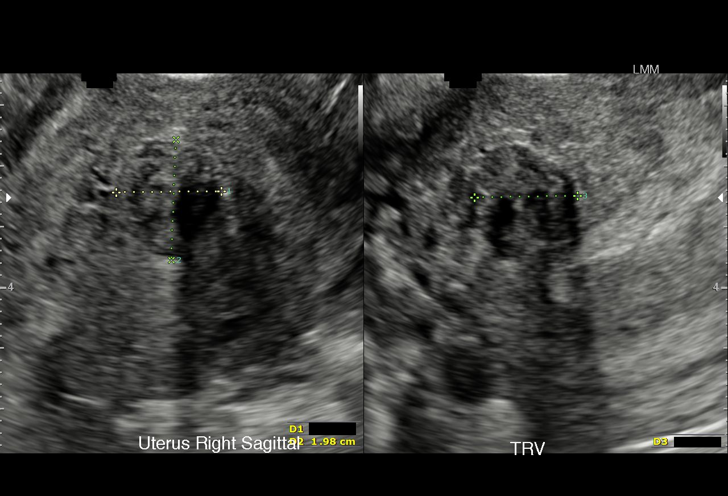
[im 49/63]
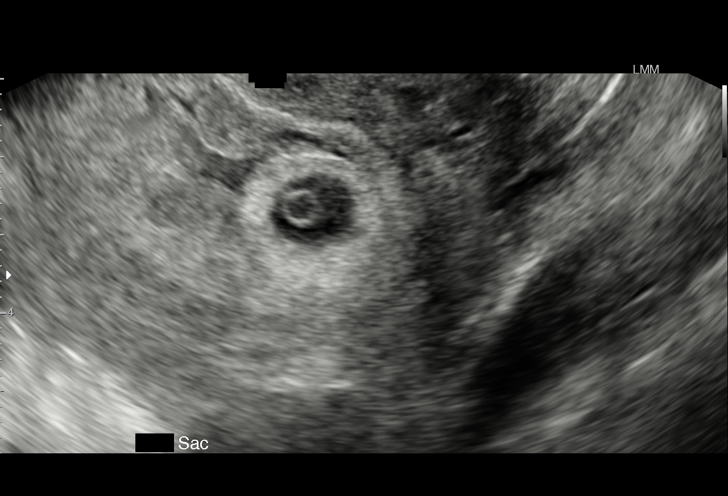
[im 53/63]
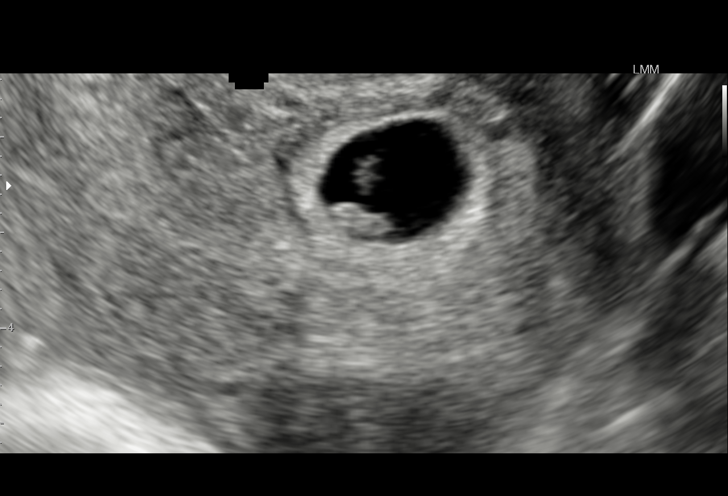
[im 58/63]
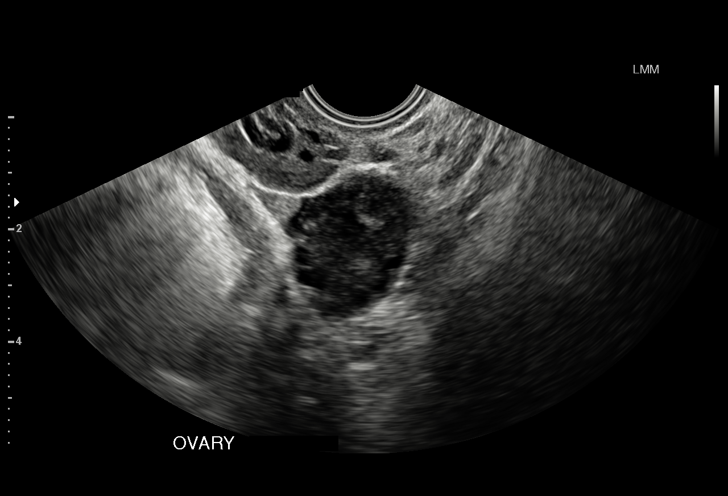
[im 63/63]
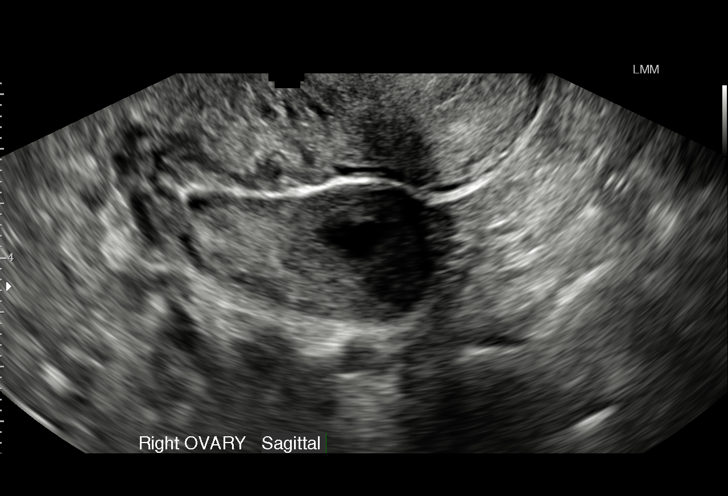

[15 of 28 positions shown; findings below may reference images not displayed]

FINDINGS: Intrauterine gestational sac: Visualized/normal in shape.

Yolk sac:  Visualized

Embryo:  Visualized

Cardiac Activity: Visualized

Heart Rate: 122  bpm

CRL:  6.6  mm   6 w   4 d                  US EDC: 07/13/2016

Maternal uterus/adnexae: There is no evidence of subchorionic
hemorrhage.

The ovaries bilaterally are unremarkable.

Two discrete uterine fibroids are identified as follows:

A 1.7 x 2 x 1.7 cm intramural right body fibroid.

A 2 x 1.5 x 2.3 cm sub serosal posterior body fibroid.
IMPRESSION: Single living intrauterine gestation with estimated gestational age
of 6 weeks 4 days by this ultrasound. No evidence of subchronic
hemorrhage.

Uterine fibroids as described.

## 2017-03-24 ENCOUNTER — Other Ambulatory Visit: Payer: Medicaid Other

## 2017-03-29 ENCOUNTER — Other Ambulatory Visit (HOSPITAL_COMMUNITY)
Admission: RE | Admit: 2017-03-29 | Discharge: 2017-03-29 | Disposition: A | Discharge status: Home / Self Care | Payer: Medicaid Other | Source: Ambulatory Visit | Attending: Obstetrics & Gynecology | Admitting: Obstetrics & Gynecology

## 2017-03-29 ENCOUNTER — Other Ambulatory Visit: Payer: Medicaid Other | Admitting: *Deleted

## 2017-03-29 DIAGNOSIS — N898 Other specified noninflammatory disorders of vagina: Secondary | ICD-10-CM

## 2017-03-29 DIAGNOSIS — Z7721 Contact with and (suspected) exposure to potentially hazardous body fluids: Principal | ICD-10-CM

## 2017-03-29 DIAGNOSIS — W461XXA Contact with contaminated hypodermic needle, initial encounter: Secondary | ICD-10-CM
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
## 2017-03-30 LAB — CERVICOVAGINAL ANCILLARY ONLY
Bacterial vaginitis: NEGATIVE
Candida vaginitis: NEGATIVE
Chlamydia: NEGATIVE
NEISSERIA GONORRHEA: NEGATIVE
TRICH (WINDOWPATH): NEGATIVE

## 2017-03-30 LAB — HIV ANTIBODY (ROUTINE TESTING W REFLEX): HIV SCREEN 4TH GENERATION: NONREACTIVE

## 2017-03-30 LAB — HEPATITIS B SURFACE ANTIGEN: Hepatitis B Surface Ag: NEGATIVE

## 2017-03-30 LAB — HEPATITIS C ANTIBODY

## 2017-05-23 ENCOUNTER — Other Ambulatory Visit: Payer: Self-pay | Admitting: Obstetrics and Gynecology

## 2017-07-24 ENCOUNTER — Encounter: Payer: Self-pay | Admitting: Medical

## 2017-07-24 ENCOUNTER — Ambulatory Visit: Payer: Self-pay | Admitting: Advanced Practice Midwife

## 2017-07-24 ENCOUNTER — Ambulatory Visit (INDEPENDENT_AMBULATORY_CARE_PROVIDER_SITE_OTHER): Payer: Self-pay | Admitting: Medical

## 2017-07-24 VITALS — BP 130/84 | HR 97 | Wt 234.5 lb

## 2017-07-24 DIAGNOSIS — Z202 Contact with and (suspected) exposure to infections with a predominantly sexual mode of transmission: Secondary | ICD-10-CM

## 2017-07-24 DIAGNOSIS — Z01419 Encounter for gynecological examination (general) (routine) without abnormal findings: Secondary | ICD-10-CM

## 2017-07-24 DIAGNOSIS — Z1389 Encounter for screening for other disorder: Secondary | ICD-10-CM

## 2017-07-24 NOTE — Patient Instructions (Signed)
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
## 2017-07-25 LAB — HEPATITIS B SURFACE ANTIBODY, QUANTITATIVE: Hepatitis B Surf Ab Quant: <3.1 m[IU]/mL — ABNORMAL LOW (ref >9.9)

## 2017-07-25 LAB — RPR: RPR: NONREACTIVE

## 2017-07-25 LAB — HIV ANTIBODY (ROUTINE TESTING W REFLEX): HIV Screen 4th Generation wRfx: NONREACTIVE

## 2017-07-25 LAB — HEPATITIS B SURFACE ANTIGEN: Hepatitis B Surface Ag: NEGATIVE

## 2017-07-25 LAB — HEPATITIS C ANTIBODY: Hep C Virus Ab: <0.1 s/co ratio (ref 0.0–0.9)

## 2017-07-26 LAB — CERVICOVAGINAL ANCILLARY ONLY
Bacterial vaginitis: POSITIVE — AB
Candida vaginitis: NEGATIVE
Chlamydia: NEGATIVE
Neisseria Gonorrhea: NEGATIVE
Trichomonas: NEGATIVE

## 2017-10-04 ENCOUNTER — Emergency Department (HOSPITAL_COMMUNITY)
Admission: EM | Admit: 2017-10-04 | Discharge: 2017-10-04 | Disposition: A | Discharge status: Home / Self Care | Payer: No Typology Code available for payment source | Attending: Emergency Medicine | Admitting: Emergency Medicine

## 2017-10-04 ENCOUNTER — Emergency Department (HOSPITAL_COMMUNITY): Payer: No Typology Code available for payment source

## 2017-10-04 ENCOUNTER — Encounter (HOSPITAL_COMMUNITY): Payer: Self-pay | Admitting: Family Medicine

## 2017-10-04 DIAGNOSIS — Y939 Activity, unspecified: Secondary | ICD-10-CM | POA: Insufficient documentation

## 2017-10-04 DIAGNOSIS — M62830 Muscle spasm of back: Secondary | ICD-10-CM | POA: Diagnosis not present

## 2017-10-04 DIAGNOSIS — Z7982 Long term (current) use of aspirin: Secondary | ICD-10-CM | POA: Insufficient documentation

## 2017-10-04 DIAGNOSIS — S199XXA Unspecified injury of neck, initial encounter: Secondary | ICD-10-CM | POA: Diagnosis present

## 2017-10-04 DIAGNOSIS — Y92410 Unspecified street and highway as the place of occurrence of the external cause: Secondary | ICD-10-CM | POA: Insufficient documentation

## 2017-10-04 DIAGNOSIS — Y999 Unspecified external cause status: Secondary | ICD-10-CM | POA: Diagnosis not present

## 2017-10-04 DIAGNOSIS — I10 Essential (primary) hypertension: Secondary | ICD-10-CM | POA: Insufficient documentation

## 2017-10-04 DIAGNOSIS — M542 Cervicalgia: Secondary | ICD-10-CM | POA: Diagnosis not present

## 2017-10-04 DIAGNOSIS — Z87891 Personal history of nicotine dependence: Secondary | ICD-10-CM | POA: Insufficient documentation

## 2017-10-04 DIAGNOSIS — Z79899 Other long term (current) drug therapy: Secondary | ICD-10-CM | POA: Diagnosis not present

## 2017-10-04 MED ORDER — CYCLOBENZAPRINE HCL 10 MG PO TABS: 10.0000 mg | ORAL_TABLET | Freq: Two times a day (BID) | ORAL | 0 refills | Status: DC | PRN

## 2017-10-04 MED ORDER — DICLOFENAC SODIUM 50 MG PO TBEC: 50.0000 mg | DELAYED_RELEASE_TABLET | Freq: Two times a day (BID) | ORAL | 0 refills | Status: DC

## 2017-10-04 NOTE — Discharge Instructions (Signed)
Do not take the muscle relaxer if driving as it will make you sleepy. Follow up with your doctor.  Return here as needed for worsening symptoms.

## 2017-10-04 NOTE — ED Notes (Signed)
Pt verbalizes understanding of d/c paperwork, follow up instructions, and medications. Pt A/O x4, ambulatory. All belongings with patient upon departure.  

## 2017-10-04 NOTE — ED Notes (Signed)
Bed: WTR7 Expected date:  Expected time:  Means of arrival:  Comments: 
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
## 2017-12-11 ENCOUNTER — Encounter (HOSPITAL_COMMUNITY): Payer: Self-pay | Admitting: Emergency Medicine

## 2017-12-11 ENCOUNTER — Ambulatory Visit (HOSPITAL_COMMUNITY)
Admission: EM | Admit: 2017-12-11 | Discharge: 2017-12-11 | Disposition: A | Discharge status: Home / Self Care | Payer: Medicaid Other | Attending: Emergency Medicine | Admitting: Emergency Medicine

## 2017-12-11 DIAGNOSIS — J020 Streptococcal pharyngitis: Secondary | ICD-10-CM | POA: Diagnosis not present

## 2017-12-11 LAB — POCT RAPID STREP A: Streptococcus, Group A Screen (Direct): POSITIVE — AB

## 2017-12-11 MED ORDER — AMOXICILLIN 500 MG PO CAPS: 500.0000 mg | ORAL_CAPSULE | Freq: Two times a day (BID) | ORAL | 0 refills | Status: AC

## 2017-12-11 NOTE — ED Triage Notes (Signed)
Pt sts sore throat and cough x 3 days; pt sts some nausea today

## 2017-12-11 NOTE — ED Provider Notes (Signed)
[place]    CSN: [id number] Arrival date & time: 12/11/17  1246     History   Chief Complaint Chief Complaint  Patient presents with  . Sore Throat  . Cough    HPI Alexandria Fisher is a 27 y.o. female.   Alexandria Fisher presents with complaints of body aches, chills, headache, upset stomach, congestion and sore throat which started two days ago. Without vomiting. She has had loose stool stool. No known ill contacts. Has been taking nyquil, mucinex, theraflu and tylenol which have helped minimally. Rates pain 7/10 to throat. History of htn.    ROS per HPI.       Past Medical History:  Diagnosis Date  . Chlamydia   . Chronic hypertension   . Depression   . 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 Hx MRSA infection 07/14/2011  . Infection     Patient Active Problem List   Diagnosis Date Noted  . 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 Hypertension 10/24/2013    Past Surgical History:  Procedure Laterality Date  . DILATION AND EVACUATION N/A 09/19/2013   Procedure: DILATATION AND EVACUATION;  Surgeon: Guss Bunde, MD;  Location: [facility];  Service: Gynecology;  Laterality: N/A;    OB History    Gravida Para Term Preterm AB Living   5 3 3   2 3    SAB TAB Ectopic Multiple Live Births   2     0 3       Home Medications    Prior to Admission medications   Medication Sig Start Date End Date Taking? Authorizing Provider  acetaminophen (TYLENOL) 500 MG tablet Take 1,000 mg by mouth every 6 (six) hours as needed.    [provider]  amoxicillin (AMOXIL) 500 MG capsule Take 1 capsule (500 mg total) by mouth 2 (two) times daily for 10 days. 12/11/17 12/21/17  Zigmund Gottron, NP  aspirin 81 MG chewable tablet Chew by mouth daily.    [provider]  cyclobenzaprine (FLEXERIL) 10 MG tablet Take 1 tablet (10 mg total) by mouth 2 (two) times daily as needed for muscle spasms. 10/04/17   Ashley Murrain, NP  diclofenac (VOLTAREN) 50 MG EC  tablet Take 1 tablet (50 mg total) by mouth 2 (two) times daily. 10/04/17   Ashley Murrain, NP  hydrochlorothiazide (HYDRODIURIL) 25 MG tablet Take 1 tablet (25 mg total) by mouth daily. 07/21/16   Lavonia Drafts, MD  ibuprofen (ADVIL,MOTRIN) 600 MG tablet Take 1 tablet (600 mg total) by mouth every 6 (six) hours as needed. 02/20/17   Law, Bea Graff, PA-C  methocarbamol (ROBAXIN) 500 MG tablet Take 1 tablet (500 mg total) by mouth 2 (two) times daily. 02/20/17   Frederica Kuster, PA-C  Prenatal Multivit-Min-Fe-FA (PRENATAL VITAMINS) 0.8 MG tablet Take 1 tablet by mouth daily. 12/09/15   Wouk, Ailene Rud, MD  Prenatal Vit-Fe Fumarate-FA (PNV PRENATAL PLUS MULTIVITAMIN) 27-1 MG TABS TAKE ONE TABLET BY MOUTH EVERY DAY 05/27/17   Wouk, Ailene Rud, MD    Family History Family History  Problem Relation Age of Onset  . Breast cancer Maternal Grandmother   . Anemia Mother   . Hypertension Mother     Social History Social History   Tobacco Use  . Smoking status: Former Smoker    Packs/day: 0.50    Types: Cigarettes    Last attempt to quit: 11/05/2015    Years since quitting: 2.1  . Smokeless tobacco: Former Systems developer  Substance Use Topics  . Alcohol use: No    Frequency: Never  . Drug use: No     Allergies   Patient has no known allergies.   Review of Systems Review of Systems   Physical Exam Triage Vital Signs ED Triage Vitals [12/11/17 1349]  Enc Vitals Group     BP (!) 142/88     Pulse Rate (!) 110     Resp 18     Temp 98.3 F (36.8 C)     Temp Source Oral     SpO2 100 %     Weight      Height      Head Circumference      Peak Flow      Pain Score 8     Pain Loc      Pain Edu?      Excl. in [place]?    No data found.  Updated Vital Signs BP (!) 142/88 (BP Location: Right Arm)   Pulse (!) 110   Temp 98.3 F (36.8 C) (Oral)   Resp 18   SpO2 100%   Visual Acuity Right Eye Distance:   Left Eye Distance:   Bilateral Distance:    Right Eye Near:   Left  Eye Near:    Bilateral Near:     Physical Exam  Constitutional: She is oriented to person, place, and time. She appears well-developed and well-nourished. No distress.  HENT:  Head: Normocephalic and atraumatic.  Right Ear: Tympanic membrane, external ear and ear canal normal.  Left Ear: Tympanic membrane, external ear and ear canal normal.  Nose: Nose normal.  Mouth/Throat: Uvula is midline, oropharynx is clear and moist and mucous membranes are normal. Tonsils are 2+ on the right. Tonsils are 2+ on the left. Tonsillar exudate.  Eyes: Conjunctivae and EOM are normal. Pupils are equal, round, and reactive to light.  Cardiovascular: Normal rate, regular rhythm and normal heart sounds.  Pulmonary/Chest: Effort normal and breath sounds normal.  Lymphadenopathy:    She has cervical adenopathy.  Neurological: She is alert and oriented to person, place, and time.  Skin: Skin is warm and dry.     UC Treatments / Results  Labs (all labs ordered are listed, but only abnormal results are displayed) Labs Reviewed  POCT RAPID STREP A - Abnormal; Notable for the following components:      Result Value   Streptococcus, Group A Screen (Direct) POSITIVE (*)    All other components within normal limits    EKG  EKG Interpretation None       Radiology No results found.  Procedures Procedures (including critical care time)  Medications Ordered in UC Medications - No data to display   Initial Impression / Assessment and Plan / UC Course  I have reviewed the triage vital signs and the nursing notes.  Pertinent labs & imaging results that were available during my care of the patient were reviewed by me and considered in my medical decision making (see chart for details).     Positive rapid strep. Amoxicillin bid x10 days. Supportive cares for symptoms. Return precautions provided. Patient verbalized understanding and agreeable to plan.    Final Clinical Impressions(s) / UC Diagnoses    Final diagnoses:  Strep pharyngitis    ED Discharge Orders        Ordered    amoxicillin (AMOXIL) 500 MG capsule  2 times daily     12/11/17 1449       Controlled Substance  Prescriptions  Controlled Substance Registry consulted? Not Applicable   Zigmund Gottron, NP 12/11/17 1452

## 2017-12-11 NOTE — Discharge Instructions (Signed)
Push fluids to ensure adequate hydration and keep secretions thin.  Tylenol and/or ibuprofen as needed for pain or fevers.  Complete course of antibiotics.  Considered contagious for the next 24 hours. Change out toothbrush in 24 hours. If symptoms worsen or do not improve in the next week to return to be seen or to follow up with PCP.

## 2018-01-02 ENCOUNTER — Ambulatory Visit (HOSPITAL_COMMUNITY)
Admission: EM | Admit: 2018-01-02 | Discharge: 2018-01-02 | Disposition: A | Discharge status: Home / Self Care | Payer: Medicaid Other | Attending: Family Medicine | Admitting: Family Medicine

## 2018-01-02 ENCOUNTER — Other Ambulatory Visit: Payer: Self-pay

## 2018-01-02 ENCOUNTER — Encounter (HOSPITAL_COMMUNITY): Payer: Self-pay | Admitting: Emergency Medicine

## 2018-01-02 DIAGNOSIS — R0981 Nasal congestion: Secondary | ICD-10-CM | POA: Diagnosis not present

## 2018-01-02 DIAGNOSIS — J020 Streptococcal pharyngitis: Secondary | ICD-10-CM

## 2018-01-02 LAB — POCT RAPID STREP A: Streptococcus, Group A Screen (Direct): POSITIVE — AB

## 2018-01-02 MED ORDER — PENICILLIN G BENZATHINE 1200000 UNIT/2ML IM SUSP
1.2000 10*6.[IU] | Freq: Once | INTRAMUSCULAR | Status: AC
  Administered 2018-01-02: 1.2 10*6.[IU] via INTRAMUSCULAR

## 2018-01-02 MED ORDER — FLUTICASONE PROPIONATE 50 MCG/ACT NA SUSP: 2.0000 | Freq: Every day | NASAL | 0 refills | Status: DC

## 2018-01-02 MED ORDER — PENICILLIN G BENZATHINE 1200000 UNIT/2ML IM SUSP
INTRAMUSCULAR | Status: AC
  Filled 2018-01-02: qty 2

## 2018-01-02 MED ORDER — IPRATROPIUM BROMIDE 0.06 % NA SOLN: 2.0000 | Freq: Four times a day (QID) | NASAL | 0 refills | Status: DC

## 2018-01-02 MED ORDER — MELOXICAM 7.5 MG PO TABS: 7.5000 mg | ORAL_TABLET | Freq: Every day | ORAL | 0 refills | Status: DC

## 2018-01-02 NOTE — Discharge Instructions (Signed)
Rapid strep positive.  Bicillin injection in office today.  As discussed, strep does not usually cause nasal congestion, you can have a virus on top of the strep.  Mobic for headache. Start Flonase, Atrovent nasal spray for nasal congestion/drainage. You can use over the counter nasal saline rinse such as neti pot for nasal congestion. Keep hydrated, your urine should be clear to pale yellow in color. Tylenol for fever and pain. Monitor for any worsening of symptoms, chest pain, shortness of breath, wheezing, swelling of the throat, follow up for reevaluation.   For sore throat try using a honey-based tea. Use 3 teaspoons of honey with juice squeezed from half lemon. Place shaved pieces of ginger into 1/2-1 cup of water and warm over stove top. Then mix the ingredients and repeat every 4 hours as needed.

## 2018-01-02 NOTE — ED Triage Notes (Signed)
Pt reports sore throat, nasal congestion, body aches and headache for a few days.  Pt was seen on March 4 for similar symptoms.  She states she got better but then her sister, whom she lives with, got sick and she started feeling bad again.

## 2018-01-02 NOTE — ED Provider Notes (Signed)
[place]    CSN: [id number] Arrival date & time: 01/02/18  1100     History   Chief Complaint Chief Complaint  Patient presents with  . URI    HPI Alexandria Fisher is a 27 y.o. female.   27 year old female comes in 4 days of URI symptoms. Has had body aches, headaches, nasal congestion, sore throat, cough. States woke up the past few days worsening nasal congestion and sore throat and came in. Denies fever, chills, night sweats. otc nyquil, mucinex with some relief. Former smoker, 10 years, 3-4 cigs/day. Denies inhaler use.      Past Medical History:  Diagnosis Date  . Chlamydia   . Chronic hypertension   . Depression   . 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 Hx MRSA infection 07/14/2011  . Infection     Patient Active Problem List   Diagnosis Date Noted  . 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 Hypertension 10/24/2013    Past Surgical History:  Procedure Laterality Date  . DILATION AND EVACUATION N/A 09/19/2013   Procedure: DILATATION AND EVACUATION;  Surgeon: Guss Bunde, MD;  Location: [facility];  Service: Gynecology;  Laterality: N/A;    OB History    Gravida  5   Para  3   Term  3   Preterm      AB  2   Living  3     SAB  2   TAB      Ectopic      Multiple  0   Live Births  3            Home Medications    Prior to Admission medications   Medication Sig Start Date End Date Taking? Authorizing Provider  aspirin 81 MG chewable tablet Chew by mouth daily.   Yes [provider]  hydrochlorothiazide (HYDRODIURIL) 25 MG tablet Take 1 tablet (25 mg total) by mouth daily. 07/21/16  Yes Lavonia Drafts, MD  acetaminophen (TYLENOL) 500 MG tablet Take 1,000 mg by mouth every 6 (six) hours as needed.    [provider]  cyclobenzaprine (FLEXERIL) 10 MG tablet Take 1 tablet (10 mg total) by mouth 2 (two) times daily as needed for muscle spasms. 10/04/17   Ashley Murrain, NP  diclofenac  (VOLTAREN) 50 MG EC tablet Take 1 tablet (50 mg total) by mouth 2 (two) times daily. 10/04/17   Ashley Murrain, NP  fluticasone (FLONASE) 50 MCG/ACT nasal spray Place 2 sprays into both nostrils daily. 01/02/18   Tasia Catchings, Arlynn Stare V, PA-C  ibuprofen (ADVIL,MOTRIN) 600 MG tablet Take 1 tablet (600 mg total) by mouth every 6 (six) hours as needed. 02/20/17   Law, Bea Graff, PA-C  ipratropium (ATROVENT) 0.06 % nasal spray Place 2 sprays into both nostrils 4 (four) times daily. 01/02/18   Tasia Catchings, Ahmad Vanwey V, PA-C  meloxicam (MOBIC) 7.5 MG tablet Take 1 tablet (7.5 mg total) by mouth daily. 01/02/18   Tasia Catchings, Rosalba Totty V, PA-C  methocarbamol (ROBAXIN) 500 MG tablet Take 1 tablet (500 mg total) by mouth 2 (two) times daily. 02/20/17   Frederica Kuster, PA-C  Prenatal Multivit-Min-Fe-FA (PRENATAL VITAMINS) 0.8 MG tablet Take 1 tablet by mouth daily. 12/09/15   Wouk, Ailene Rud, MD  Prenatal Vit-Fe Fumarate-FA (PNV PRENATAL PLUS MULTIVITAMIN) 27-1 MG TABS TAKE ONE TABLET BY MOUTH EVERY DAY 05/27/17   Wouk, Ailene Rud, MD    Family History Family History  Problem Relation Age of Onset  .  Breast cancer Maternal Grandmother   . Anemia Mother   . Hypertension Mother     Social History Social History   Tobacco Use  . Smoking status: Former Smoker    Packs/day: 0.50    Types: Cigarettes    Last attempt to quit: 11/05/2015    Years since quitting: 2.1  . Smokeless tobacco: Former Network engineer Use Topics  . Alcohol use: No    Frequency: Never  . Drug use: No     Allergies   Patient has no known allergies.   Review of Systems Review of Systems  Reason unable to perform ROS: See HPI as above.     Physical Exam Triage Vital Signs ED Triage Vitals  Enc Vitals Group     BP 01/02/18 1120 126/85     Pulse Rate 01/02/18 1120 (!) 113     Resp --      Temp 01/02/18 1120 98.4 F (36.9 C)     Temp Source 01/02/18 1120 Oral     SpO2 01/02/18 1120 98 %     Weight --      Height --      Head Circumference --      Peak  Flow --      Pain Score 01/02/18 1118 6     Pain Loc --      Pain Edu? --      Excl. in [place]? --    No data found.  Updated Vital Signs BP 126/85 (BP Location: Right Arm)   Pulse (!) 113   Temp 98.4 F (36.9 C) (Oral)   LMP 12/17/2017 (Approximate)   SpO2 98%   Physical Exam  Constitutional: She is oriented to person, place, and time. She appears well-developed and well-nourished. No distress.  HENT:  Head: Normocephalic and atraumatic.  Right Ear: Tympanic membrane, external ear and ear canal normal. Tympanic membrane is not erythematous and not bulging.  Left Ear: Tympanic membrane, external ear and ear canal normal. Tympanic membrane is not erythematous and not bulging.  Nose: Mucosal edema and rhinorrhea present. Right sinus exhibits no maxillary sinus tenderness and no frontal sinus tenderness. Left sinus exhibits no maxillary sinus tenderness and no frontal sinus tenderness.  Mouth/Throat: Uvula is midline and mucous membranes are normal. Posterior oropharyngeal erythema present. Tonsils are 2+ on the right. Tonsils are 2+ on the left.  Eyes: Pupils are equal, round, and reactive to light. Conjunctivae are normal.  Neck: Normal range of motion. Neck supple.  Cardiovascular: Normal rate, regular rhythm and normal heart sounds. Exam reveals no gallop and no friction rub.  No murmur heard. Pulmonary/Chest: Effort normal and breath sounds normal. She has no decreased breath sounds. She has no wheezes. She has no rhonchi. She has no rales.  Lymphadenopathy:    She has no cervical adenopathy.  Neurological: She is alert and oriented to person, place, and time.  Skin: Skin is warm and dry.  Psychiatric: She has a normal mood and affect. Her behavior is normal. Judgment normal.     UC Treatments / Results  Labs (all labs ordered are listed, but only abnormal results are displayed) Labs Reviewed  POCT RAPID STREP A - Abnormal; Notable for the following components:      Result  Value   Streptococcus, Group A Screen (Direct) POSITIVE (*)    All other components within normal limits    EKG None Radiology No results found.  Procedures Procedures (including critical care time)  Medications Ordered in  UC Medications  penicillin g benzathine (BICILLIN LA) 1200000 UNIT/2ML injection 1.2 Million Units (1.2 Million Units Intramuscular Given 01/02/18 1215)     Initial Impression / Assessment and Plan / UC Course  I have reviewed the triage vital signs and the nursing notes.  Pertinent labs & imaging results that were available during my care of the patient were reviewed by me and considered in my medical decision making (see chart for details).    Rapid strep positive. Start antibiotic as directed. Symptomatic treatment as needed. Return precautions given.   Final Clinical Impressions(s) / UC Diagnoses   Final diagnoses:  Strep pharyngitis  Nasal congestion    ED Discharge Orders        Ordered    fluticasone (FLONASE) 50 MCG/ACT nasal spray  Daily     01/02/18 1207    ipratropium (ATROVENT) 0.06 % nasal spray  4 times daily     01/02/18 1207    meloxicam (MOBIC) 7.5 MG tablet  Daily     01/02/18 1207        Ok Edwards, PA-C 01/02/18 1219

## 2018-01-09 ENCOUNTER — Encounter: Payer: Self-pay | Admitting: *Deleted

## 2018-02-13 ENCOUNTER — Ambulatory Visit (INDEPENDENT_AMBULATORY_CARE_PROVIDER_SITE_OTHER): Payer: Medicaid Other

## 2018-02-13 ENCOUNTER — Other Ambulatory Visit (HOSPITAL_COMMUNITY)
Admission: RE | Admit: 2018-02-13 | Discharge: 2018-02-13 | Disposition: A | Discharge status: Home / Self Care | Payer: Medicaid Other | Source: Ambulatory Visit | Attending: Family Medicine | Admitting: Family Medicine

## 2018-02-13 DIAGNOSIS — B9689 Other specified bacterial agents as the cause of diseases classified elsewhere: Secondary | ICD-10-CM | POA: Insufficient documentation

## 2018-02-13 DIAGNOSIS — Z202 Contact with and (suspected) exposure to infections with a predominantly sexual mode of transmission: Secondary | ICD-10-CM

## 2018-02-13 DIAGNOSIS — N898 Other specified noninflammatory disorders of vagina: Secondary | ICD-10-CM | POA: Insufficient documentation

## 2018-02-13 DIAGNOSIS — B373 Candidiasis of vulva and vagina: Secondary | ICD-10-CM | POA: Diagnosis not present

## 2018-02-13 NOTE — Progress Notes (Addendum)
Pt here today questionable bacterial infection and pt requesting STD screening.  Pt was explained to self-swab and blood draw for HIV, Hep B/C, and RPR completed today.     I have reviewed this note and agree with the plan of care.  Earlie Server, RN, MSN, NP-BC Nurse Practitioner, [facility] for [employer], [facility] 02/13/2018 5:37 PM

## 2018-02-14 LAB — CERVICOVAGINAL ANCILLARY ONLY
BACTERIAL VAGINITIS: POSITIVE — AB
CHLAMYDIA, DNA PROBE: NEGATIVE
Candida vaginitis: POSITIVE — AB
Neisseria Gonorrhea: NEGATIVE
Trichomonas: NEGATIVE

## 2018-02-14 LAB — RPR: RPR Ser Ql: NONREACTIVE

## 2018-02-14 LAB — HEPATITIS B SURFACE ANTIGEN: HEP B S AG: NEGATIVE

## 2018-02-14 LAB — HIV ANTIBODY (ROUTINE TESTING W REFLEX): HIV Screen 4th Generation wRfx: NONREACTIVE

## 2018-02-14 LAB — HEPATITIS C ANTIBODY

## 2018-02-15 ENCOUNTER — Other Ambulatory Visit: Payer: Self-pay | Admitting: Nurse Practitioner

## 2018-02-15 MED ORDER — METRONIDAZOLE 500 MG PO TABS: 500.0000 mg | ORAL_TABLET | Freq: Two times a day (BID) | ORAL | 0 refills | Status: DC

## 2018-02-15 MED ORDER — FLUCONAZOLE 150 MG PO TABS: 150.0000 mg | ORAL_TABLET | Freq: Every day | ORAL | 1 refills | Status: DC

## 2018-02-15 NOTE — Progress Notes (Signed)
Vaginal swab shows BV and yeast.  Metronidazole and diflucan sent to client's pharmacy.  Message sent to clinical staff to call client.  Earlie Server, RN, MSN, NP-BC Nurse Practitioner, [facility] for [employer], [facility] 02/15/2018 9:29 AM

## 2018-02-19 ENCOUNTER — Telehealth: Payer: Self-pay

## 2018-02-19 NOTE — Telephone Encounter (Signed)
Called pt with results. Per Earlie Server, labs show BV and yeast. Will send medication to client's pharmacy and have staff call her to give results. Needs to take metronidazole first and then take yeast medication.Pt verbalized understanding and had no questions.

## 2018-02-21 ENCOUNTER — Other Ambulatory Visit: Payer: Self-pay | Admitting: Nurse Practitioner

## 2018-05-28 ENCOUNTER — Other Ambulatory Visit: Payer: Self-pay | Admitting: Obstetrics and Gynecology

## 2018-06-06 ENCOUNTER — Other Ambulatory Visit: Payer: Self-pay | Admitting: Obstetrics and Gynecology

## 2018-06-22 ENCOUNTER — Ambulatory Visit: Payer: Medicaid Other | Attending: Family Medicine | Admitting: Family Medicine

## 2018-06-22 ENCOUNTER — Other Ambulatory Visit: Payer: Self-pay

## 2018-06-22 ENCOUNTER — Encounter: Payer: Self-pay | Admitting: Family Medicine

## 2018-06-22 VITALS — BP 138/90 | HR 91 | Temp 98.2°F | Resp 20 | Ht 68.0 in | Wt 264.0 lb

## 2018-06-22 DIAGNOSIS — R05 Cough: Secondary | ICD-10-CM | POA: Insufficient documentation

## 2018-06-22 DIAGNOSIS — R0981 Nasal congestion: Secondary | ICD-10-CM | POA: Diagnosis not present

## 2018-06-22 DIAGNOSIS — J351 Hypertrophy of tonsils: Secondary | ICD-10-CM

## 2018-06-22 DIAGNOSIS — Z87891 Personal history of nicotine dependence: Secondary | ICD-10-CM | POA: Insufficient documentation

## 2018-06-22 DIAGNOSIS — J069 Acute upper respiratory infection, unspecified: Secondary | ICD-10-CM

## 2018-06-22 DIAGNOSIS — F329 Major depressive disorder, single episode, unspecified: Secondary | ICD-10-CM | POA: Insufficient documentation

## 2018-06-22 DIAGNOSIS — I10 Essential (primary) hypertension: Secondary | ICD-10-CM

## 2018-06-22 MED ORDER — AMOXICILLIN 500 MG PO CAPS: 500.0000 mg | ORAL_CAPSULE | Freq: Two times a day (BID) | ORAL | 0 refills | Status: DC

## 2018-06-22 MED ORDER — CETIRIZINE HCL 10 MG PO TABS: 10.0000 mg | ORAL_TABLET | Freq: Every day | ORAL | 5 refills | Status: DC

## 2018-06-22 MED ORDER — HYDROCHLOROTHIAZIDE 25 MG PO TABS: 25.0000 mg | ORAL_TABLET | Freq: Every day | ORAL | 3 refills | Status: DC

## 2018-06-22 NOTE — Patient Instructions (Signed)
Upper Respiratory Infection, Adult Most upper respiratory infections (URIs) are caused by a virus. A URI affects the nose, throat, and upper air passages. The most common type of URI is often called "the common cold." Follow these instructions at home:  Take medicines only as told by your doctor.  Gargle warm saltwater or take cough drops to comfort your throat as told by your doctor.  Use a warm mist humidifier or inhale steam from a shower to increase air moisture. This may make it easier to breathe.  Drink enough fluid to keep your pee (urine) clear or pale yellow.  Eat soups and other clear broths.  Have a healthy diet.  Rest as needed.  Go back to work when your fever is gone or your doctor says it is okay. ? You may need to stay home longer to avoid giving your URI to others. ? You can also wear a face mask and wash your hands often to prevent spread of the virus.  Use your inhaler more if you have asthma.  Do not use any tobacco products, including cigarettes, chewing tobacco, or electronic cigarettes. If you need help quitting, ask your doctor. Contact a doctor if:  You are getting worse, not better.  Your symptoms are not helped by medicine.  You have chills.  You are getting more short of breath.  You have brown or red mucus.  You have yellow or brown discharge from your nose.  You have pain in your face, especially when you bend forward.  You have a fever.  You have puffy (swollen) neck glands.  You have pain while swallowing.  You have white areas in the back of your throat. Get help right away if:  You have very bad or constant: ? Headache. ? Ear pain. ? Pain in your forehead, behind your eyes, and over your cheekbones (sinus pain). ? Chest pain.  You have long-lasting (chronic) lung disease and any of the following: ? Wheezing. ? Long-lasting cough. ? Coughing up blood. ? A change in your usual mucus.  You have a stiff neck.  You have  changes in your: ? Vision. ? Hearing. ? Thinking. ? Mood. This information is not intended to replace advice given to you by your health care provider. Make sure you discuss any questions you have with your health care provider. Document Released: 03/14/2008 Document Revised: 05/29/2016 Document Reviewed: 01/01/2014 Elsevier Interactive Patient Education  2018 Elsevier Inc.  

## 2018-06-22 NOTE — Progress Notes (Signed)
Subjective:    Patient ID: Alexandria Fisher, female    DOB: October 20, 1990, 27 y.o.   MRN: [id number]  HPI 27 year old female (patient has been seen here in the past but I could not find any record that she has been seen within the past 3 years) who was seen secondary to complaint of greater than 1 week of recurrent cough as well as nasal congestion.  Patient states that she feels as if she has had flulike symptoms.  Patient has had clear and yellow nasal discharge along with coughing up clear to white phlegm.  Patient with mild throat irritation secondary to postnasal drainage.  Patient denies current headache or dizziness.  Patient states that during her pregnancy she did have hypertension and was placed on blood pressure medication after her pregnancy but then when she stopped following up with her doctor, she ran out of the medication and has not restarted it.  Patient states that she is not having any headaches or dizziness like she did prior to being placed on blood pressure medication.  Patient denies any chest pain or palpitations.  Patient has had some discomfort in her chest/lungs with coughing/deep breaths but states that all of her symptoms are improving.  Patient has noticed within the past couple of months, that she has gained more weight and that she is now also snoring.  Patient denies any non-restful sleep.  Patient denies daytime fatigue.      Patient reports that she has no known drug allergies.  Patient with occasional smoking of cigarettes but this is infrequent.  Patient with family history of maternal grandmother with diabetes/breast cancer.  Patient states that her mother and father have had hypertension.  Patient had a brother who was murdered.  Patient denies any surgical history.  Patient states that her only other medical issues are that she sometimes has pain in her left lower leg and foot.  Pain is relieved with over-the-counter pain medications. Past Medical History:  Diagnosis  Date  . Chlamydia   . Chronic hypertension   . Depression   . 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 Hx MRSA infection 07/14/2011  . Infection    Family History  Problem Relation Age of Onset  . Breast cancer Maternal Grandmother   . Anemia Mother   . Hypertension Mother    Social History   Tobacco Use  . Smoking status: Former Smoker    Packs/day: 0.10    Types: Cigarettes    Last attempt to quit: 11/05/2015    Years since quitting: 2.6  . Smokeless tobacco: Former Network engineer Use Topics  . Alcohol use: Yes    Frequency: Never    Comment: occasionally   . Drug use: No  No Known Allergies   Review of Systems  Constitutional: Positive for fatigue. Negative for chills and fever.  HENT: Positive for congestion, postnasal drip and rhinorrhea. Negative for sore throat and trouble swallowing.   Respiratory: Positive for cough. Negative for shortness of breath and wheezing.   Cardiovascular: Negative for chest pain, palpitations and leg swelling.  Gastrointestinal: Negative for abdominal pain and nausea.  Genitourinary: Positive for urgency. Negative for dysuria.  Musculoskeletal: Positive for arthralgias. Negative for gait problem.  Neurological: Negative for dizziness and headaches.       Objective:   Physical Exam BP 138/90   Pulse 91   Temp 98.2 F (36.8 C) (Oral)   Resp 20   Ht 5\' 8"  (1.727 m)   Wt  264 lb (119.7 kg)   LMP 06/16/2018 (Approximate)   SpO2 98%   Breastfeeding? No   BMI 40.14 kg/m Nursing notes and vital signs reviewed General-well-nourished, well-developed obese female in no acute distress but patient with a nasal quality to her voice ENT-TMs pink bilaterally with visible landmarks, nares with moderate edema of the nasal turbinates with clear to white nasal discharge, patient with large tonsils and patient with posterior pharynx erythema with some cobblestoning; mild discomfort over the maxillary sinuses, no tenderness over the frontal  sinuses Neck-supple, no lymphadenopathy, no thyromegaly Lungs-clear to auscultation bilaterally with mild decrease in air movement/breath sounds Cardiovascular-regular rate and rhythm Abdomen-soft, nontender Extremities-no edema       Assessment & Plan:  1. URI with cough and congestion Patient with URI symptoms of cough and nasal congestion but is patient does have discolored sputum complaints as well as discolored nasal discharge along with mild maxillary sinus discomfort to palpation and TMs are pink, patient may develop sinusitis.  Patient has been placed on cetirizine 10 mg at bedtime to help with the postnasal drainage and prescription provided for amoxicillin if she has worsening of discolored nasal congestion, facial pain or onset of fever.  Patient should return to clinic in the next 1 to 2 weeks if she is not feeling better - cetirizine (ZYRTEC) 10 MG tablet; Take 1 tablet (10 mg total) by mouth at bedtime. As needed for nasal congestion  Dispense: 30 tablet; Refill: 5 - amoxicillin (AMOXIL) 500 MG capsule; Take 1 capsule (500 mg total) by mouth 2 (two) times daily.  Dispense: 20 capsule; Refill: 0  2. Tonsillar hypertrophy Patient with tonsillar hypertrophy and complaint of snoring.  Patient will be referred to ENT for further evaluation - Ambulatory referral to ENT  3. Essential hypertension Patient with history of hypertension with onset during and after pregnancy.  Patient is not currently on medication.  Blood pressure today was 138/90.  Discussed with patient that she could continue to monitor her blood pressure or restart her medication.  Her scription sent to patient's pharmacy.  If she restarts the hydrochlorothiazide that she should return to clinic for blood pressure recheck and 76-month office visit.  *Patient was offered influenza immunization at today's visit which she declined but she may also return in a few weeks for immunization when she is feeling better.  An After  Visit Summary was printed and given to the patient.  Return in about 3 months (around 09/21/2018), or if symptoms worsen or fail to improve, for blood pressure.

## 2018-06-22 NOTE — Progress Notes (Signed)
Flu shot: no Pain: left leg and foot

## 2018-07-19 ENCOUNTER — Other Ambulatory Visit: Payer: Self-pay | Admitting: Obstetrics and Gynecology

## 2018-08-30 ENCOUNTER — Other Ambulatory Visit: Payer: Self-pay

## 2018-08-30 ENCOUNTER — Encounter (HOSPITAL_BASED_OUTPATIENT_CLINIC_OR_DEPARTMENT_OTHER): Payer: Self-pay | Admitting: *Deleted

## 2018-09-04 ENCOUNTER — Other Ambulatory Visit: Payer: Self-pay

## 2018-09-04 ENCOUNTER — Encounter (HOSPITAL_BASED_OUTPATIENT_CLINIC_OR_DEPARTMENT_OTHER)
Admission: RE | Admit: 2018-09-04 | Discharge: 2018-09-04 | Disposition: A | Discharge status: Home / Self Care | Payer: Medicaid Other | Source: Ambulatory Visit | Attending: Otolaryngology | Admitting: Otolaryngology

## 2018-09-04 DIAGNOSIS — Z01810 Encounter for preprocedural cardiovascular examination: Secondary | ICD-10-CM | POA: Diagnosis not present

## 2018-09-04 LAB — BASIC METABOLIC PANEL
Anion gap: 7 (ref 5–15)
BUN: 9 mg/dL (ref 6–20)
CHLORIDE: 102 mmol/L (ref 98–111)
CO2: 27 mmol/L (ref 22–32)
CREATININE: 0.82 mg/dL (ref 0.44–1.00)
Calcium: 9.6 mg/dL (ref 8.9–10.3)
Glucose, Bld: 103 mg/dL — ABNORMAL HIGH (ref 70–99)
Potassium: 3.7 mmol/L (ref 3.5–5.1)
SODIUM: 136 mmol/L (ref 135–145)

## 2018-09-11 ENCOUNTER — Other Ambulatory Visit: Payer: Self-pay | Admitting: Family Medicine

## 2018-09-11 DIAGNOSIS — I10 Essential (primary) hypertension: Secondary | ICD-10-CM

## 2018-09-12 ENCOUNTER — Ambulatory Visit (HOSPITAL_BASED_OUTPATIENT_CLINIC_OR_DEPARTMENT_OTHER)
Admission: RE | Admit: 2018-09-12 | Discharge: 2018-09-13 | Disposition: A | Discharge status: Home / Self Care | Payer: Medicaid Other | Source: Ambulatory Visit | Attending: Otolaryngology | Admitting: Otolaryngology

## 2018-09-12 ENCOUNTER — Other Ambulatory Visit: Payer: Self-pay

## 2018-09-12 ENCOUNTER — Ambulatory Visit (HOSPITAL_BASED_OUTPATIENT_CLINIC_OR_DEPARTMENT_OTHER): Payer: Medicaid Other | Admitting: Anesthesiology

## 2018-09-12 ENCOUNTER — Encounter (HOSPITAL_BASED_OUTPATIENT_CLINIC_OR_DEPARTMENT_OTHER): Payer: Self-pay

## 2018-09-12 ENCOUNTER — Encounter (HOSPITAL_BASED_OUTPATIENT_CLINIC_OR_DEPARTMENT_OTHER)
Admission: RE | Disposition: A | Discharge status: Home / Self Care | Payer: Self-pay | Source: Ambulatory Visit | Attending: Otolaryngology

## 2018-09-12 DIAGNOSIS — I10 Essential (primary) hypertension: Secondary | ICD-10-CM | POA: Insufficient documentation

## 2018-09-12 DIAGNOSIS — F17210 Nicotine dependence, cigarettes, uncomplicated: Secondary | ICD-10-CM | POA: Insufficient documentation

## 2018-09-12 DIAGNOSIS — G4730 Sleep apnea, unspecified: Secondary | ICD-10-CM | POA: Insufficient documentation

## 2018-09-12 DIAGNOSIS — J3501 Chronic tonsillitis: Secondary | ICD-10-CM

## 2018-09-12 DIAGNOSIS — Z79899 Other long term (current) drug therapy: Secondary | ICD-10-CM | POA: Insufficient documentation

## 2018-09-12 DIAGNOSIS — J351 Hypertrophy of tonsils: Secondary | ICD-10-CM | POA: Insufficient documentation

## 2018-09-12 HISTORY — PX: TONSILLECTOMY: SHX5217

## 2018-09-12 HISTORY — DX: Chronic tonsillitis: J35.01

## 2018-09-12 LAB — POCT PREGNANCY, URINE: Preg Test, Ur: NEGATIVE

## 2018-09-12 SURGERY — TONSILLECTOMY
Anesthesia: General | Site: Throat

## 2018-09-12 MED ORDER — ONDANSETRON HCL 4 MG/2ML IJ SOLN
INTRAMUSCULAR | Status: AC
  Filled 2018-09-12: qty 2

## 2018-09-12 MED ORDER — FENTANYL CITRATE (PF) 100 MCG/2ML IJ SOLN
INTRAMUSCULAR | Status: AC
  Filled 2018-09-12: qty 2

## 2018-09-12 MED ORDER — PHENOL 1.4 % MT LIQD: 1.0000 | OROMUCOSAL | Status: DC | PRN

## 2018-09-12 MED ORDER — OXYCODONE HCL 5 MG/5ML PO SOLN
ORAL | Status: AC
  Filled 2018-09-12: qty 5

## 2018-09-12 MED ORDER — DEXAMETHASONE SODIUM PHOSPHATE 10 MG/ML IJ SOLN
INTRAMUSCULAR | Status: DC | PRN
  Administered 2018-09-12: 10 mg via INTRAVENOUS

## 2018-09-12 MED ORDER — POLYETHYLENE GLYCOL 3350 17 G PO PACK: 17.0000 g | PACK | Freq: Every day | ORAL | Status: DC

## 2018-09-12 MED ORDER — IBUPROFEN 100 MG/5ML PO SUSP
ORAL | Status: AC
  Filled 2018-09-12: qty 30

## 2018-09-12 MED ORDER — OXYCODONE HCL 5 MG/5ML PO SOLN
5.0000 mg | ORAL | Status: DC | PRN
  Administered 2018-09-12 – 2018-09-13 (×3): 5 mg via ORAL
  Filled 2018-09-12 (×4): qty 5

## 2018-09-12 MED ORDER — FENTANYL CITRATE (PF) 100 MCG/2ML IJ SOLN
INTRAMUSCULAR | Status: DC | PRN
  Administered 2018-09-12 (×2): 50 ug via INTRAVENOUS
  Administered 2018-09-12: 100 ug via INTRAVENOUS

## 2018-09-12 MED ORDER — ACETAMINOPHEN 10 MG/ML IV SOLN: 1000.0000 mg | Freq: Once | INTRAVENOUS | Status: DC | PRN

## 2018-09-12 MED ORDER — CEFAZOLIN SODIUM-DEXTROSE 2-3 GM-%(50ML) IV SOLR
INTRAVENOUS | Status: DC | PRN
  Administered 2018-09-12: 2 g via INTRAVENOUS

## 2018-09-12 MED ORDER — DEXAMETHASONE SODIUM PHOSPHATE 10 MG/ML IJ SOLN
INTRAMUSCULAR | Status: AC
  Filled 2018-09-12: qty 1

## 2018-09-12 MED ORDER — LACTATED RINGERS IV SOLN
INTRAVENOUS | Status: DC
  Administered 2018-09-12 (×2): via INTRAVENOUS

## 2018-09-12 MED ORDER — CEFAZOLIN SODIUM-DEXTROSE 2-4 GM/100ML-% IV SOLN
INTRAVENOUS | Status: AC
  Filled 2018-09-12: qty 100

## 2018-09-12 MED ORDER — FENTANYL CITRATE (PF) 100 MCG/2ML IJ SOLN: 50.0000 ug | INTRAMUSCULAR | Status: DC | PRN

## 2018-09-12 MED ORDER — HYDROMORPHONE HCL 1 MG/ML IJ SOLN
INTRAMUSCULAR | Status: AC
  Filled 2018-09-12: qty 0.5

## 2018-09-12 MED ORDER — LIDOCAINE 2% (20 MG/ML) 5 ML SYRINGE
INTRAMUSCULAR | Status: AC
  Filled 2018-09-12: qty 5

## 2018-09-12 MED ORDER — MIDAZOLAM HCL 2 MG/2ML IJ SOLN: 1.0000 mg | INTRAMUSCULAR | Status: DC | PRN

## 2018-09-12 MED ORDER — OXYCODONE HCL 5 MG PO TABS: 5.0000 mg | ORAL_TABLET | Freq: Once | ORAL | Status: AC | PRN

## 2018-09-12 MED ORDER — ONDANSETRON HCL 4 MG/2ML IJ SOLN
INTRAMUSCULAR | Status: DC | PRN
  Administered 2018-09-12: 4 mg via INTRAVENOUS

## 2018-09-12 MED ORDER — LIDOCAINE 2% (20 MG/ML) 5 ML SYRINGE
INTRAMUSCULAR | Status: DC | PRN
  Administered 2018-09-12: 40 mg via INTRAVENOUS
  Administered 2018-09-12: 60 mg via INTRAVENOUS
  Administered 2018-09-12: 100 mg via INTRAVENOUS

## 2018-09-12 MED ORDER — ACETAMINOPHEN 160 MG/5ML PO SOLN
650.0000 mg | Freq: Four times a day (QID) | ORAL | Status: DC
  Administered 2018-09-12 – 2018-09-13 (×2): 650 mg via ORAL
  Filled 2018-09-12 (×2): qty 20.3

## 2018-09-12 MED ORDER — HYDROCHLOROTHIAZIDE 25 MG PO TABS: 25.0000 mg | ORAL_TABLET | Freq: Every day | ORAL | Status: DC

## 2018-09-12 MED ORDER — OXYCODONE HCL 5 MG/5ML PO SOLN
5.0000 mg | Freq: Once | ORAL | Status: AC | PRN
  Administered 2018-09-12: 5 mg via ORAL

## 2018-09-12 MED ORDER — OXYCODONE HCL 5 MG/5ML PO SOLN: 5.0000 mg | ORAL | 0 refills | Status: DC | PRN

## 2018-09-12 MED ORDER — IBUPROFEN 100 MG/5ML PO SUSP
600.0000 mg | Freq: Four times a day (QID) | ORAL | Status: DC
  Administered 2018-09-12 (×2): 600 mg via ORAL

## 2018-09-12 MED ORDER — MIDAZOLAM HCL 2 MG/2ML IJ SOLN
INTRAMUSCULAR | Status: DC | PRN
  Administered 2018-09-12: 2 mg via INTRAVENOUS

## 2018-09-12 MED ORDER — ONDANSETRON HCL 4 MG/2ML IJ SOLN: 4.0000 mg | INTRAMUSCULAR | Status: DC | PRN

## 2018-09-12 MED ORDER — PROPOFOL 10 MG/ML IV BOLUS
INTRAVENOUS | Status: DC | PRN
  Administered 2018-09-12: 60 mg via INTRAVENOUS
  Administered 2018-09-12: 200 mg via INTRAVENOUS
  Administered 2018-09-12 (×2): 50 mg via INTRAVENOUS
  Administered 2018-09-12: 40 mg via INTRAVENOUS

## 2018-09-12 MED ORDER — HYDROMORPHONE HCL 1 MG/ML IJ SOLN
0.2500 mg | INTRAMUSCULAR | Status: DC | PRN
  Administered 2018-09-12: 0.5 mg via INTRAVENOUS

## 2018-09-12 MED ORDER — SUGAMMADEX SODIUM 500 MG/5ML IV SOLN
INTRAVENOUS | Status: DC | PRN
  Administered 2018-09-12: 400 mg via INTRAVENOUS

## 2018-09-12 MED ORDER — ONDANSETRON HCL 4 MG PO TABS: 4.0000 mg | ORAL_TABLET | ORAL | Status: DC | PRN

## 2018-09-12 MED ORDER — CEFAZOLIN SODIUM-DEXTROSE 2-4 GM/100ML-% IV SOLN: 2.0000 g | INTRAVENOUS | Status: DC

## 2018-09-12 MED ORDER — MORPHINE SULFATE (PF) 4 MG/ML IV SOLN: 2.0000 mg | INTRAVENOUS | Status: DC | PRN

## 2018-09-12 MED ORDER — DEXTROSE-NACL 5-0.9 % IV SOLN
INTRAVENOUS | Status: DC
  Administered 2018-09-12: 15:00:00 via INTRAVENOUS

## 2018-09-12 MED ORDER — SCOPOLAMINE 1 MG/3DAYS TD PT72: 1.0000 | MEDICATED_PATCH | Freq: Once | TRANSDERMAL | Status: DC | PRN

## 2018-09-12 MED ORDER — MIDAZOLAM HCL 2 MG/2ML IJ SOLN
INTRAMUSCULAR | Status: AC
  Filled 2018-09-12: qty 2

## 2018-09-12 MED ORDER — ROCURONIUM BROMIDE 50 MG/5ML IV SOSY
PREFILLED_SYRINGE | INTRAVENOUS | Status: DC | PRN
  Administered 2018-09-12: 50 mg via INTRAVENOUS

## 2018-09-12 MED ORDER — PROMETHAZINE HCL 25 MG/ML IJ SOLN: 6.2500 mg | INTRAMUSCULAR | Status: DC | PRN

## 2018-09-12 SURGICAL SUPPLY — 27 items
CANISTER SUCT 1200ML W/VALVE (MISCELLANEOUS) ×2 IMPLANT
CATH ROBINSON RED A/P 10FR (CATHETERS) ×2 IMPLANT
COAGULATOR SUCT SWTCH 10FR 6 (ELECTROSURGICAL) ×2 IMPLANT
COVER BACK TABLE 60X90IN (DRAPES) ×2 IMPLANT
COVER WAND RF STERILE (DRAPES) IMPLANT
ELECT COATED BLADE 2.86 ST (ELECTRODE) ×2 IMPLANT
ELECT REM PT RETURN 9FT ADLT (ELECTROSURGICAL) ×2
ELECT REM PT RETURN 9FT PED (ELECTROSURGICAL)
ELECTRODE REM PT RETRN 9FT PED (ELECTROSURGICAL) IMPLANT
ELECTRODE REM PT RTRN 9FT ADLT (ELECTROSURGICAL) ×1 IMPLANT
GLOVE BIO SURGEON STRL SZ 6.5 (GLOVE) ×2 IMPLANT
GLOVE ECLIPSE 6.5 STRL STRAW (GLOVE) ×2 IMPLANT
GOWN STRL REUS W/ TWL LRG LVL3 (GOWN DISPOSABLE) ×1 IMPLANT
GOWN STRL REUS W/TWL LRG LVL3 (GOWN DISPOSABLE) ×1
NS IRRIG 1000ML POUR BTL (IV SOLUTION) ×2 IMPLANT
PACK BASIN DAY SURGERY FS (CUSTOM PROCEDURE TRAY) ×2 IMPLANT
PAD ARMBOARD 7.5X6 YLW CONV (MISCELLANEOUS) ×2 IMPLANT
PENCIL BUTTON HOLSTER BLD 10FT (ELECTRODE) ×2 IMPLANT
SHEET MEDIUM DRAPE 40X70 STRL (DRAPES) ×2 IMPLANT
SLEEVE SCD COMPRESS KNEE MED (MISCELLANEOUS) IMPLANT
SPONGE TONSIL TAPE 1.25 RFD (DISPOSABLE) ×2 IMPLANT
SYR BULB 3OZ (MISCELLANEOUS) ×2 IMPLANT
TOWEL GREEN STERILE FF (TOWEL DISPOSABLE) ×2 IMPLANT
TUBE CONNECTING 20X1/4 (TUBING) ×2 IMPLANT
TUBE SALEM SUMP 12R W/ARV (TUBING) IMPLANT
TUBE SALEM SUMP 16 FR W/ARV (TUBING) ×2 IMPLANT
YANKAUER SUCT BULB TIP NO VENT (SUCTIONS) ×2 IMPLANT

## 2018-09-12 NOTE — Anesthesia Preprocedure Evaluation (Addendum)
Anesthesia Evaluation  Patient identified by MRN, date of birth, ID band Patient awake    Reviewed: Allergy & Precautions, NPO status , Patient's Chart, lab work & pertinent test results  History of Anesthesia Complications Negative for: history of anesthetic complications  Airway Mallampati: II  TM Distance: >3 FB Neck ROM: Full    Dental  (+) Dental Advisory Given, Partial Upper,    Pulmonary former smoker,    Pulmonary exam normal breath sounds clear to auscultation       Cardiovascular hypertension, Pt. on medications Normal cardiovascular exam Rhythm:Regular Rate:Normal     Neuro/Psych negative neurological ROS     GI/Hepatic negative GI ROS, Neg liver ROS,   Endo/Other  Morbid obesity  Renal/GU negative Renal ROS     Musculoskeletal negative musculoskeletal ROS (+)   Abdominal   Peds  Hematology negative hematology ROS (+)   Anesthesia Other Findings Day of surgery medications reviewed with the patient.  Reproductive/Obstetrics                            Anesthesia Physical Anesthesia Plan  ASA: III  Anesthesia Plan: General   Post-op Pain Management:    Induction: Intravenous  PONV Risk Score and Plan: 3 and Treatment may vary due to age or medical condition, Ondansetron, Dexamethasone, Midazolam and Scopolamine patch - Pre-op  Airway Management Planned: Oral ETT  Additional Equipment:   Intra-op Plan:   Post-operative Plan: Extubation in OR  Informed Consent: I have reviewed the patients History and Physical, chart, labs and discussed the procedure including the risks, benefits and alternatives for the proposed anesthesia with the patient or authorized representative who has indicated his/her understanding and acceptance.   Dental advisory given  Plan Discussed with: CRNA  Anesthesia Plan Comments:        Anesthesia Quick Evaluation

## 2018-09-12 NOTE — H&P (Signed)
The surgical history remains accurate and without interval change. The condition still exists which makes the procedure necessary. The patient and/or family is aware of their condition and has been informed of the risks and benefits of surgery, as well as alternatives. All parties have elected to proceed with surgery.   Surgical plan: tonsillectomy  Otolaryngology New Patient Note  Subjective: [name] is a 27 y.o. female seen in consultation at the request of Fulp, Arna Medici, MD for evaluation of tonsil problem. In the last 12 months, the patient has had no tonsillar infections. No prior peritonsillar abscesses. No tonsil tones. Has noted voice change due to tonsil size in last few months.   + snoring (main concern). + No apneas or pauses in breathing (mostly gasps, rare pauses). Sleep quality is good. Wakes feeling rested. Rare napping. Has good energy. No history of OSA.   Has 3 children (daughters). Works full time. Smokes occasionally, not daily.   Past Medical History:  Diagnosis Date  . Hypertension   History reviewed. No pertinent surgical history. History reviewed. No pertinent family history. Social History   Socioeconomic History  . Marital status: Unknown  Spouse name: Not on file  . Number of children: Not on file  . Years of education: Not on file  . Highest education level: Not on file  Occupational History  . Not on file  Social Needs  . Financial resource strain: Not on file  . Food insecurity:  Worry: Not on file  Inability: Not on file  . Transportation needs:  Medical: Not on file  Non-medical: Not on file  Tobacco Use  . Smoking status: Current Some Day Smoker  Packs/day: 0.25  Types: Cigarettes  . Smokeless tobacco: Never Used  Substance and Sexual Activity  . Alcohol use: Yes  . Drug use: Never  . Sexual activity: Not on file  Lifestyle  . Physical activity:  Days per week: Not on file  Minutes per session: Not on file  . Stress:  Not on file  Relationships  . Social connections:  Talks on phone: Not on file  Gets together: Not on file  Attends religious service: Not on file  Active member of club or organization: Not on file  Attends meetings of clubs or organizations: Not on file  Relationship status: Not on file  Other Topics Concern  . Not on file  Social History Narrative  . Not on file   No Known Allergies Updated Medication List:   multivitamin capsule  Sig - Route: Take 1 capsule by mouth daily. - Oral  Class: Historical Med    ROS A complete review of systems was conducted and was negative except as stated in the HPI.   Objective: Vitals:  07/24/18 1358  BP: 152/84  Pulse: 92  Height: 1.727 m (5\' 8" )  Weight: 117.9 kg (260 lb)  BMI (Calculated): 39.6   Physical Exam:  General Normocephalic, Awake, Alert and appropriate for the exam  Eyes PERRL, no scleral icterus or conjunctival hemorrhage.  EOMI.  Ears Right ear- EAC patent, no obstructing cerumen. TM: intact, no effusion, no retraction, normal landmarks Left ear- EAC patent, no obstructing cerumen. TM: intact, no effusion, no retraction, normal landmarks  Nose Patent, No polyps or masses seen.  Oral Pharynx No mucosal lesions or tumors seen. Dentition is good. Partial maxillary denture. . 4+ tonsils.  Mirror: cannot see glottis 2/2 tonsil siaze  Lymphatics No cervical lymphadenopathy or masses on palpation  Endocrine No thyroidmegaly, no  thyroid masses palpated  Cardio-vascular No cyanosis, regular rate  Pulmonary No audible stridor, Breathing easily with no labor. No dysphonia.  Neuro Symmetric facial movement.  Tongue protrudes in midline.  Psychiatry Appropriate affect and mood for clinic visit.  Skin No scars or lesions on face or neck.    Assessment:  My impression is that Alexandria Fisher has  1. Tonsillar hypertrophy     We discussed the risks and benefits of tonsillectomy. We discussed the goal of decreasing likelihood of  future throat infections and optimizing nighttime breathing. We also discussed the risk of continued throat infections, bleeding, airway swelling, injury to surrounding structures.   At this time, she has elected to proceed with surgery, prefers end of November. Will likely monitor overnight afterwards due to sleep concerns. Counseled on smoking cessation.   Plan:  1. Tonsillectomy at [facility] or [facility], 23 hour observation after  Electronically signed by: Gavin Pound, MD 07/24/2018 2:37 PM

## 2018-09-12 NOTE — Anesthesia Postprocedure Evaluation (Signed)
Anesthesia Post Note  Patient: [place]  Procedure(s) Performed: TONSILLECTOMY (N/A Throat)     Patient location during evaluation: PACU Anesthesia Type: General Level of consciousness: awake and alert, awake and oriented Pain management: pain level controlled Vital Signs Assessment: post-procedure vital signs reviewed and stable Respiratory status: spontaneous breathing, nonlabored ventilation and respiratory function stable Cardiovascular status: blood pressure returned to baseline and stable Postop Assessment: no apparent nausea or vomiting Anesthetic complications: no    Last Vitals:  Vitals:   09/12/18 1415 09/12/18 1445  BP: 129/74 130/85  Pulse: 86 85  Resp: (!) 21 18  Temp:  36.5 C  SpO2: 95% 94%    Last Pain:  Vitals:   09/12/18 1445  TempSrc:   PainSc: 4                  Catalina Gravel

## 2018-09-12 NOTE — Anesthesia Procedure Notes (Signed)
Procedure Name: Intubation Date/Time: 09/12/2018 11:19 AM Performed by: Genelle Bal, CRNA Pre-anesthesia Checklist: Patient identified, Emergency Drugs available, Suction available and Patient being monitored Patient Re-evaluated:Patient Re-evaluated prior to induction Oxygen Delivery Method: Circle system utilized Preoxygenation: Pre-oxygenation with 100% oxygen Induction Type: IV induction Ventilation: Mask ventilation without difficulty and Oral airway inserted - appropriate to patient size Laryngoscope Size: Sabra Heck and 2 Grade View: Grade II Tube type: Oral Tube size: 7.0 mm Number of attempts: 1 Airway Equipment and Method: Stylet and Oral airway Placement Confirmation: ETT inserted through vocal cords under direct vision,  positive ETCO2 and breath sounds checked- equal and bilateral Secured at: 21 cm Tube secured with: Tape Dental Injury: Teeth and Oropharynx as per pre-operative assessment

## 2018-09-12 NOTE — Transfer of Care (Signed)
Immediate Anesthesia Transfer of Care Note  Patient: Alexandria Fisher  Procedure(s) Performed: TONSILLECTOMY (N/A Throat)  Patient Location: PACU  Anesthesia Type:General  Level of Consciousness: awake, alert  and oriented  Airway & Oxygen Therapy: Patient Spontanous Breathing and Patient connected to face mask oxygen  Post-op Assessment: Report given to RN and Post -op Vital signs reviewed and stable  Post vital signs: Reviewed and stable  Last Vitals:  Vitals Value Taken Time  BP 150/112 09/12/2018 12:32 PM  Temp    Pulse 93 09/12/2018 12:34 PM  Resp 12 09/12/2018 12:34 PM  SpO2 100 % 09/12/2018 12:34 PM  Vitals shown include unvalidated device data.  Last Pain:  Vitals:   09/12/18 1016  TempSrc: Oral  PainSc: 0-No pain         Complications: No apparent anesthesia complications

## 2018-09-12 NOTE — Discharge Instructions (Addendum)
[facility], [place], [place] Phone: [phone]   Tonsillectomy Aftercare Instructions  Most  will experience some ear and throat pain for up to 2 weeks after a tonsillectomy. They usually have good days and bad days. A low grade fever up to 101F is common on the day of surgery and the next day.   You may a very small amount of blood in your child's saliva on the first day or two after surgery.  There will be white scabs where the tonsils were, sometimes accompanied by little black spots. This is from the technique used to remove the tonsils and the way that the body heals this area.  These scabs usually fall off in 5 to 10 days.  Your child may also have bad breath for 2 weeks.   Your child may snore or breathe through his or her mouth at night. This usually stops in a week or two. The mouth- breathing can cause mouth dryness and pain. The use of an air humidifier next to your childs bed or close to your child may be helpful. Be sure to follow the directions for cleaning the machine.   Your child's voice may also sound odd after surgery, but should return to normal in 2 to 3 weeks.   Many children, even thin ones, tend to lose a little weight after the surgery. As long as your child is drinking liquids, this is okay. Your child will probably gain the weight back in 2 to 3 weeks.   This care sheet gives you a general idea about how long it will take for your child to recover. But each child recovers at a different pace. Follow the steps below to help your child get better as quickly as possible.   How can you care for your child at home?  Activity  Your child may want to spend the first few days in bed. When your child is ready, he or she can begin playing again. Encourage quiet indoor play for the first 3 to 5 days.  Your child will probably be able to go back to school or day care in 7 to 10 days. He or she should  not go to gym or PE class for about 2 weeks or until your doctor says it is okay.  For about two weeks, let your child pace their activities themselves.  They should avoid coached sports, swimming, wrestling with siblings, being tickled, or other physical situations where they are not in control of the effort they need to exert. For about 7 days, keep your child away from crowds or people that you know who have a cold or the flu. This can help prevent your child from getting an infection.  You and your child should stay close to medical care for about 2 weeks in case there is delayed bleeding.  Your child may bathe as usual.  Diet  Have your child drink plenty of fluids to avoid becoming dehydrated. Use clear fluids, such as water, apple juice, and flavored ice pops. Avoid hot drinks, soda pop, and citrus juices, such as orange juice. These may cause more pain.  When your child is ready to eat, start with easy-to-swallow foods. These include soft noodles, pudding, and dairy foods such as yogurt and ice cream. Dairy foods may cause the saliva to thicken, making it hard to swallow. Try them in small amounts. Canned or cooked fruit, scrambled eggs, and mashed potatoes  are other good choices.  You may notice a change in your child's bowel habits right after surgery. This is common. If your child has not had a bowel movement after a couple of days, call your doctor.  Medicines & Pain Control See that your child takes pain medicines exactly as directed.  In most children, we recommend treating pain with alternating doses of Tylenol and ibuprofen as directed on the bottle according to their weight.   If this does not control pain to where your child can drink and sleep some, then try the Oxycodone prescription. Example of alternating 6 hour doses:   9AM  Acetaminophen (Tylenol)   Noon Ibuprofen (Pediaprofen, Advil)   3PM Acetaminophen (Tylenol)   6PM Ibuprofen (Pediaprofen, Advil) Do not give aspirin to  anyone younger than 20. It has been linked to Reye syndrome, a serious illness.  If you think the pain medicine is making your child sick to his or her stomach:  Give the medicine after meals (unless your doctor has told you not to).  Ask your doctor for a different pain medicine.   When should you call for help?  Call 911 anytime you think your child may need emergency care. For example, call if:  Your child passes out (loses consciousness).  Your child has trouble breathing.  Go to the emergency room if:  Your child bleeds from the mouth or nose. If you child bleeds more than a small teaspoon, go to the ER immediately.  Call your doctor if:  Your child has signs of infection, such as:  Increased pain, swelling, warmth, or redness.  Pus draining from the area.  Swollen lymph nodes in the neck, armpits, or groin.  A fever.  Your child has a fever over 101.76F that will not come down, even if he or she drinks fluids or takes medicine.  Your child has signs of needing more fluids. These signs include sunken eyes with few tears, a dry mouth with little or no spit, and little or no urine for 8 or more hours.  Your child will not drink liquids the day after surgery.   Post Anesthesia Home Care Instructions  Activity: Get plenty of rest for the remainder of the day. A responsible individual must stay with you for 24 hours following the procedure.  For the next 24 hours, DO NOT: -Drive a car -Paediatric nurse -Drink alcoholic beverages -Take any medication unless instructed by your physician -Make any legal decisions or sign important papers.  Meals: Start with liquid foods such as gelatin or soup. Progress to regular foods as tolerated. Avoid greasy, spicy, heavy foods. If nausea and/or vomiting occur, drink only clear liquids until the nausea and/or vomiting subsides. Call your physician if vomiting continues.  Special Instructions/Symptoms: Your throat may feel dry or sore from the  anesthesia or the breathing tube placed in your throat during surgery. If this causes discomfort, gargle with warm salt water. The discomfort should disappear within 24 hours.  If you had a scopolamine patch placed behind your ear for the management of post- operative nausea and/or vomiting:  1. The medication in the patch is effective for 72 hours, after which it should be removed.  Wrap patch in a tissue and discard in the trash. Wash hands thoroughly with soap and water. 2. You may remove the patch earlier than 72 hours if you experience unpleasant side effects which may include dry mouth, dizziness or visual disturbances. 3. Avoid touching the patch. Wash your hands  with soap and water after contact with the patch.

## 2018-09-12 NOTE — Op Note (Signed)
DATE OF PROCEDURE:  09/12/2018    PRE-OPERATIVE DIAGNOSIS:  TONSILAR HYPERTROPHY,SLEEP DISORDERED BREATHING    POST-OPERATIVE DIAGNOSIS:  Same    PROCEDURE(S): Tonsillectomy   SURGEON: Gavin Pound, MD    ASSISTANT(S): none    ANESTHESIA: General endotracheal anesthesia      ESTIMATED BLOOD LOSS: 150 mL   SPECIMENS: Right tonsil Left tonsil    COMPLICATIONS: None    OPERATIVE FINDINGS: 3+ tonsils bilaterally were extremely scarred from multiple prior tonsillitis infections.  No masses or lesions.  There was some bleeding of the left superior pole which was controlled readily with electrocautery.    OPERATIVE DETAILS: With the patient in a comfortable supine position,  general orotracheal anesthesia was induced without difficulty.  A routine surgical timeout was performed.   The bed was turned 90 away from anesthesia and placed in Trendelenburg.  A clean preparation and draping was accomplished.  Taking care to protect lips, teeth, and endotracheal tube, the Crowe-Davis mouth gag was introduced, expanded for visualization, and suspended from the Hensley stand in the standard fashion.  The findings were as described above.  Next, a red rubber catheter was placed in the nose to retract the palate.  Beginning on the right side, the tonsil was grasped and retracted medially.  The mucosa over the anterior and superior poles was coagulated and then cut down to the capsule of the tonsil using Bovie electrocautery.  Crossing vessels were coagulated as identified.  The tonsil was removed in its entirety as determined by examination of both tonsil and fossa.  Additional quantity of cautery rendered the fossa hemostatic.  An identical procedure was performed on the opposite side.  Hemostasis was achieved with cautery.  Upon achieving hemostasis in the nasopharynx, the oropharynx was again observed to be hemostatic.    At this point the palate retractor and mouthgag  were relaxed for several minutes.  Upon reexpansion,  hemostasis was observed.  An orogastric tube was briefly placed and a small amount of clear secretions was evacuated.  This tube was removed.  The mouth gag and palate retractor were relaxed and removed.  The dental status was intact.   At this point the procedure was completed.  The patient was returned to anesthesia, awakened, extubated, and transferred to recovery in stable condition.

## 2018-09-13 DIAGNOSIS — J351 Hypertrophy of tonsils: Secondary | ICD-10-CM | POA: Diagnosis not present

## 2018-09-13 MED ORDER — ACETAMINOPHEN 160 MG/5ML PO SOLN: 650.0000 mg | Freq: Four times a day (QID) | ORAL | 0 refills | Status: DC

## 2018-09-13 MED ORDER — IBUPROFEN 100 MG/5ML PO SUSP: 600.0000 mg | Freq: Four times a day (QID) | ORAL | Status: DC

## 2018-09-13 NOTE — Progress Notes (Signed)
ENT Post Operative Note  Subjective: No nausea, vomiting No difficulty voiding Pain well controlled No bleeding  Vitals:   09/13/18 0455 09/13/18 0725  BP:    Pulse:  73  Resp:    Temp:    SpO2: 99% 99%     OBJECTIVE  Gen: alert, cooperative, appropriate Head/ENT: EOMI, neck supple, mucus membranes moist and pink, conjunctiva clear No oropharyngeal bleeding Face moves symmetrically Respiratory: Voice without dysphonia. non-labored breathing, no accessory muscle use, normal HR, good O2 saturations   ASSESS/ PLAN  [place] is a 27 y.o. female who is POD1 from tonsillectomy.  -Dc home -Rx for oxycodone -Scheduled tylenol and ibuprofen -Care instructions discussed  Helayne [place]in[place], MD

## 2018-09-14 ENCOUNTER — Encounter (HOSPITAL_BASED_OUTPATIENT_CLINIC_OR_DEPARTMENT_OTHER): Payer: Self-pay | Admitting: Otolaryngology

## 2018-09-21 ENCOUNTER — Ambulatory Visit: Payer: Medicaid Other | Admitting: Family Medicine

## 2018-11-12 ENCOUNTER — Other Ambulatory Visit: Payer: Self-pay | Admitting: Family Medicine

## 2018-11-12 DIAGNOSIS — J069 Acute upper respiratory infection, unspecified: Secondary | ICD-10-CM

## 2018-11-12 DIAGNOSIS — I10 Essential (primary) hypertension: Secondary | ICD-10-CM

## 2018-12-06 ENCOUNTER — Other Ambulatory Visit: Payer: Self-pay | Admitting: Family Medicine

## 2018-12-06 DIAGNOSIS — I10 Essential (primary) hypertension: Secondary | ICD-10-CM

## 2018-12-06 DIAGNOSIS — J069 Acute upper respiratory infection, unspecified: Secondary | ICD-10-CM

## 2019-01-12 ENCOUNTER — Other Ambulatory Visit: Payer: Self-pay | Admitting: Family Medicine

## 2019-01-12 DIAGNOSIS — I10 Essential (primary) hypertension: Secondary | ICD-10-CM

## 2019-02-11 ENCOUNTER — Other Ambulatory Visit: Payer: Self-pay | Admitting: Family Medicine

## 2019-02-11 DIAGNOSIS — I10 Essential (primary) hypertension: Secondary | ICD-10-CM

## 2019-05-08 ENCOUNTER — Ambulatory Visit: Payer: Medicaid Other | Attending: Family Medicine | Admitting: Physician Assistant

## 2019-05-08 ENCOUNTER — Other Ambulatory Visit: Payer: Self-pay

## 2019-05-08 DIAGNOSIS — Z209 Contact with and (suspected) exposure to unspecified communicable disease: Secondary | ICD-10-CM

## 2019-05-08 NOTE — Progress Notes (Signed)
Pt. Want to get Covid testing because her sister was around someone who got it.  Pt. Does not have any symptoms.

## 2019-05-08 NOTE — Progress Notes (Signed)
Virtual Visit via Telephone Note  I connected with ANNALEAH ARATA on 05/08/19 at  4:10 PM EDT by telephone and verified that I am speaking with the correct person using two identifiers.   I discussed the limitations, risks, security and privacy concerns of performing an evaluation and management service by telephone and the availability of in person appointments. I also discussed with the patient that there may be a patient responsible charge related to this service. The patient expressed understanding and agreed to proceed.  Patient location:  home My Location:  [place] office Persons on the call:  Me and the patient  History of Present Illness: Her sister had an exposure to someone with Covid a couple of weeks ago.  The patient is wanting to get a Covid test bc she was around her sister briefly about 1 week ago.  No cough.  No SOB.  No fever.  No GI s/sx.  Feels fine.   Observations/Objective:  A&Ox3   Assessment and Plan: 1. Exposure to communicable disease - Novel Coronavirus, NAA ([facility]); Future    Follow Up Instructions: With PCP prn   I discussed the assessment and treatment plan with the patient. The patient was provided an opportunity to ask questions and all were answered. The patient agreed with the plan and demonstrated an understanding of the instructions.   The patient was advised to call back or seek an in-person evaluation if the symptoms worsen or if the condition fails to improve as anticipated.  I provided 9 minutes of non-face-to-face time during this encounter.   Freeman Caldron, PA-C  Patient ID: SANDI TOWE, female   DOB: 05-22-1991, 28 y.o.   MRN: [id number]

## 2019-05-10 ENCOUNTER — Other Ambulatory Visit: Payer: Self-pay

## 2019-05-10 DIAGNOSIS — Z20822 Contact with and (suspected) exposure to covid-19: Secondary | ICD-10-CM

## 2019-05-12 LAB — NOVEL CORONAVIRUS, NAA: SARS-CoV-2, NAA: NOT DETECTED

## 2019-05-14 ENCOUNTER — Other Ambulatory Visit: Payer: Self-pay

## 2019-05-14 DIAGNOSIS — Z20822 Contact with and (suspected) exposure to covid-19: Secondary | ICD-10-CM

## 2019-05-16 LAB — NOVEL CORONAVIRUS, NAA: SARS-CoV-2, NAA: NOT DETECTED

## 2019-07-18 ENCOUNTER — Telehealth: Payer: Self-pay | Admitting: Family Medicine

## 2019-07-18 NOTE — Telephone Encounter (Signed)
1) Medication(s) Requested (by name): Patient would like to be prescribed something for BV please follow up  2) Pharmacy of Choice: [facility] on [address]

## 2019-07-19 ENCOUNTER — Other Ambulatory Visit (HOSPITAL_BASED_OUTPATIENT_CLINIC_OR_DEPARTMENT_OTHER): Payer: Medicaid Other | Admitting: Family Medicine

## 2019-07-19 DIAGNOSIS — N760 Acute vaginitis: Secondary | ICD-10-CM

## 2019-07-19 DIAGNOSIS — B9689 Other specified bacterial agents as the cause of diseases classified elsewhere: Secondary | ICD-10-CM

## 2019-07-19 MED ORDER — METRONIDAZOLE 500 MG PO TABS: 500.0000 mg | ORAL_TABLET | Freq: Two times a day (BID) | ORAL | 0 refills | Status: DC

## 2019-07-19 NOTE — Telephone Encounter (Signed)
Informed patient with information and she verbalized understanding.  

## 2019-07-19 NOTE — Telephone Encounter (Signed)
Prescription sent to patient's pharmacy.  Patient will need office visit if continued symptoms

## 2019-07-19 NOTE — Progress Notes (Signed)
Patient ID: Alexandria Fisher, female   DOB: Feb 13, 1991, 28 y.o.   MRN: [id number]    Patient requesting medication for treatment of BV.  Prescription for metronidazole sent to patient's requested pharmacy.

## 2019-09-13 ENCOUNTER — Encounter: Payer: Self-pay | Admitting: Family Medicine

## 2019-09-13 ENCOUNTER — Ambulatory Visit: Payer: Medicaid Other | Attending: Family Medicine | Admitting: Family Medicine

## 2019-09-13 ENCOUNTER — Other Ambulatory Visit: Payer: Self-pay

## 2019-09-13 ENCOUNTER — Other Ambulatory Visit (HOSPITAL_COMMUNITY)
Admission: RE | Admit: 2019-09-13 | Discharge: 2019-09-13 | Disposition: A | Discharge status: Home / Self Care | Payer: Medicaid Other | Source: Ambulatory Visit | Attending: Family Medicine | Admitting: Family Medicine

## 2019-09-13 VITALS — BP 125/88 | HR 104 | Temp 98.7°F | Resp 18 | Ht 66.0 in | Wt 276.0 lb

## 2019-09-13 DIAGNOSIS — Z0000 Encounter for general adult medical examination without abnormal findings: Secondary | ICD-10-CM | POA: Insufficient documentation

## 2019-09-13 DIAGNOSIS — N760 Acute vaginitis: Secondary | ICD-10-CM | POA: Diagnosis present

## 2019-09-13 DIAGNOSIS — M5489 Other dorsalgia: Secondary | ICD-10-CM

## 2019-09-13 DIAGNOSIS — Z124 Encounter for screening for malignant neoplasm of cervix: Secondary | ICD-10-CM | POA: Diagnosis present

## 2019-09-13 DIAGNOSIS — Z0001 Encounter for general adult medical examination with abnormal findings: Secondary | ICD-10-CM | POA: Diagnosis not present

## 2019-09-13 DIAGNOSIS — B9689 Other specified bacterial agents as the cause of diseases classified elsewhere: Secondary | ICD-10-CM | POA: Insufficient documentation

## 2019-09-13 DIAGNOSIS — N898 Other specified noninflammatory disorders of vagina: Secondary | ICD-10-CM

## 2019-09-13 DIAGNOSIS — Z13220 Encounter for screening for lipoid disorders: Secondary | ICD-10-CM

## 2019-09-13 MED ORDER — METRONIDAZOLE 500 MG PO TABS: 500.0000 mg | ORAL_TABLET | Freq: Two times a day (BID) | ORAL | 0 refills | Status: DC

## 2019-09-13 NOTE — Progress Notes (Signed)
Established Patient Office Visit  Subjective:  Patient ID: Alexandria Fisher, female    DOB: [birth year]  Age: 28 y.o. MRN: [id number]  CC:  Chief Complaint  Patient presents with  . Annual Exam    HPI Alexandria Fisher, 28 year old female who presents for annual well exam.  Her last Pap smear appears to have been on 12/31/2015 and her Pap smear was normal at that time.  She reports a past history of hypertension for which she was on medication at 1 point but she stopped taking the medication.  She reports that she is having some current issues with vaginal discharge.  She tends to get recurrent bacterial vaginosis.  She states that she took medication about a month ago but did not take the medication exactly as it was prescribed and she believes that she still has an infection.  She has a clear discharge with a bad odor.  She denies abdominal or pelvic pain.  Menses occur regularly with last period being 08/31/2019.  Past Medical History:  Diagnosis Date  . Chlamydia   . Chronic hypertension   . Depression   . 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 Hx MRSA infection 07/14/2011  . Infection     Past Surgical History:  Procedure Laterality Date  . DILATION AND EVACUATION N/A 09/19/2013   Procedure: DILATATION AND EVACUATION;  Surgeon: Guss Bunde, MD;  Location: [facility];  Service: Gynecology;  Laterality: N/A;  . TONSILLECTOMY N/A 09/12/2018   Procedure: TONSILLECTOMY;  Surgeon: Helayne Seminole, MD;  Location: [facility];  Service: ENT;  Laterality: N/A;    Family History  Problem Relation Age of Onset  . Breast cancer Maternal Grandmother   . Anemia Mother   . Hypertension Mother     Social History   Socioeconomic History  . Marital status: Single    Spouse name: Not on file  . Number of children: Not on file  . Years of education: Not on file  . Highest education level: Not on file  Occupational History  . Not on file  Social Needs  .  Financial resource strain: Not on file  . Food insecurity    Worry: Not on file    Inability: Not on file  . Transportation needs    Medical: Not on file    Non-medical: Not on file  Tobacco Use  . Smoking status: Former Smoker    Packs/day: 0.10    Types: Cigarettes    Quit date: 11/05/2015    Years since quitting: 3.8  . Smokeless tobacco: Former Network engineer and Sexual Activity  . Alcohol use: Yes    Frequency: Never    Comment: occasionally   . Drug use: No  . Sexual activity: Yes    Birth control/protection: Condom  Lifestyle  . Physical activity    Days per week: Not on file    Minutes per session: Not on file  . Stress: Not on file  Relationships  . Social Herbalist on phone: Not on file    Gets together: Not on file    Attends religious service: Not on file    Active member of club or organization: Not on file    Attends meetings of clubs or organizations: Not on file    Relationship status: Not on file  . Intimate partner violence    Fear of current or ex partner: Not on file    Emotionally abused: Not on  file    Physically abused: Not on file    Forced sexual activity: Not on file  Other Topics Concern  . Not on file  Social History Narrative  . Not on file    Outpatient Medications Prior to Visit  Medication Sig Dispense Refill  . hydrochlorothiazide (HYDRODIURIL) 25 MG tablet Take 1 tablet (25 mg total) by mouth daily. MUST MAKE APPT FOR FURTHER REFILLS (Patient not taking: Reported on 05/08/2019) 30 tablet 0  . acetaminophen (TYLENOL) 160 MG/5ML solution Take 20.3 mLs (650 mg total) by mouth every 6 (six) hours. (Patient not taking: Reported on 05/08/2019) 120 mL 0  . ibuprofen (ADVIL,MOTRIN) 100 MG/5ML suspension Take 30 mLs (600 mg total) by mouth every 6 (six) hours. (Patient not taking: Reported on 05/08/2019)    . metroNIDAZOLE (FLAGYL) 500 MG tablet Take 1 tablet (500 mg total) by mouth 2 (two) times daily. 14 tablet 0  . oxyCODONE  (ROXICODONE) 5 MG/5ML solution Take 5 mLs (5 mg total) by mouth every 4 (four) hours as needed for severe pain. (Patient not taking: Reported on 05/08/2019) 220 mL 0   No facility-administered medications prior to visit.     No Known Allergies  ROS Review of Systems  Constitutional: Negative for chills, fatigue and fever.  HENT: Negative for sore throat and trouble swallowing.   Eyes: Negative for photophobia and visual disturbance.  Respiratory: Negative for cough and shortness of breath.   Cardiovascular: Negative for chest pain, palpitations and leg swelling.  Gastrointestinal: Negative for abdominal pain, constipation, diarrhea and nausea.  Endocrine: Negative for cold intolerance, heat intolerance, polydipsia, polyphagia and polyuria.  Genitourinary: Positive for vaginal discharge. Negative for dysuria, flank pain, frequency and menstrual problem.  Musculoskeletal: Positive for back pain (occasional sharp pain across the lower back).  Skin: Negative for rash and wound.  Neurological: Negative for dizziness and headaches.  Hematological: Negative for adenopathy. Does not bruise/bleed easily.  Psychiatric/Behavioral: Negative for self-injury and suicidal ideas.      Objective:    Physical Exam  Constitutional: She is oriented to person, place, and time. She appears well-developed and well-nourished.  WNWD obese young adult female in NAD  Neck: Normal range of motion. Neck supple. No JVD present. No thyromegaly present.  Cardiovascular: Normal rate and regular rhythm.  Pulmonary/Chest: Effort normal and breath sounds normal.  Abdominal: Soft. There is no abdominal tenderness. There is no rebound and no guarding.  Musculoskeletal:        General: No tenderness or edema.     Comments: No reproducible low back pain; patient with lordosis  Lymphadenopathy:    She has no cervical adenopathy.  Neurological: She is alert and oriented to person, place, and time.  Skin: Skin is warm  and dry.  Psychiatric: She has a normal mood and affect. Her behavior is normal.  Nursing note and vitals reviewed.   BP 125/88 (BP Location: Right Arm, Patient Position: Sitting, Cuff Size: Large)   Pulse (!) 104   Temp 98.7 F (37.1 C) (Oral)   Resp 18   Ht 5\' 6"  (1.676 m)   Wt 276 lb (125.2 kg)   LMP 08/31/2019   SpO2 100%   BMI 44.55 kg/m  Wt Readings from Last 3 Encounters:  09/13/19 276 lb (125.2 kg)  09/12/18 263 lb 7.2 oz (119.5 kg)  06/22/18 264 lb (119.7 kg)     Health Maintenance Due  Topic Date Due  . PAP-Cervical Cytology Screening  12/31/2018  . PAP SMEAR-Modifier  12/31/2018   Pap smear will be done at today's visit   No results found for: TSH Lab Results  Component Value Date   WBC 11.4 (H) 07/06/2016   HGB 11.9 (L) 07/06/2016   HCT 33.8 (L) 07/06/2016   MCV 82.8 07/06/2016   PLT 171 07/06/2016   Lab Results  Component Value Date   NA 136 09/04/2018   K 3.7 09/04/2018   CO2 27 09/04/2018   GLUCOSE 103 (H) 09/04/2018   BUN 9 09/04/2018   CREATININE 0.82 09/04/2018   BILITOT 0.6 03/16/2016   ALKPHOS 40 03/16/2016   AST 18 03/16/2016   ALT 15 03/16/2016   PROT 7.0 03/16/2016   ALBUMIN 3.2 (L) 03/16/2016   CALCIUM 9.6 09/04/2018   ANIONGAP 7 09/04/2018   No results found for: CHOL No results found for: HDL No results found for: LDLCALC No results found for: TRIG No results found for: CHOLHDL No results found for: HGBA1C    Assessment & Plan:  1. Vaginal discharge Patient with vaginal discharge on exam and cervicovaginal ancillary testing done and she will be notified of the results and if further treatment is needed based on the results.  Prescription provided for metronidazole as patient reports recurrent issues with bacterial vaginitis. - Cervicovaginal ancillary only - metroNIDAZOLE (FLAGYL) 500 MG tablet; Take 1 tablet (500 mg total) by mouth 2 (two) times daily.  Dispense: 14 tablet; Refill: 0  2. Annual physical exam Annual  physical exam done at today's visit.  Pap smear performed and she will be notified of the results.  She is to return for fasting lipid panel and BMP.  Information on preventative care for ages 21-39 provided. - Cytology - PAP - Basic Metabolic Panel; Future - Lipid panel; Future  3. Encounter for Papanicolaou smear for cervical cancer screening Pap smear done at today's visit and patient will be notified of the results and if any further interventions are needed based on these results. - Cytology - PAP  4. Screening for lipid disorders She will return for fasting lipid panel and she will be notified if medication is warranted in addition to a healthy, low-fat diet. - Lipid panel; Future   An After Visit Summary was printed and given to the patient.  Follow-up: Return for yearly and as needed; also fasting labs in 1-2 weeks.   Antony Blackbird, MD

## 2019-09-13 NOTE — Patient Instructions (Signed)
Preventive Care 21-28 Years Old, Female Preventive care refers to visits with your health care provider and lifestyle choices that can promote health and wellness. This includes:  A yearly physical exam. This may also be called an annual well check.  Regular dental visits and eye exams.  Immunizations.  Screening for certain conditions.  Healthy lifestyle choices, such as eating a healthy diet, getting regular exercise, not using drugs or products that contain nicotine and tobacco, and limiting alcohol use. What can I expect for my preventive care visit? Physical exam Your health care provider will check your:  Height and weight. This may be used to calculate body mass index (BMI), which tells if you are at a healthy weight.  Heart rate and blood pressure.  Skin for abnormal spots. Counseling Your health care provider may ask you questions about your:  Alcohol, tobacco, and drug use.  Emotional well-being.  Home and relationship well-being.  Sexual activity.  Eating habits.  Work and work environment.  Method of birth control.  Menstrual cycle.  Pregnancy history. What immunizations do I need?  Influenza (flu) vaccine  This is recommended every year. Tetanus, diphtheria, and pertussis (Tdap) vaccine  You may need a Td booster every 10 years. Varicella (chickenpox) vaccine  You may need this if you have not been vaccinated. Human papillomavirus (HPV) vaccine  If recommended by your health care provider, you may need three doses over 6 months. Measles, mumps, and rubella (MMR) vaccine  You may need at least one dose of MMR. You may also need a second dose. Meningococcal conjugate (MenACWY) vaccine  One dose is recommended if you are age 19-21 years and a first-year college student living in a residence hall, or if you have one of several medical conditions. You may also need additional booster doses. Pneumococcal conjugate (PCV13) vaccine  You may need  this if you have certain conditions and were not previously vaccinated. Pneumococcal polysaccharide (PPSV23) vaccine  You may need one or two doses if you smoke cigarettes or if you have certain conditions. Hepatitis A vaccine  You may need this if you have certain conditions or if you travel or work in places where you may be exposed to hepatitis A. Hepatitis B vaccine  You may need this if you have certain conditions or if you travel or work in places where you may be exposed to hepatitis B. Haemophilus influenzae type b (Hib) vaccine  You may need this if you have certain conditions. You may receive vaccines as individual doses or as more than one vaccine together in one shot (combination vaccines). Talk with your health care provider about the risks and benefits of combination vaccines. What tests do I need?  Blood tests  Lipid and cholesterol levels. These may be checked every 5 years starting at age 20.  Hepatitis C test.  Hepatitis B test. Screening  Diabetes screening. This is done by checking your blood sugar (glucose) after you have not eaten for a while (fasting).  Sexually transmitted disease (STD) testing.  BRCA-related cancer screening. This may be done if you have a family history of breast, ovarian, tubal, or peritoneal cancers.  Pelvic exam and Pap test. This may be done every 3 years starting at age 21. Starting at age 30, this may be done every 5 years if you have a Pap test in combination with an HPV test. Talk with your health care provider about your test results, treatment options, and if necessary, the need for more tests.   Follow these instructions at home: Eating and drinking   Eat a diet that includes fresh fruits and vegetables, whole grains, lean protein, and low-fat dairy.  Take vitamin and mineral supplements as recommended by your health care provider.  Do not drink alcohol if: ? Your health care provider tells you not to drink. ? You are  pregnant, may be pregnant, or are planning to become pregnant.  If you drink alcohol: ? Limit how much you have to 0-1 drink a day. ? Be aware of how much alcohol is in your drink. In the [place] one drink equals one 12 oz bottle of beer (355 mL), one 5 oz glass of wine (148 mL), or one 1 oz glass of hard liquor (44 mL). Lifestyle  Take daily care of your teeth and gums.  Stay active. Exercise for at least 30 minutes on 5 or more days each week.  Do not use any products that contain nicotine or tobacco, such as cigarettes, e-cigarettes, and chewing tobacco. If you need help quitting, ask your health care provider.  If you are sexually active, practice safe sex. Use a condom or other form of birth control (contraception) in order to prevent pregnancy and STIs (sexually transmitted infections). If you plan to become pregnant, see your health care provider for a preconception visit. What's next?  Visit your health care provider once a year for a well check visit.  Ask your health care provider how often you should have your eyes and teeth checked.  Stay up to date on all vaccines. This information is not intended to replace advice given to you by your health care provider. Make sure you discuss any questions you have with your health care provider. Document Released: 11/22/2001 Document Revised: 06/07/2018 Document Reviewed: 06/07/2018 Elsevier Patient Education  [place].  Vaginitis  Vaginitis is irritation and swelling (inflammation) of the vagina. It happens when normal bacteria and yeast in the vagina grow too much. There are many types of this condition. Treatment will depend on the type you have. Follow these instructions at home: Lifestyle  Keep your vagina area clean and dry. ? Avoid using soap. ? Rinse the area with water.  Do not do the following until your doctor says it is okay: ? Wash and clean out the vagina (douche). ? Use tampons. ? Have sex.  Wipe from  front to back after going to the bathroom.  Let air reach your vagina. ? Wear cotton underwear. ? Do not wear: ? Underwear while you sleep. ? Tight pants. ? Thong underwear. ? Underwear or nylons without a cotton panel. ? Take off any wet clothing, such as bathing suits, as soon as possible.  Use gentle, non-scented products. Do not use things that can irritate the vagina, such as fabric softeners. Avoid the following products if they are scented: ? Feminine sprays. ? Detergents. ? Tampons. ? Feminine hygiene products. ? Soaps or bubble baths.  Practice safe sex and use condoms. General instructions  Take over-the-counter and prescription medicines only as told by your doctor.  If you were prescribed an antibiotic medicine, take or use it as told by your doctor. Do not stop taking or using the antibiotic even if you start to feel better.  Keep all follow-up visits as told by your doctor. This is important. Contact a doctor if:  You have pain in your belly.  You have a fever.  Your symptoms last for more than 2-3 days. Get help right away if:  You have a fever and your symptoms get worse all of a sudden. Summary  Vaginitis is irritation and swelling of the vagina. It can happen when the normal bacteria and yeast in the vagina grow too much. There are many types.  Treatment will depend on the type you have.  Do not douche, use tampons , or have sex until your health care provider approves. When you can return to sex, practice safe sex and use condoms. This information is not intended to replace advice given to you by your health care provider. Make sure you discuss any questions you have with your health care provider. Document Released: 12/23/2008 Document Revised: 09/08/2017 Document Reviewed: 10/18/2016 Elsevier Patient Education  2020 Elsevier Inc.  

## 2019-09-16 LAB — CERVICOVAGINAL ANCILLARY ONLY
Bacterial Vaginitis (gardnerella): POSITIVE — AB
Candida Glabrata: NEGATIVE
Candida Vaginitis: NEGATIVE
Chlamydia: NEGATIVE
Comment: NEGATIVE
Comment: NEGATIVE
Comment: NEGATIVE
Comment: NEGATIVE
Comment: NEGATIVE
Comment: NORMAL
Neisseria Gonorrhea: NEGATIVE
Trichomonas: NEGATIVE

## 2019-09-16 LAB — CYTOLOGY - PAP: Diagnosis: NEGATIVE

## 2019-09-17 ENCOUNTER — Telehealth: Payer: Self-pay | Admitting: *Deleted

## 2019-09-17 NOTE — Telephone Encounter (Signed)
Patient has an appointment scheduled for fasting blood work including BMP which would check her blood sugar as well as lipid panel.  Patient is correct and that she was told to have lab test and lab orders have already been placed so patient just needs to call to schedule fasting lab visit

## 2019-09-17 NOTE — Telephone Encounter (Signed)
Spoke with patient and she stated that provider informed her to schedule appt for her to check her sugar to see if she have diabetes. Informed patient there is nothing in her chart stating this so a message will be sent to provider about this and patient agreed.

## 2019-09-18 NOTE — Telephone Encounter (Signed)
Spoke with patient and her appt was scheduled

## 2019-09-24 ENCOUNTER — Other Ambulatory Visit: Payer: Medicaid Other

## 2019-09-27 ENCOUNTER — Other Ambulatory Visit: Payer: Medicaid Other

## 2019-09-30 ENCOUNTER — Other Ambulatory Visit: Payer: Medicaid Other

## 2019-10-01 ENCOUNTER — Other Ambulatory Visit: Payer: Medicaid Other

## 2019-11-26 ENCOUNTER — Ambulatory Visit: Payer: Medicaid Other | Attending: Internal Medicine

## 2019-11-26 DIAGNOSIS — Z20822 Contact with and (suspected) exposure to covid-19: Secondary | ICD-10-CM

## 2019-11-27 LAB — NOVEL CORONAVIRUS, NAA: SARS-CoV-2, NAA: NOT DETECTED

## 2019-12-03 ENCOUNTER — Ambulatory Visit: Payer: Medicaid Other

## 2019-12-03 ENCOUNTER — Other Ambulatory Visit: Payer: Medicaid Other

## 2019-12-04 ENCOUNTER — Other Ambulatory Visit: Payer: Self-pay

## 2019-12-04 ENCOUNTER — Ambulatory Visit: Payer: Medicaid Other

## 2019-12-04 ENCOUNTER — Ambulatory Visit: Payer: Medicaid Other | Attending: Family Medicine | Admitting: Family Medicine

## 2019-12-04 ENCOUNTER — Encounter: Payer: Self-pay | Admitting: Family Medicine

## 2019-12-04 ENCOUNTER — Other Ambulatory Visit: Payer: Medicaid Other

## 2019-12-04 VITALS — BP 129/86 | HR 97 | Temp 97.7°F | Resp 16 | Wt 282.2 lb

## 2019-12-04 DIAGNOSIS — R350 Frequency of micturition: Secondary | ICD-10-CM | POA: Diagnosis not present

## 2019-12-04 DIAGNOSIS — N3001 Acute cystitis with hematuria: Secondary | ICD-10-CM

## 2019-12-04 DIAGNOSIS — Z87891 Personal history of nicotine dependence: Secondary | ICD-10-CM | POA: Diagnosis not present

## 2019-12-04 DIAGNOSIS — Z79899 Other long term (current) drug therapy: Secondary | ICD-10-CM | POA: Diagnosis not present

## 2019-12-04 DIAGNOSIS — Z0289 Encounter for other administrative examinations: Secondary | ICD-10-CM | POA: Diagnosis not present

## 2019-12-04 DIAGNOSIS — Z111 Encounter for screening for respiratory tuberculosis: Secondary | ICD-10-CM | POA: Diagnosis not present

## 2019-12-04 DIAGNOSIS — R319 Hematuria, unspecified: Secondary | ICD-10-CM | POA: Insufficient documentation

## 2019-12-04 DIAGNOSIS — R8290 Unspecified abnormal findings in urine: Secondary | ICD-10-CM

## 2019-12-04 DIAGNOSIS — I10 Essential (primary) hypertension: Secondary | ICD-10-CM | POA: Insufficient documentation

## 2019-12-04 DIAGNOSIS — D573 Sickle-cell trait: Secondary | ICD-10-CM | POA: Diagnosis not present

## 2019-12-04 LAB — POCT GLYCOSYLATED HEMOGLOBIN (HGB A1C): HbA1c POC (<> result, manual entry): 5 %

## 2019-12-04 LAB — POCT URINALYSIS DIP (CLINITEK)
Bilirubin, UA: NEGATIVE
Glucose, UA: NEGATIVE mg/dL
Ketones, POC UA: NEGATIVE mg/dL
Nitrite, UA: NEGATIVE
POC PROTEIN,UA: NEGATIVE
Spec Grav, UA: 1.02
Urobilinogen, UA: 1 U/dL
pH, UA: 6

## 2019-12-04 MED ORDER — SULFAMETHOXAZOLE-TRIMETHOPRIM 800-160 MG PO TABS: 1.0000 | ORAL_TABLET | Freq: Two times a day (BID) | ORAL | 0 refills | Status: AC

## 2019-12-04 NOTE — Patient Instructions (Signed)
Please schedule a nurse visit for a ppd reading in 48-72 hours

## 2019-12-04 NOTE — Progress Notes (Signed)
Subjective:  Patient ID: Alexandria Fisher, female    DOB: [birth year]  Age: 29 y.o. MRN: [id number]  CC: No chief complaint on file.   HPI Alexandria Fisher, 29 yo female, last seen in the office on 09/13/2019 who presents due to possible UTI and needs employment form filled out. She reports that she has been having Increasing urinary frequency.  She denies dysuria, no abdominal pain and no urgency.  She has noticed some increased odor to the urine and possible clear vaginal discharge.  She is not concerned about sexually transmitted infections.  She reports that she is no longer taking medication for hypertension.  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  She denies any issues with her physical or mental health.  She does not take any current long-term prescription or over-the-counter medications/supplements.  She feels that she is physically and mentally capable of childcare.  She denies any anger issues, no anxiety or depression.  She reports that she never followed up to have blood work done in follow-up of prior elevated blood sugar.  Past Medical History:  Diagnosis Date  . Chlamydia   . Chronic hypertension   . Depression   . 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 Hx MRSA infection 07/14/2011  . Infection     Past Surgical History:  Procedure Laterality Date  . DILATION AND EVACUATION N/A 09/19/2013   Procedure: DILATATION AND EVACUATION;  Surgeon: Guss Bunde, MD;  Location: [facility];  Service: Gynecology;  Laterality: N/A;  . TONSILLECTOMY N/A 09/12/2018   Procedure: TONSILLECTOMY;  Surgeon: Helayne Seminole, MD;  Location: [facility];  Service: ENT;  Laterality: N/A;    Family History  Problem Relation Age of Onset  . Breast cancer Maternal Grandmother   . Diabetes Maternal Grandmother   . Anemia Mother   . Hypertension Mother   . Stroke Mother     Social History   Tobacco Use  . Smoking status: Former Smoker    Packs/day:  0.10    Types: Cigarettes    Quit date: 11/05/2015    Years since quitting: 4.0  . Smokeless tobacco: Former Network engineer Use Topics  . Alcohol use: Yes    Comment: occasionally     ROS Review of Systems  Constitutional: Negative for chills and fever.  HENT: Negative for sore throat and trouble swallowing.   Respiratory: Negative for cough and shortness of breath.   Cardiovascular: Negative for chest pain and palpitations.  Gastrointestinal: Negative for abdominal pain, blood in stool, constipation, diarrhea and nausea.  Endocrine: Positive for polyuria. Negative for polydipsia and polyphagia.  Genitourinary: Positive for frequency. Negative for dysuria.  Musculoskeletal: Positive for back pain. Negative for arthralgias.  Neurological: Negative for dizziness and headaches.  Hematological: Negative for adenopathy. Does not bruise/bleed easily.  Psychiatric/Behavioral: Negative for self-injury and suicidal ideas. The patient is not nervous/anxious.     Objective:   Today's Vitals: BP 129/86   Pulse 97   Temp 97.7 F (36.5 C)   Resp 16   Wt 282 lb 3.2 oz (128 kg)   SpO2 97%   BMI 45.55 kg/m   Physical Exam Vitals and nursing note reviewed.  Constitutional:      General: She is not in acute distress.    Appearance: Normal appearance. She is obese.  Cardiovascular:     Rate and Rhythm: Normal rate and regular rhythm.  Pulmonary:     Effort: Pulmonary effort is  normal.     Breath sounds: Normal breath sounds.  Abdominal:     Palpations: Abdomen is soft.     Tenderness: There is no abdominal tenderness. There is right CVA tenderness. There is no left CVA tenderness, guarding or rebound.     Comments: Truncal obesity  Skin:    General: Skin is warm and dry.  Neurological:     General: No focal deficit present.     Mental Status: She is alert and oriented to person, place, and time.  Psychiatric:        Mood and Affect: Mood normal.        Behavior: Behavior normal.      Assessment & Plan:  1. Encounter for PPD test; 4.  Encounter for completion of form with patient Patient was on the schedule for PPD which was placed at today's visit.  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  She denies any mental or physical condition that would prohibit her work with children.  She is not currently on any medications.  Form was completed and copy of form made that will be scanned into patient's chart.  Patient was made aware by CMA of when she will need to return for reading of PPD - PPD  2. Urinary frequency Patient with complaint of urinary frequency and also reports that she never returned to have blood work done in follow-up of prior elevated blood sugar.  Patient will have urinalysis at today's visit to look for possible urinary tract infection and follow-up of her complaint of frequent urination, right CVA tenderness on exam and a strong odor to the urine.  We will also check hemoglobin A1c to look for prediabetes/diabetes/hyperglycemia as a cause or contributing factor to her urinary frequency. - Urine Culture - POCT URINALYSIS DIP (CLINITEK) - HgB A1c  3. Acute cystitis with hematuria Patient with abnormal urinalysis with large amount of leukocytes and trace blood.  She will be placed on Septra DS x3 days for treatment of urinary tract infection and urine will be sent for culture.  She is encouraged to increase water intake.  She will be notified if a change in antibiotic therapy is needed based on her culture results. - Urine Culture - sulfamethoxazole-trimethoprim (BACTRIM DS) 800-160 MG tablet; Take 1 tablet by mouth 2 (two) times daily for 3 days.  Dispense: 6 tablet; Refill: 0    Outpatient Encounter Medications as of 12/04/2019  Medication Sig  . hydrochlorothiazide (HYDRODIURIL) 25 MG tablet Take 1 tablet (25 mg total) by mouth daily. MUST MAKE APPT FOR FURTHER REFILLS (Patient not taking: Reported on 05/08/2019)  .  metroNIDAZOLE (FLAGYL) 500 MG tablet Take 1 tablet (500 mg total) by mouth 2 (two) times daily.   No facility-administered encounter medications on file as of 12/04/2019.    An After Visit Summary was printed and given to the patient.   Follow-up: Return for 1-2 weeks if continued symptoms; yearly well exam when needed.   Antony Blackbird MD

## 2019-12-06 ENCOUNTER — Other Ambulatory Visit: Payer: Self-pay

## 2019-12-06 ENCOUNTER — Ambulatory Visit: Payer: Medicaid Other | Attending: Family Medicine

## 2019-12-06 DIAGNOSIS — Z111 Encounter for screening for respiratory tuberculosis: Secondary | ICD-10-CM

## 2019-12-06 LAB — TB SKIN TEST
Induration: 0 mm
TB Skin Test: NEGATIVE

## 2019-12-06 LAB — URINE CULTURE: Organism ID, Bacteria: NO GROWTH

## 2019-12-06 NOTE — Progress Notes (Signed)
Pt is here for PPD reading/ test negative/ 0 induration/ Form signed and given to the pt / results in the chart

## 2019-12-11 ENCOUNTER — Other Ambulatory Visit: Payer: Self-pay | Admitting: Family Medicine

## 2019-12-11 ENCOUNTER — Other Ambulatory Visit: Payer: Self-pay | Admitting: Obstetrics and Gynecology

## 2019-12-11 DIAGNOSIS — I10 Essential (primary) hypertension: Secondary | ICD-10-CM

## 2019-12-12 ENCOUNTER — Other Ambulatory Visit: Payer: Medicaid Other

## 2019-12-23 ENCOUNTER — Other Ambulatory Visit: Payer: Self-pay | Admitting: Family Medicine

## 2019-12-23 DIAGNOSIS — I10 Essential (primary) hypertension: Secondary | ICD-10-CM

## 2020-02-09 NOTE — Progress Notes (Signed)
Patient did not show for appointment.   

## 2020-02-10 ENCOUNTER — Ambulatory Visit (HOSPITAL_BASED_OUTPATIENT_CLINIC_OR_DEPARTMENT_OTHER): Payer: Medicaid Other | Admitting: Family

## 2020-02-10 DIAGNOSIS — Z5329 Procedure and treatment not carried out because of patient's decision for other reasons: Secondary | ICD-10-CM

## 2020-02-28 ENCOUNTER — Ambulatory Visit: Payer: Medicaid Other | Attending: Family Medicine | Admitting: Family Medicine

## 2020-02-28 ENCOUNTER — Other Ambulatory Visit (HOSPITAL_COMMUNITY)
Admission: RE | Admit: 2020-02-28 | Discharge: 2020-02-28 | Disposition: A | Discharge status: Home / Self Care | Payer: Medicaid Other | Source: Ambulatory Visit | Attending: Family Medicine | Admitting: Family Medicine

## 2020-02-28 ENCOUNTER — Encounter: Payer: Self-pay | Admitting: Family Medicine

## 2020-02-28 ENCOUNTER — Other Ambulatory Visit: Payer: Self-pay

## 2020-02-28 VITALS — BP 128/82 | HR 93 | Temp 98.1°F | Ht 66.0 in | Wt 280.0 lb

## 2020-02-28 DIAGNOSIS — N898 Other specified noninflammatory disorders of vagina: Secondary | ICD-10-CM

## 2020-02-28 DIAGNOSIS — Z0000 Encounter for general adult medical examination without abnormal findings: Secondary | ICD-10-CM | POA: Diagnosis not present

## 2020-02-28 DIAGNOSIS — R1013 Epigastric pain: Secondary | ICD-10-CM | POA: Diagnosis not present

## 2020-02-28 DIAGNOSIS — K219 Gastro-esophageal reflux disease without esophagitis: Secondary | ICD-10-CM

## 2020-02-28 DIAGNOSIS — Z6841 Body Mass Index (BMI) 40.0 and over, adult: Secondary | ICD-10-CM

## 2020-02-28 MED ORDER — OMEPRAZOLE 40 MG PO CPDR: 40.0000 mg | DELAYED_RELEASE_CAPSULE | Freq: Every day | ORAL | 3 refills | Status: DC

## 2020-02-28 NOTE — Patient Instructions (Signed)
Health Maintenance, Female Adopting a healthy lifestyle and getting preventive care are important in promoting health and wellness. Ask your health care provider about:  The right schedule for you to have regular tests and exams.  Things you can do on your own to prevent diseases and keep yourself healthy. What should I know about diet, weight, and exercise? Eat a healthy diet   Eat a diet that includes plenty of vegetables, fruits, low-fat dairy products, and lean protein.  Do not eat a lot of foods that are high in solid fats, added sugars, or sodium. Maintain a healthy weight Body mass index (BMI) is used to identify weight problems. It estimates body fat based on height and weight. Your health care provider can help determine your BMI and help you achieve or maintain a healthy weight. Get regular exercise Get regular exercise. This is one of the most important things you can do for your health. Most adults should:  Exercise for at least 150 minutes each week. The exercise should increase your heart rate and make you sweat (moderate-intensity exercise).  Do strengthening exercises at least twice a week. This is in addition to the moderate-intensity exercise.  Spend less time sitting. Even light physical activity can be beneficial. Watch cholesterol and blood lipids Have your blood tested for lipids and cholesterol at 29 years of age, then have this test every 5 years. Have your cholesterol levels checked more often if:  Your lipid or cholesterol levels are high.  You are older than 29 years of age.  You are at high risk for heart disease. What should I know about cancer screening? Depending on your health history and family history, you may need to have cancer screening at various ages. This may include screening for:  Breast cancer.  Cervical cancer.  Colorectal cancer.  Skin cancer.  Lung cancer. What should I know about heart disease, diabetes, and high blood  pressure? Blood pressure and heart disease  High blood pressure causes heart disease and increases the risk of stroke. This is more likely to develop in people who have high blood pressure readings, are of African descent, or are overweight.  Have your blood pressure checked: ? Every 3-5 years if you are 18-39 years of age. ? Every year if you are 40 years old or older. Diabetes Have regular diabetes screenings. This checks your fasting blood sugar level. Have the screening done:  Once every three years after age 40 if you are at a normal weight and have a low risk for diabetes.  More often and at a younger age if you are overweight or have a high risk for diabetes. What should I know about preventing infection? Hepatitis B If you have a higher risk for hepatitis B, you should be screened for this virus. Talk with your health care provider to find out if you are at risk for hepatitis B infection. Hepatitis C Testing is recommended for:  Everyone born from 1945 through 1965.  Anyone with known risk factors for hepatitis C. Sexually transmitted infections (STIs)  Get screened for STIs, including gonorrhea and chlamydia, if: ? You are sexually active and are younger than 29 years of age. ? You are older than 29 years of age and your health care provider tells you that you are at risk for this type of infection. ? Your sexual activity has changed since you were last screened, and you are at increased risk for chlamydia or gonorrhea. Ask your health care provider if   you are at risk.  Ask your health care provider about whether you are at high risk for HIV. Your health care provider may recommend a prescription medicine to help prevent HIV infection. If you choose to take medicine to prevent HIV, you should first get tested for HIV. You should then be tested every 3 months for as long as you are taking the medicine. Pregnancy  If you are about to stop having your period (premenopausal) and  you may become pregnant, seek counseling before you get pregnant.  Take 400 to 800 micrograms (mcg) of folic acid every day if you become pregnant.  Ask for birth control (contraception) if you want to prevent pregnancy. Osteoporosis and menopause Osteoporosis is a disease in which the bones lose minerals and strength with aging. This can result in bone fractures. If you are 65 years old or older, or if you are at risk for osteoporosis and fractures, ask your health care provider if you should:  Be screened for bone loss.  Take a calcium or vitamin D supplement to lower your risk of fractures.  Be given hormone replacement therapy (HRT) to treat symptoms of menopause. Follow these instructions at home: Lifestyle  Do not use any products that contain nicotine or tobacco, such as cigarettes, e-cigarettes, and chewing tobacco. If you need help quitting, ask your health care provider.  Do not use street drugs.  Do not share needles.  Ask your health care provider for help if you need support or information about quitting drugs. Alcohol use  Do not drink alcohol if: ? Your health care provider tells you not to drink. ? You are pregnant, may be pregnant, or are planning to become pregnant.  If you drink alcohol: ? Limit how much you use to 0-1 drink a day. ? Limit intake if you are breastfeeding.  Be aware of how much alcohol is in your drink. In the [place] one drink equals one 12 oz bottle of beer (355 mL), one 5 oz glass of wine (148 mL), or one 1 oz glass of hard liquor (44 mL). General instructions  Schedule regular health, dental, and eye exams.  Stay current with your vaccines.  Tell your health care provider if: ? You often feel depressed. ? You have ever been abused or do not feel safe at home. Summary  Adopting a healthy lifestyle and getting preventive care are important in promoting health and wellness.  Follow your health care provider's instructions about healthy  diet, exercising, and getting tested or screened for diseases.  Follow your health care provider's instructions on monitoring your cholesterol and blood pressure. This information is not intended to replace advice given to you by your health care provider. Make sure you discuss any questions you have with your health care provider. Document Revised: 09/19/2018 Document Reviewed: 09/19/2018 Elsevier Patient Education  2020 Elsevier Inc.  

## 2020-02-28 NOTE — Progress Notes (Signed)
Subjective:  Patient ID: Alexandria Fisher, female    DOB: October 01, 1991  Age: 29 y.o. MRN: [id number]  CC: No chief complaint on file.   HPI Alexandria Fisher, 29 yo female, who presents for her annual well exam.  Her last Pap smear was done in December 2020 and was normal at that time.  She denies any current lower abdominal, pelvic or vaginal pain.  She additionally has complaint of a thin, light yellow vaginal discharge with an odor. No vaginal or pelvic pain.  She has not been sexually active in awhile. She also has complaint of worsening of acid reflux especially at night after lying down. She is no longer taking BP medication. She was shocked to see that her weight is increased and she would like to lose weight.   Past Medical History:  Diagnosis Date  . Chlamydia   . Chronic hypertension   . Depression   . 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 Hx MRSA infection 07/14/2011  . Infection     Past Surgical History:  Procedure Laterality Date  . DILATION AND EVACUATION N/A 09/19/2013   Procedure: DILATATION AND EVACUATION;  Surgeon: Guss Bunde, MD;  Location: [facility];  Service: Gynecology;  Laterality: N/A;  . TONSILLECTOMY N/A 09/12/2018   Procedure: TONSILLECTOMY;  Surgeon: Helayne Seminole, MD;  Location: [facility];  Service: ENT;  Laterality: N/A;    Family History  Problem Relation Age of Onset  . Breast cancer Maternal Grandmother   . Diabetes Maternal Grandmother   . Anemia Mother   . Hypertension Mother   . Stroke Mother     Social History   Tobacco Use  . Smoking status: Former Smoker    Packs/day: 0.10    Types: Cigarettes    Quit date: 11/05/2015    Years since quitting: 4.3  . Smokeless tobacco: Former Network engineer Use Topics  . Alcohol use: Yes    Comment: occasionally     ROS Review of Systems  Constitutional: Positive for fatigue. Negative for chills and fever.  HENT: Negative for sore throat and trouble swallowing.     Eyes: Negative for photophobia and visual disturbance.  Respiratory: Negative for cough and shortness of breath.   Cardiovascular: Negative for chest pain, palpitations and leg swelling.  Gastrointestinal: Positive for abdominal pain and nausea. Negative for blood in stool, constipation and diarrhea.  Endocrine: Negative for cold intolerance, heat intolerance, polydipsia, polyphagia and polyuria.  Genitourinary: Negative for dysuria and frequency.  Musculoskeletal: Negative for arthralgias and back pain.  Neurological: Negative for dizziness and headaches.  Hematological: Negative for adenopathy. Does not bruise/bleed easily.    Objective:   Today's Vitals: BP 128/82   Pulse 93   Temp 98.1 F (36.7 C) (Temporal)   Ht 5\' 6"  (1.676 m)   Wt 280 lb (127 kg)   LMP 02/16/2020 (Exact Date)   SpO2 100%   BMI 45.19 kg/m   Physical Exam Vitals and nursing note reviewed.  Constitutional:      General: She is not in acute distress.    Appearance: Normal appearance. She is obese.  Cardiovascular:     Rate and Rhythm: Normal rate and regular rhythm.  Pulmonary:     Effort: Pulmonary effort is normal.     Breath sounds: Normal breath sounds.  Abdominal:     Palpations: Abdomen is soft.     Tenderness: There is no abdominal tenderness. There is no right CVA tenderness, left  CVA tenderness, guarding or rebound.  Musculoskeletal:        General: No tenderness.     Cervical back: Normal range of motion and neck supple. No tenderness.     Right lower leg: No edema.     Left lower leg: No edema.  Lymphadenopathy:     Cervical: No cervical adenopathy.  Skin:    General: Skin is warm and dry.  Neurological:     General: No focal deficit present.     Mental Status: She is alert.     Cranial Nerves: No cranial nerve deficit.  Psychiatric:        Mood and Affect: Mood normal.        Behavior: Behavior normal.     Assessment & Plan:  1. Annual physical exam Annual physical  examination performed at today's visit.  Patient has had Pap smear done 09/13/2019 which was normal.  Patient did request testing due to recent vaginal discharge.  She denies any recent sexual activity and denies any current pregnancy.  Health maintenance/preventative care discussed with the patient.  As she is also obese, referral has been made for patient to attend a medical weight loss program.  She may schedule appointment for fasting blood work at her convenience.  She was provided with information on health maintenance for females as part of AVS.  2. Gastroesophageal reflux disease, unspecified whether esophagitis present She reports issues with gastric reflux and prescription provided for omeprazole 40 mg daily.  She is encouraged to avoid use of known trigger foods as well as avoidance of eating within 2 hours of bedtime. - omeprazole (PRILOSEC) 40 MG capsule; Take 1 capsule (40 mg total) by mouth daily. To decrease stomach acid  Dispense: 60 capsule; Refill: 3  3. Epigastric discomfort Patient with mild epigastric discomfort on examination which I suspect is related to acid reflux/GERD.  Prescription for omeprazole 40 mg daily but call or return if symptoms are not improving after at least 2 weeks of treatment.  If any acute worsening of epigastric pain, please go to the emergency department if this occurs during nonclinical hours otherwise called to see if office appointment available for reevaluation. - omeprazole (PRILOSEC) 40 MG capsule; Take 1 capsule (40 mg total) by mouth daily. To decrease stomach acid  Dispense: 60 capsule; Refill: 3  4. Vaginal discharge Patient performed cervicovaginal ancillary self swab at today's visit.  She will be notified of the results and if any further treatment is needed based on the results. - Cervicovaginal ancillary only  5. Morbid obesity with BMI of 45.0-49.9, adult [facility]) Patient with BMI of 45 consistent with morbid obesity for which she will be  referred to a medical weight loss program for further evaluation and treatment. - Amb Ref to Medical Weight Management  Outpatient Encounter Medications as of 02/28/2020  Medication Sig  . hydrochlorothiazide (HYDRODIURIL) 25 MG tablet Take 1 tablet (25 mg total) by mouth daily.  Marland Kitchen omeprazole (PRILOSEC) 40 MG capsule Take 1 capsule (40 mg total) by mouth daily. To decrease stomach acid   No facility-administered encounter medications on file as of 02/28/2020.    An After Visit Summary was printed and given to the patient.   Follow-up: Return in about 6 weeks (around 04/10/2020) for GERD.    Antony Blackbird MD

## 2020-02-28 NOTE — Progress Notes (Signed)
CPE   Med refills

## 2020-03-02 ENCOUNTER — Other Ambulatory Visit: Payer: Self-pay | Admitting: Family Medicine

## 2020-03-02 DIAGNOSIS — B9689 Other specified bacterial agents as the cause of diseases classified elsewhere: Secondary | ICD-10-CM

## 2020-03-02 DIAGNOSIS — N760 Acute vaginitis: Secondary | ICD-10-CM

## 2020-03-02 LAB — CERVICOVAGINAL ANCILLARY ONLY
Bacterial Vaginitis (gardnerella): POSITIVE — AB
Candida Glabrata: NEGATIVE
Candida Vaginitis: NEGATIVE
Chlamydia: NEGATIVE
Comment: NEGATIVE
Comment: NEGATIVE
Comment: NEGATIVE
Comment: NEGATIVE
Comment: NEGATIVE
Comment: NORMAL
Neisseria Gonorrhea: NEGATIVE
Trichomonas: NEGATIVE

## 2020-03-02 MED ORDER — METRONIDAZOLE 500 MG PO TABS: 500.0000 mg | ORAL_TABLET | Freq: Two times a day (BID) | ORAL | 0 refills | Status: AC

## 2020-03-19 ENCOUNTER — Telehealth: Payer: Self-pay | Admitting: Family Medicine

## 2020-03-19 NOTE — Telephone Encounter (Signed)
Does patient need an OV ? 

## 2020-03-19 NOTE — Telephone Encounter (Signed)
Patient called and requested for listed medication to be refilled. Patient stated that she did not complete the medication. Patient stated she is still having the same symptoms. Please follow up at your earliest convenience. Patient was offered and appointment but patient declined and stated that it was too far away.   metroNIDAZOLE (FLAGYL) 500 MG tablet [[id number]] ENDED [place], [place]  [address], [place] [address]

## 2020-03-20 ENCOUNTER — Other Ambulatory Visit: Payer: Self-pay | Admitting: Family Medicine

## 2020-03-20 DIAGNOSIS — I10 Essential (primary) hypertension: Secondary | ICD-10-CM

## 2020-03-20 MED ORDER — METRONIDAZOLE 500 MG PO TABS
500.0000 mg | ORAL_TABLET | Freq: Two times a day (BID) | ORAL | 0 refills | Status: DC
Start: 2020-03-20 — End: 2020-11-30

## 2020-03-20 NOTE — Telephone Encounter (Signed)
Patient was called and informed of medication being refilled .

## 2020-03-20 NOTE — Telephone Encounter (Signed)
I have sent the prescription to her pharmacy.

## 2020-03-27 ENCOUNTER — Other Ambulatory Visit: Payer: Self-pay | Admitting: Family Medicine

## 2020-03-27 DIAGNOSIS — I10 Essential (primary) hypertension: Secondary | ICD-10-CM

## 2020-04-10 ENCOUNTER — Ambulatory Visit (INDEPENDENT_AMBULATORY_CARE_PROVIDER_SITE_OTHER): Payer: Medicaid Other | Admitting: *Deleted

## 2020-04-10 ENCOUNTER — Other Ambulatory Visit: Payer: Self-pay

## 2020-04-10 ENCOUNTER — Other Ambulatory Visit (HOSPITAL_COMMUNITY)
Admission: RE | Admit: 2020-04-10 | Discharge: 2020-04-10 | Disposition: A | Discharge status: Home / Self Care | Payer: Medicaid Other | Source: Ambulatory Visit | Attending: Family Medicine | Admitting: Family Medicine

## 2020-04-10 ENCOUNTER — Encounter: Payer: Self-pay | Admitting: *Deleted

## 2020-04-10 VITALS — BP 144/88 | HR 100 | Ht 66.0 in | Wt 278.1 lb

## 2020-04-10 DIAGNOSIS — N898 Other specified noninflammatory disorders of vagina: Secondary | ICD-10-CM

## 2020-04-10 NOTE — Progress Notes (Signed)
Chart reviewed for nurse visit. Agree with plan of care.   Luvenia Redden, PA-C 04/10/2020 9:40 AM

## 2020-04-10 NOTE — Progress Notes (Signed)
Pt states she was recently treated for BV with Metronidazole and states the sx went away but then returned. She now has vaginal itching, discharge and slight irritation x1 week. Pt is sexually active. Self swab obtained and pt advised she will be notified of results via [place]. She voiced understanding of information given.

## 2020-04-14 ENCOUNTER — Other Ambulatory Visit: Payer: Self-pay | Admitting: Medical

## 2020-04-14 DIAGNOSIS — B3731 Acute candidiasis of vulva and vagina: Secondary | ICD-10-CM

## 2020-04-14 LAB — CERVICOVAGINAL ANCILLARY ONLY
Bacterial Vaginitis (gardnerella): NEGATIVE
Candida Glabrata: NEGATIVE
Candida Vaginitis: POSITIVE — AB
Chlamydia: NEGATIVE
Comment: NEGATIVE
Comment: NEGATIVE
Comment: NEGATIVE
Comment: NEGATIVE
Comment: NEGATIVE
Comment: NORMAL
Neisseria Gonorrhea: NEGATIVE
Trichomonas: NEGATIVE

## 2020-04-14 MED ORDER — FLUCONAZOLE 150 MG PO TABS: 150.0000 mg | ORAL_TABLET | Freq: Every day | ORAL | 0 refills | Status: DC

## 2020-04-15 ENCOUNTER — Ambulatory Visit: Payer: Medicaid Other | Admitting: Family Medicine

## 2020-04-27 ENCOUNTER — Other Ambulatory Visit: Payer: Self-pay | Admitting: Family Medicine

## 2020-04-27 ENCOUNTER — Other Ambulatory Visit: Payer: Self-pay

## 2020-04-27 ENCOUNTER — Ambulatory Visit: Payer: Medicaid Other | Attending: Family Medicine | Admitting: Family

## 2020-04-27 DIAGNOSIS — Z91199 Patient's noncompliance with other medical treatment and regimen due to unspecified reason: Secondary | ICD-10-CM

## 2020-04-27 DIAGNOSIS — Z5329 Procedure and treatment not carried out because of patient's decision for other reasons: Secondary | ICD-10-CM

## 2020-04-27 DIAGNOSIS — I10 Essential (primary) hypertension: Secondary | ICD-10-CM

## 2020-04-27 NOTE — Telephone Encounter (Signed)
Requested medication (s) are due for refill today: yes  Requested medication (s) are on the active medication list: yes  Last refill:  03/27/20  Future visit scheduled: yes  Notes to clinic:  pt already given courtesy RF - labs out of date- please advise   Requested Prescriptions  Pending Prescriptions Disp Refills   hydrochlorothiazide (HYDRODIURIL) 25 MG tablet [Pharmacy Med Name: hydrochlorothiazide 25 mg tablet] 30 tablet 1    Sig: Take 1 tablet (25 mg total) by mouth daily.      Cardiovascular: Diuretics - Thiazide Failed - 04/27/2020 10:59 AM      Failed - Ca in normal range and within 360 days    Calcium  Date Value Ref Range Status  09/04/2018 9.6 8.9 - 10.3 mg/dL Final          Failed - Cr in normal range and within 360 days    Creat  Date Value Ref Range Status  12/31/2015 0.70 0.50 - 1.10 mg/dL Final   Creatinine, Ser  Date Value Ref Range Status  09/04/2018 0.82 0.44 - 1.00 mg/dL Final   Creatinine, Urine  Date Value Ref Range Status  12/31/2015 157.76 >20.0 mg/dL Final  12/31/2015 157 20 - 320 mg/dL Final          Failed - K in normal range and within 360 days    Potassium  Date Value Ref Range Status  09/04/2018 3.7 3.5 - 5.1 mmol/L Final          Failed - Na in normal range and within 360 days    Sodium  Date Value Ref Range Status  09/04/2018 136 135 - 145 mmol/L Final          Failed - Last BP in normal range    BP Readings from Last 1 Encounters:  04/10/20 (!) 144/88          Passed - Valid encounter within last 6 months    Recent Outpatient Visits           Today No-show for appointment   [place], [place], NP   1 month ago Annual physical exam   [place], [place], MD   2 months ago No-show for appointment   [place], [place], NP   4 months ago Encounter for PPD test   [place], MD   7 months ago Vaginal discharge   [place], [place], MD       Future Appointments             In 3 weeks Camillia Herter, NP [place]

## 2020-04-27 NOTE — Progress Notes (Signed)
Patient did not show for appointment.   

## 2020-05-18 ENCOUNTER — Ambulatory Visit: Payer: Medicaid Other | Attending: Family | Admitting: Family

## 2020-05-18 DIAGNOSIS — Z91199 Patient's noncompliance with other medical treatment and regimen due to unspecified reason: Secondary | ICD-10-CM

## 2020-05-18 DIAGNOSIS — Z5329 Procedure and treatment not carried out because of patient's decision for other reasons: Secondary | ICD-10-CM

## 2020-05-18 NOTE — Progress Notes (Signed)
Patient did not show for appointment . This nurse practitioner called patient 3 times with no response. Voicemail was unavailable.

## 2020-11-25 ENCOUNTER — Ambulatory Visit: Payer: Medicaid Other | Admitting: Physician Assistant

## 2020-11-26 ENCOUNTER — Ambulatory Visit: Payer: Medicaid Other | Attending: Physician Assistant | Admitting: Physician Assistant

## 2020-11-26 ENCOUNTER — Other Ambulatory Visit: Payer: Self-pay

## 2020-11-26 ENCOUNTER — Other Ambulatory Visit (HOSPITAL_COMMUNITY)
Admission: RE | Admit: 2020-11-26 | Discharge: 2020-11-26 | Disposition: A | Discharge status: Home / Self Care | Payer: Medicaid Other | Source: Ambulatory Visit | Attending: Physician Assistant | Admitting: Physician Assistant

## 2020-11-26 ENCOUNTER — Encounter: Payer: Self-pay | Admitting: Physician Assistant

## 2020-11-26 VITALS — BP 120/82 | HR 95 | Ht 66.0 in | Wt 284.0 lb

## 2020-11-26 DIAGNOSIS — N898 Other specified noninflammatory disorders of vagina: Secondary | ICD-10-CM | POA: Diagnosis present

## 2020-11-26 DIAGNOSIS — Z131 Encounter for screening for diabetes mellitus: Secondary | ICD-10-CM

## 2020-11-26 DIAGNOSIS — R1013 Epigastric pain: Secondary | ICD-10-CM

## 2020-11-26 DIAGNOSIS — K219 Gastro-esophageal reflux disease without esophagitis: Secondary | ICD-10-CM

## 2020-11-26 DIAGNOSIS — R1011 Right upper quadrant pain: Secondary | ICD-10-CM | POA: Insufficient documentation

## 2020-11-26 MED ORDER — OMEPRAZOLE 40 MG PO CPDR: 40.0000 mg | DELAYED_RELEASE_CAPSULE | Freq: Every day | ORAL | 3 refills | Status: DC

## 2020-11-26 NOTE — Progress Notes (Signed)
Patient ID: Alexandria Fisher, female   DOB: 07/29/1991, 30 y.o.   MRN: [id number]    Alexandria Fisher, is a 30 y.o. female  [id number]  [id number]  DOB - May 04, 1991  Subjective:  Chief Complaint and HPI: Alexandria Fisher is a 30 y.o. female here today for a few issues.  Needs omeprazole RF.    Also c/o midepigastric pain that is intermittent.  Sometimes in LUQ, sometimes in RUQ.  Comes and goes.  Sometimes sharp.  Fleeting.  Occasional nausea.  No constipation/diarrhea. No fever. No melena or hematochezia.  Wants diabetes screening.   Also c/o 1-2 weeks vaginal discharge.  No fever.  No pelvic pain.  Had abortion in November, periods back to normal and has recheck 11/30/2020.  Minimal itching.  No burning.  Not SA in a couple of months now.  No urinary s/sx  ED/Hospital notes reviewed.     ROS:   Constitutional:  No f/c, No night sweats, No unexplained weight loss. EENT:  No vision changes, No blurry vision, No hearing changes. No mouth, throat, or ear problems.  Respiratory: No cough, No SOB Cardiac: No CP, no palpitations GI:  See above GU: No Urinary s/sx Musculoskeletal: No joint pain Neuro: No headache, no dizziness, no motor weakness.  Skin: No rash Endocrine:  No polydipsia. No polyuria.  Psych: Denies SI/HI  Problem  Ruq Pain    ALLERGIES: No Known Allergies  PAST MEDICAL HISTORY: Past Medical History:  Diagnosis Date  . Chlamydia   . Chronic hypertension   . Depression   . 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 Hx MRSA infection 07/14/2011  . Infection     MEDICATIONS AT HOME: Prior to Admission medications   Medication Sig Start Date End Date Taking? Authorizing Provider  fluconazole (DIFLUCAN) 150 MG tablet Take 1 tablet (150 mg total) by mouth daily. Patient not taking: Reported on 11/26/2020 04/14/20   Luvenia Redden, PA-C  metroNIDAZOLE (FLAGYL) 500 MG tablet Take 1 tablet (500 mg total) by mouth 2 (two) times daily. Patient not taking: No  sig reported 03/20/20   Charlott Rakes, MD  omeprazole (PRILOSEC) 40 MG capsule Take 1 capsule (40 mg total) by mouth daily. To decrease stomach acid 11/26/20   Argentina Donovan, PA-C     Objective:  EXAM:   Vitals:   11/26/20 1110  BP: 120/82  Pulse: 95  SpO2: 100%  Weight: 284 lb (128.8 kg)  Height: 5\' 6"  (1.676 m)    General appearance : A&OX3. NAD. Non-toxic-appearing HEENT: Atraumatic and Normocephalic.  PERRLA. EOM intact.  Neck: supple, no JVD. No cervical lymphadenopathy. No thyromegaly Chest/Lungs:  Breathing-non-labored, Good air entry bilaterally, breath sounds normal without rales, rhonchi, or wheezing  CVS: S1 S2 regular, no murmurs, gallops, rubs  Abdomen: Bowel sounds present, abdomen is obese and Non tender and not distended with no gaurding, rigidity or rebound. Extremities: Bilateral Lower Ext shows no edema, both legs are warm to touch with = pulse throughout Neurology:  CN II-XII grossly intact, Non focal.   Psych:  TP linear. J/I WNL. Normal speech. Appropriate eye contact and affect.  Skin:  No Rash  Data Review Lab Results  Component Value Date   HGBA1C 5.0 12/04/2019     Assessment & Plan   1. Vaginal discharge Denies STD risk factors.  Keep AB f/up appt - Cervicovaginal ancillary only  2. Gastroesophageal reflux disease, unspecified whether esophagitis present - omeprazole (PRILOSEC) 40 MG capsule; Take 1 capsule (40 mg total)  by mouth daily. To decrease stomach acid  Dispense: 60 capsule; Refill: 3  3. Epigastric discomfort No red flags - omeprazole (PRILOSEC) 40 MG capsule; Take 1 capsule (40 mg total) by mouth daily. To decrease stomach acid  Dispense: 60 capsule; Refill: 3 - H. pylori breath test - Comprehensive metabolic panel - CBC with Differential/Platelet  4. Screening for diabetes mellitus I have had a lengthy discussion and provided education about insulin resistance and the intake of too much sugar/refined carbohydrates.  I have  advised the patient to work at a goal of eliminating sugary drinks, candy, desserts, sweets, refined sugars, processed foods, and white carbohydrates.  The patient expresses understanding.  - Hemoglobin A1c     Patient have been counseled extensively about nutrition and exercise  Return in about 6 months (around 05/26/2021) for assign new PCP.  The patient was given clear instructions to go to ER or return to medical center if symptoms don't improve, worsen or new problems develop. The patient verbalized understanding. The patient was told to call to get lab results if they haven't heard anything in the next week.     Freeman Caldron, PA-C [facility] and [facility] [place], [place]   11/26/2020, 11:30 AM

## 2020-11-27 LAB — COMPREHENSIVE METABOLIC PANEL
ALT: 18 IU/L (ref 0–32)
AST: 19 IU/L (ref 0–40)
Albumin/Globulin Ratio: 1.2 (ref 1.2–2.2)
Albumin: 4.1 g/dL (ref 3.9–5.0)
Alkaline Phosphatase: 67 IU/L (ref 44–121)
BUN/Creatinine Ratio: 12 (ref 9–23)
BUN: 10 mg/dL (ref 6–20)
Bilirubin Total: <0.2 mg/dL (ref 0.0–1.2)
CO2: 22 mmol/L (ref 20–29)
Calcium: 9.1 mg/dL (ref 8.7–10.2)
Chloride: 102 mmol/L (ref 96–106)
Creatinine, Ser: 0.82 mg/dL (ref 0.57–1.00)
GFR calc Af Amer: 112 mL/min/{1.73_m2} (ref >59)
GFR calc non Af Amer: 97 mL/min/{1.73_m2} (ref >59)
Globulin, Total: 3.4 g/dL (ref 1.5–4.5)
Glucose: 96 mg/dL (ref 65–99)
Potassium: 4.3 mmol/L (ref 3.5–5.2)
Sodium: 139 mmol/L (ref 134–144)
Total Protein: 7.5 g/dL (ref 6.0–8.5)

## 2020-11-27 LAB — CERVICOVAGINAL ANCILLARY ONLY
Bacterial Vaginitis (gardnerella): POSITIVE — AB
Candida Glabrata: NEGATIVE
Candida Vaginitis: NEGATIVE
Chlamydia: NEGATIVE
Comment: NEGATIVE
Comment: NEGATIVE
Comment: NEGATIVE
Comment: NEGATIVE
Comment: NEGATIVE
Comment: NORMAL
Neisseria Gonorrhea: NEGATIVE
Trichomonas: NEGATIVE

## 2020-11-27 LAB — H. PYLORI BREATH TEST: H pylori Breath Test: NEGATIVE

## 2020-11-27 LAB — HEMOGLOBIN A1C
Est. average glucose Bld gHb Est-mCnc: 103 mg/dL
Hgb A1c MFr Bld: 5.2 % (ref 4.8–5.6)

## 2020-11-27 LAB — CBC WITH DIFFERENTIAL/PLATELET
Basophils Absolute: 0 10*3/uL (ref 0.0–0.2)
Basos: 0 %
EOS (ABSOLUTE): 0.2 10*3/uL (ref 0.0–0.4)
Eos: 2 %
Hematocrit: 38.4 % (ref 34.0–46.6)
Hemoglobin: 12.4 g/dL (ref 11.1–15.9)
Immature Grans (Abs): 0 10*3/uL (ref 0.0–0.1)
Immature Granulocytes: 0 %
Lymphocytes Absolute: 2.5 10*3/uL (ref 0.7–3.1)
Lymphs: 33 %
MCH: 26.4 pg — ABNORMAL LOW (ref 26.6–33.0)
MCHC: 32.3 g/dL (ref 31.5–35.7)
MCV: 82 fL (ref 79–97)
Monocytes Absolute: 0.4 10*3/uL (ref 0.1–0.9)
Monocytes: 5 %
Neutrophils Absolute: 4.5 10*3/uL (ref 1.4–7.0)
Neutrophils: 60 %
Platelets: 246 10*3/uL (ref 150–450)
RBC: 4.7 x10E6/uL (ref 3.77–5.28)
RDW: 14.2 % (ref 11.7–15.4)
WBC: 7.6 10*3/uL (ref 3.4–10.8)

## 2020-11-30 ENCOUNTER — Other Ambulatory Visit: Payer: Self-pay

## 2020-11-30 ENCOUNTER — Ambulatory Visit (INDEPENDENT_AMBULATORY_CARE_PROVIDER_SITE_OTHER): Payer: Medicaid Other | Admitting: Medical

## 2020-11-30 ENCOUNTER — Encounter: Payer: Self-pay | Admitting: Medical

## 2020-11-30 VITALS — BP 131/83 | HR 91 | Wt 281.7 lb

## 2020-11-30 DIAGNOSIS — N760 Acute vaginitis: Secondary | ICD-10-CM

## 2020-11-30 DIAGNOSIS — Z9889 Other specified postprocedural states: Secondary | ICD-10-CM | POA: Diagnosis not present

## 2020-11-30 DIAGNOSIS — B9689 Other specified bacterial agents as the cause of diseases classified elsewhere: Secondary | ICD-10-CM | POA: Diagnosis not present

## 2020-11-30 MED ORDER — METRONIDAZOLE 500 MG PO TABS: 500.0000 mg | ORAL_TABLET | Freq: Two times a day (BID) | ORAL | 0 refills | Status: DC

## 2020-11-30 NOTE — Patient Instructions (Signed)
Bacterial Vaginosis  Bacterial vaginosis is an infection of the vagina. It happens when too many normal germs (healthy bacteria) grow in the vagina. This infection can make it easier to get other infections from sex (STIs). It is very important for pregnant women to get treated. This infection can cause babies to be born early or at a low birth weight. What are the causes? This infection is caused by an increase in certain germs that grow in the vagina. You cannot get this infection from toilet seats, bedsheets, swimming pools, or things that touch your vagina. What increases the risk?  Having sex with a new person or more than one person.  Having sex without protection.  Douching.  Having an intrauterine device (IUD).  Smoking.  Using drugs or drinking alcohol. These can lead you to do things that are risky.  Taking certain antibiotic medicines.  Being pregnant. What are the signs or symptoms? Some women have no symptoms. Symptoms may include:  A discharge from your vagina. It may be gray or white. It can be watery or foamy.  A fishy smell. This can happen after sex or during your menstrual period.  Itching in and around your vagina.  A feeling of burning or pain when you pee (urinate). How is this treated? This infection is treated with antibiotic medicines. These may be given to you as:  A pill.  A cream for your vagina.  A medicine that you put into your vagina (suppository). If the infection comes back after treatment, you may need more antibiotics. Follow these instructions at home: Medicines  Take over-the-counter and prescription medicines as told by your doctor.  Take or use your antibiotic medicine as told by your doctor. Do not stop taking or using it, even if you start to feel better. General instructions  If the person you have sex with is a woman, tell her that you have this infection. She will need to follow up with her doctor. If you have a female  partner, he does not need to be treated.  Do not have sex until you finish treatment.  Drink enough fluid to keep your pee pale yellow.  Keep your vagina and butt clean. ? Wash the area with warm water each day. ? Wipe from front to back after you use the toilet.  If you are breastfeeding a baby, ask your doctor if you should keep doing so during treatment.  Keep all follow-up visits. How is this prevented? Self-care  Do not douche.  Use only warm water to wash around your vagina.  Wear underwear that is cotton or lined with cotton.  Do not wear tight pants and pantyhose, especially in the summer. Safe sex  Use protection when you have sex. This includes: ? Use condoms. ? Use dental dams. This is a thin layer that protects the mouth during oral sex.  Limit how many people you have sex with. To prevent this infection, it is best to have sex with just one person.  Get tested for STIs. The person you have sex with should also get tested. Drugs and alcohol  Do not smoke or use any products that contain nicotine or tobacco. If you need help quitting, ask your doctor.  Do not use drugs.  Do not drink alcohol if: ? Your doctor tells you not to drink. ? You are pregnant, may be pregnant, or are planning to become pregnant.  If you drink alcohol: ? Limit how much you have to 0-1 drink   a day. ? Know how much alcohol is in your drink. In the [place] one drink equals one 12 oz bottle of beer (355 mL), one 5 oz glass of wine (148 mL), or one 1 oz glass of hard liquor (44 mL). Where to find more information  Centers for Disease Control and Prevention: www.cdc.gov  American Sexual Health Association: www.ashastd.org  Office on Women's Health: www.womenshealth.gov Contact a doctor if:  Your symptoms do not get better, even after you are treated.  You have more discharge or pain when you pee.  You have a fever or chills.  You have pain in your belly (abdomen) or in the area  between your hips.  You have pain with sex.  You bleed from your vagina between menstrual periods. Summary  This infection can happen when too many germs (bacteria) grow in the vagina.  This infection can make it easier to get infections from sex (STIs). Treating this can lower that chance.  Get treated if you are pregnant. This infection can cause babies to be born early.  Do not stop taking or using your antibiotic medicine, even if you start to feel better. This information is not intended to replace advice given to you by your health care provider. Make sure you discuss any questions you have with your health care provider. Document Revised: 03/26/2020 Document Reviewed: 03/26/2020 Elsevier Patient Education  2021 Elsevier Inc.  

## 2020-11-30 NOTE — Progress Notes (Signed)
  History:  [name] is a 30 y.o. [id number] who presents to clinic today for evaluation after an elective 1st trimester abortion in November 2021 through [facility]. She reported missing her follow-up appointment and wanted to ensure there were no complications. She experienced 5 days of bleeding after the procedure but denied fevers, nausea, vomiting or dizziness. She endorsed continuation of regular menses. She has had intercourse since procedure with no dyspareunia or postcoital bleeding. She was seen by her PCP on 11/26/20 and diagnosed with bacterial vaginitis.   The following portions of the patient's history were reviewed and updated as appropriate: allergies, current medications, family history, past medical history, social history, past surgical history and problem list.  Review of Systems:  Review of Systems  Constitutional: Negative for chills, fever and weight loss.  Respiratory: Negative for cough, shortness of breath and wheezing.   Cardiovascular: Negative for chest pain, palpitations and leg swelling.  Gastrointestinal: Negative for abdominal pain, constipation, nausea and vomiting.  Genitourinary: Negative for dysuria, frequency, hematuria and urgency.  Neurological: Negative for dizziness, weakness and headaches.     Objective:  Physical Exam BP 131/83   Pulse 91   Wt 281 lb 11.2 oz (127.8 kg)   LMP 11/26/2020 (Exact Date)   BMI 45.47 kg/m  Physical Exam Constitutional:      Appearance: Normal appearance.  Cardiovascular:     Rate and Rhythm: Normal rate.     Heart sounds: Normal heart sounds.  Pulmonary:     Effort: Pulmonary effort is normal. No respiratory distress.     Breath sounds: Normal breath sounds.  Abdominal:     General: Bowel sounds are normal.     Palpations: Abdomen is soft.     Tenderness: There is no abdominal tenderness.  Neurological:     Mental Status: She is alert and oriented to person, place, and time.  Psychiatric:         Mood and Affect: Mood normal.        Behavior: Behavior normal.    Labs and Imaging No results found for this or any previous visit (from the past 24 hour(s)).  No results found.   Assessment & Plan:  1. BV (bacterial vaginosis) - Cervicovaginal ancillary on 11/27/19 (+) bacterial vaginitis. - Patient given metroNIDAZOLE (FLAGYL) 500 MG tablet; Take 1 tablet (500 mg total) by mouth 2 (two) times daily.  Dispense: 14 tablet; Refill: 0  2. Status post therapeutic abortion - Patient reports normal menstruation since procedure  - intermittent lower abdominal cramping relieved with NSAIDs. Patient denies vaginal bleeding and dyspareunia.   Sharolyn Weber, Baldo Daub, Student-PA 11/30/2020 12:01 PM

## 2020-11-30 NOTE — Progress Notes (Signed)
  History:  [name] is a 30 y.o. [id number] who presents to clinic today for follow-up post TAB in November 2021 and concern for BV. Patient states she had a medication induced TAB at ~ [redacted] weeks gestation. She bled for about a week following and has since resumed normal periods. LMP 11/26/20. She has been sexually active twice since abortion and uses condoms. She is not interested in birth control at this time. She has had intermittent lower abdominal cramping relieved by NSAIDs. She was at PCP recently and had vaginal culture done showing BV. This is a frequent infection for her. Last pap smear 09/13/2019 was normal.    The following portions of the patient's history were reviewed and updated as appropriate: allergies, current medications, family history, past medical history, social history, past surgical history and problem list.  Review of Systems:  Review of Systems  Constitutional: Negative for fever.  Gastrointestinal: Positive for abdominal pain.  Genitourinary: Negative for dysuria, frequency and urgency.       + vaginal bleeding, discharge      Objective:  Physical Exam BP 131/83   Pulse 91   Wt 281 lb 11.2 oz (127.8 kg)   LMP 11/26/2020 (Exact Date)   BMI 45.47 kg/m  Physical Exam Constitutional:      General: She is not in acute distress.    Appearance: Normal appearance. She is obese.  HENT:     Head: Normocephalic.  Cardiovascular:     Rate and Rhythm: Normal rate.  Pulmonary:     Effort: Pulmonary effort is normal.  Abdominal:     General: There is no distension.  Skin:    General: Skin is warm.     Findings: No erythema.  Neurological:     Mental Status: She is alert and oriented to person, place, and time.  Psychiatric:        Mood and Affect: Mood normal.        Behavior: Behavior normal.     Assessment & Plan:  1. BV (bacterial vaginosis) - Results reviewed in Epic consistent with BV, not yet treated by PCP, Rx sent to pharmacy of choice -  metroNIDAZOLE (FLAGYL) 500 MG tablet; Take 1 tablet (500 mg total) by mouth 2 (two) times daily.  Dispense: 14 tablet; Refill: 0 - Discussed hygiene products and probiotics for avoiding recurrence  - Patient advised to call if symptoms return at a a later date and can consider therapy with Flagyl and Metrogel for recurrent treatment  2. Status post therapeutic abortion - Has resumed normal periods, no concerns at this time  Approximately 15 minutes of total time was spent with this patient on history taking, patient education and documentation.  Alexandria Redden, PA-C 11/30/2020 11:47 AM

## 2020-12-02 ENCOUNTER — Other Ambulatory Visit: Payer: Self-pay | Admitting: Physician Assistant

## 2021-01-01 ENCOUNTER — Telehealth: Payer: Medicaid Other | Admitting: Emergency Medicine

## 2021-01-01 DIAGNOSIS — N760 Acute vaginitis: Secondary | ICD-10-CM

## 2021-01-01 MED ORDER — METRONIDAZOLE 500 MG PO TABS: 500.0000 mg | ORAL_TABLET | Freq: Two times a day (BID) | ORAL | 0 refills | Status: DC

## 2021-01-01 NOTE — Progress Notes (Signed)
Alexandria Fisher,  Thank you for the additional information. We are sorry that you are not feeling well. Here is how we plan to help! Based on what you shared with me it looks like you: May have a vaginosis due to bacteria  Vaginosis is an inflammation of the vagina that can result in discharge, itching and pain. The cause is usually a change in the normal balance of vaginal bacteria or an infection. Vaginosis can also result from reduced estrogen levels after menopause.  The most common causes of vaginosis are:   Bacterial vaginosis which results from an overgrowth of one on several organisms that are normally present in your vagina.   Yeast infections which are caused by a naturally occurring fungus called candida.   Vaginal atrophy (atrophic vaginosis) which results from the thinning of the vagina from reduced estrogen levels after menopause.   Trichomoniasis which is caused by a parasite and is commonly transmitted by sexual intercourse.  Factors that increase your risk of developing vaginosis include: Marland Kitchen Medications, such as antibiotics and steroids . Uncontrolled diabetes . Use of hygiene products such as bubble bath, vaginal spray or vaginal deodorant . Douching . Wearing damp or tight-fitting clothing . Using an intrauterine device (IUD) for birth control . Hormonal changes, such as those associated with pregnancy, birth control pills or menopause . Sexual activity . Having a sexually transmitted infection  Your treatment plan is Metronidazole or Flagyl 500mg  twice a day for 7 days.  I have electronically sent this prescription into the pharmacy that you have chosen.  Be sure to take all of the medication as directed. Stop taking any medication if you develop a rash, tongue swelling or shortness of breath. Mothers who are breast feeding should consider pumping and discarding their breast milk while on these antibiotics. However, there is no consensus that infant exposure at these doses  would be harmful.  Remember that medication creams can weaken latex condoms. Marland Kitchen   HOME CARE:  Good hygiene may prevent some types of vaginosis from recurring and may relieve some symptoms:  . Avoid baths, hot tubs and whirlpool spas. Rinse soap from your outer genital area after a shower, and dry the area well to prevent irritation. Don't use scented or harsh soaps, such as those with deodorant or antibacterial action. Marland Kitchen Avoid irritants. These include scented tampons and pads. . Wipe from front to back after using the toilet. Doing so avoids spreading fecal bacteria to your vagina.  Other things that may help prevent vaginosis include:  Marland Kitchen Don't douche. Your vagina doesn't require cleansing other than normal bathing. Repetitive douching disrupts the normal organisms that reside in the vagina and can actually increase your risk of vaginal infection. Douching won't clear up a vaginal infection. . Use a latex condom. Both female and female latex condoms may help you avoid infections spread by sexual contact. . Wear cotton underwear. Also wear pantyhose with a cotton crotch. If you feel comfortable without it, skip wearing underwear to bed. Yeast thrives in [employer] Your symptoms should improve in the next day or two.  GET HELP RIGHT AWAY IF:  . You have pain in your lower abdomen ( pelvic area or over your ovaries) . You develop nausea or vomiting . You develop a fever . Your discharge changes or worsens . You have persistent pain with intercourse . You develop shortness of breath, a rapid pulse, or you faint.  These symptoms could be signs of problems or infections that need to  be evaluated by a medical provider now.  MAKE SURE YOU    Understand these instructions.  Will watch your condition.  Will get help right away if you are not doing well or get worse.  Your e-visit answers were reviewed by a board certified advanced clinical practitioner to complete your personal  care plan. Depending upon the condition, your plan could have included both over the counter or prescription medications. Please review your pharmacy choice to make sure that you have choses a pharmacy that is open for you to pick up any needed prescription, Your safety is important to [place]. If you have drug allergies check your prescription carefully.   You can use MyChart to ask questions about today's visit, request a non-urgent call back, or ask for a work or school excuse for 24 hours related to this e-Visit. If it has been greater than 24 hours you will need to follow up with your provider, or enter a new e-Visit to address those concerns. You will get a MyChart message within the next two days asking about your experience. I hope that your e-visit has been valuable and will speed your recovery.  Greater than 5 minutes, yet less than 10 minutes of time have been spent researching, coordinating, and implementing care for this patient today.

## 2021-02-03 ENCOUNTER — Telehealth: Payer: Medicaid Other | Admitting: Family

## 2021-02-03 DIAGNOSIS — B373 Candidiasis of vulva and vagina: Secondary | ICD-10-CM | POA: Diagnosis not present

## 2021-02-03 DIAGNOSIS — B3731 Acute candidiasis of vulva and vagina: Secondary | ICD-10-CM

## 2021-02-03 MED ORDER — FLUCONAZOLE 150 MG PO TABS: 150.0000 mg | ORAL_TABLET | ORAL | 0 refills | Status: DC | PRN

## 2021-02-03 NOTE — Progress Notes (Signed)

## 2021-02-08 ENCOUNTER — Other Ambulatory Visit: Payer: Self-pay | Admitting: Family

## 2021-05-05 ENCOUNTER — Ambulatory Visit (INDEPENDENT_AMBULATORY_CARE_PROVIDER_SITE_OTHER): Payer: Medicaid Other | Admitting: General Practice

## 2021-05-05 DIAGNOSIS — Z3201 Encounter for pregnancy test, result positive: Secondary | ICD-10-CM | POA: Diagnosis not present

## 2021-05-05 LAB — POCT PREGNANCY, URINE: Preg Test, Ur: POSITIVE — AB

## 2021-05-05 MED ORDER — PRENATAL GUMMIES 0.18-25 MG PO CHEW: 1.0000 | CHEWABLE_TABLET | Freq: Every day | ORAL | 11 refills | Status: DC

## 2021-05-05 NOTE — Progress Notes (Addendum)
Patient came by office today and dropped off urine sample for UPT. UPT +.  Attempted to call patient at home, no answer- left voicemail message to call us back.  Patient called back into front office. Informed patient of + UPT. Patient reports first positive home test around a week ago. LMP 03/26/21 EDD 12/31/21 [redacted]w[redacted]d Patient is not currently taking any vitamins/medicines-- recommended she start PNV. Rx for gummy sent to pharmacy per patient request. Patient reports vaginal itching, odor, and yellowish discharge for a week and states she gets BV frequently. Offered nurse visit appt for self swab 7/29 @ 9am & patient confirms appt. [facility] [place].  CKoren BoundRN BSN 05/05/21

## 2021-05-05 NOTE — Addendum Note (Signed)
Addended by: Shelly Coss on: 05/05/2021 02:12 PM   Modules accepted: Orders

## 2021-05-05 NOTE — Patient Instructions (Signed)
[email or web address]

## 2021-05-05 NOTE — Progress Notes (Signed)
Patient was assessed and managed by nursing staff during this encounter. I have reviewed the chart and agree with the documentation and plan.   Gaylan Gerold, MSN, CNM, [facility] 05/05/21 4:49 PM

## 2021-05-07 ENCOUNTER — Ambulatory Visit (INDEPENDENT_AMBULATORY_CARE_PROVIDER_SITE_OTHER): Payer: Medicaid Other

## 2021-05-07 ENCOUNTER — Other Ambulatory Visit: Payer: Self-pay

## 2021-05-07 ENCOUNTER — Other Ambulatory Visit (HOSPITAL_COMMUNITY)
Admission: RE | Admit: 2021-05-07 | Discharge: 2021-05-07 | Disposition: A | Discharge status: Home / Self Care | Payer: Medicaid Other | Source: Ambulatory Visit | Attending: Family Medicine | Admitting: Family Medicine

## 2021-05-07 VITALS — BP 133/81 | HR 5 | Wt 285.0 lb

## 2021-05-07 DIAGNOSIS — O26891 Other specified pregnancy related conditions, first trimester: Secondary | ICD-10-CM | POA: Insufficient documentation

## 2021-05-07 DIAGNOSIS — N898 Other specified noninflammatory disorders of vagina: Secondary | ICD-10-CM | POA: Insufficient documentation

## 2021-05-07 MED ORDER — PREPLUS 27-1 MG PO TABS: 1.0000 | ORAL_TABLET | Freq: Every day | ORAL | 10 refills | Status: DC

## 2021-05-07 MED ORDER — METRONIDAZOLE 500 MG PO TABS: 500.0000 mg | ORAL_TABLET | Freq: Two times a day (BID) | ORAL | 0 refills | Status: DC

## 2021-05-07 NOTE — Progress Notes (Signed)
Pt here today with c/o vaginal discharge that has a fishy odor.  Pt reports some itching as well.  Pt e-prescribed per standing protocol Flagyl 500 mg po bid x 7 days based on reported sx's.   Pt explained how to obtain self swab and that we will call with abnormal results.  Pt verbalized understanding with no further questions.   Frances Nickels  05/07/21

## 2021-05-10 LAB — CERVICOVAGINAL ANCILLARY ONLY
Bacterial Vaginitis (gardnerella): NEGATIVE
Candida Glabrata: NEGATIVE
Candida Vaginitis: NEGATIVE
Chlamydia: NEGATIVE
Comment: NEGATIVE
Comment: NEGATIVE
Comment: NEGATIVE
Comment: NEGATIVE
Comment: NEGATIVE
Comment: NORMAL
Neisseria Gonorrhea: NEGATIVE
Trichomonas: NEGATIVE

## 2021-05-10 NOTE — Progress Notes (Signed)
Patient was assessed and managed by nursing staff during this encounter. I have reviewed the chart and agree with the documentation and plan. I have also made any necessary editorial changes.  Aletha Halim, MD 05/10/2021 9:52 AM

## 2021-05-11 ENCOUNTER — Other Ambulatory Visit: Payer: Self-pay | Admitting: *Deleted

## 2021-05-11 MED ORDER — PRENATAL VITAMINS 28-0.8 MG PO TABS: 1.0000 | ORAL_TABLET | Freq: Every day | ORAL | 11 refills | Status: DC

## 2021-05-11 NOTE — Addendum Note (Signed)
Addended by: Langston Reusing on: 05/11/2021 02:58 PM   Modules accepted: Orders

## 2021-05-25 ENCOUNTER — Other Ambulatory Visit: Payer: Self-pay | Admitting: Physician Assistant

## 2021-05-25 DIAGNOSIS — K219 Gastro-esophageal reflux disease without esophagitis: Secondary | ICD-10-CM

## 2021-05-25 DIAGNOSIS — R1013 Epigastric pain: Secondary | ICD-10-CM

## 2021-05-25 NOTE — Telephone Encounter (Signed)
  Notes to clinic:  The original prescription was discontinued on 11/30/2020 by Annabell Howells, RN. Renewing this prescription may not be appropriate   Requested Prescriptions  Pending Prescriptions Disp Refills   omeprazole (PRILOSEC) 40 MG capsule [Pharmacy Med Name: omeprazole 40 mg capsule,delayed release] 60 capsule 3    Sig: Take 1 capsule (40 mg total) by mouth daily. To decrease stomach acid     Gastroenterology: Proton Pump Inhibitors Passed - 05/25/2021  9:50 AM      Passed - Valid encounter within last 12 months    Recent Outpatient Visits           6 months ago Vaginal discharge   [place], Dionne Bucy, [place]   1 year ago No-show for appointment   [place], [place], NP   1 year ago No-show for appointment   [place], [place], NP   1 year ago Annual physical exam   [place] Antony Blackbird, MD   1 year ago No-show for appointment   [place], Flonnie Hailstone, NP

## 2021-06-07 ENCOUNTER — Telehealth: Payer: Self-pay | Admitting: *Deleted

## 2021-06-07 NOTE — Telephone Encounter (Signed)
Alexandria Fisher called front desk and asked to speak with nurse. I called Alexandria Fisher and she reports last night when she wiped there was a little light pink discharge on tissue. States this am there is a little dark brown discharge when she wiped. She denies bright red bleeding . She c/o occasional mild cramping , not severe pain. She states she hasn't been examined recently and has not had intercourse recently. She had a wet prep 05/05/21 that was negative for STD's.  We discussed there is many reasons for her discharge and since it is dark brown now it is likely stopping. I advised her if she has bleeding like a period or severe pain she will need to be examined at [facility]. We reviewed she has her new ob appointment this week on 06/10/21. She voices understanding. Alexandria Casimir,RN

## 2021-06-10 ENCOUNTER — Other Ambulatory Visit: Payer: Self-pay

## 2021-06-10 ENCOUNTER — Ambulatory Visit (INDEPENDENT_AMBULATORY_CARE_PROVIDER_SITE_OTHER): Payer: Medicaid Other | Admitting: Family Medicine

## 2021-06-10 ENCOUNTER — Encounter: Payer: Self-pay | Admitting: Family Medicine

## 2021-06-10 VITALS — BP 130/80 | HR 104 | Wt 291.1 lb

## 2021-06-10 DIAGNOSIS — Z3490 Encounter for supervision of normal pregnancy, unspecified, unspecified trimester: Secondary | ICD-10-CM | POA: Insufficient documentation

## 2021-06-10 DIAGNOSIS — O99210 Obesity complicating pregnancy, unspecified trimester: Secondary | ICD-10-CM

## 2021-06-10 DIAGNOSIS — Z3480 Encounter for supervision of other normal pregnancy, unspecified trimester: Secondary | ICD-10-CM

## 2021-06-10 DIAGNOSIS — O0990 Supervision of high risk pregnancy, unspecified, unspecified trimester: Secondary | ICD-10-CM

## 2021-06-10 DIAGNOSIS — D573 Sickle-cell trait: Secondary | ICD-10-CM

## 2021-06-10 DIAGNOSIS — Z3491 Encounter for supervision of normal pregnancy, unspecified, first trimester: Secondary | ICD-10-CM

## 2021-06-10 DIAGNOSIS — O10919 Unspecified pre-existing hypertension complicating pregnancy, unspecified trimester: Secondary | ICD-10-CM

## 2021-06-10 MED ORDER — GOJJI WEIGHT SCALE MISC: 1.0000 | 0 refills | Status: DC | PRN

## 2021-06-10 MED ORDER — BLOOD PRESSURE KIT DEVI: 1.0000 | 0 refills | Status: DC | PRN

## 2021-06-10 NOTE — Patient Instructions (Addendum)
Monitor your BP at home. It should stay below 140/90, If it is above that, please let us know.   Start aspirin '81mg'$  daily on 06/24/2021.

## 2021-06-10 NOTE — Progress Notes (Signed)
FHR obtained via informal bedside ultrasound, FHR 163bpm

## 2021-06-11 ENCOUNTER — Encounter: Payer: Self-pay | Admitting: Family Medicine

## 2021-06-11 DIAGNOSIS — O10919 Unspecified pre-existing hypertension complicating pregnancy, unspecified trimester: Secondary | ICD-10-CM | POA: Insufficient documentation

## 2021-06-11 LAB — CBC/D/PLT+RPR+RH+ABO+RUBIGG...
Antibody Screen: NEGATIVE
Basophils Absolute: 0 10*3/uL (ref 0.0–0.2)
Basos: 0 %
EOS (ABSOLUTE): 0.1 10*3/uL (ref 0.0–0.4)
Eos: 1 %
HCV Ab: <0.1 s/co ratio (ref 0.0–0.9)
HIV Screen 4th Generation wRfx: NONREACTIVE
Hematocrit: 35.7 % (ref 34.0–46.6)
Hemoglobin: 11.8 g/dL (ref 11.1–15.9)
Hepatitis B Surface Ag: NEGATIVE
Immature Grans (Abs): 0 10*3/uL (ref 0.0–0.1)
Immature Granulocytes: 0 %
Lymphocytes Absolute: 1.8 10*3/uL (ref 0.7–3.1)
Lymphs: 14 %
MCH: 26.8 pg (ref 26.6–33.0)
MCHC: 33.1 g/dL (ref 31.5–35.7)
MCV: 81 fL (ref 79–97)
Monocytes Absolute: 0.7 10*3/uL (ref 0.1–0.9)
Monocytes: 5 %
Neutrophils Absolute: 10.3 10*3/uL — ABNORMAL HIGH (ref 1.4–7.0)
Neutrophils: 80 %
Platelets: 224 10*3/uL (ref 150–450)
RBC: 4.41 x10E6/uL (ref 3.77–5.28)
RDW: 14.8 % (ref 11.7–15.4)
RPR Ser Ql: NONREACTIVE
Rh Factor: POSITIVE
Rubella Antibodies, IGG: 3.45 index (ref >0.99)
WBC: 13.1 10*3/uL — ABNORMAL HIGH (ref 3.4–10.8)

## 2021-06-11 LAB — HEMOGLOBIN A1C
Est. average glucose Bld gHb Est-mCnc: 111 mg/dL
Hgb A1c MFr Bld: 5.5 % (ref 4.8–5.6)

## 2021-06-11 LAB — HCV INTERPRETATION

## 2021-06-11 NOTE — Progress Notes (Signed)
History:   Alexandria Fisher is a 30 y.o. [phone] at 40w0dby LMP being seen today for her first obstetrical visit. She is unsure about LMP, but feels it is relatively close. Her obstetrical history is significant for obesity and history of chronic hypertension . 3 uncomplicated vaginal deliveries. Patient does intend to breast feed. Pregnancy history fully reviewed.  Patient reports no complaints. Known history of chronic hypertension, not currently on any anti-hypertensives. Previously on medication through her PCP, however has been off for the past year and reports BP has been "good."      HISTORY: OB History  Gravida Para Term Preterm AB Living  _0 0 3 3  SAB IAB Ectopic Multiple Live Births  2 1 0 0 3    # Outcome Date GA Lbr Len/2nd Weight Sex Delivery Anes PTL Lv  7 Current           6 SAB 2021 945w0d       5 Term 07/06/16 3960w0d:00 / 00:01 7 lb 11.1 oz (3.49 kg) F Vag-Spont EPI  LIV     Name: BEAJODECI, RINI  Apgar1: 9  Apgar5: 9  4 IAB 2015          3 SAB 2013          2 Term 07/14/11 37w61w3d00 / 00:07 6 lb 14 oz (3.118 kg) F Vag-Spont None  LIV     Name: Bangerter,GIRL Jarvis     Apgar1: 9  Apgar5: 9  1 Term 10/29/09   7 lb 7 oz (3.374 kg) F Vag-Spont   LIV    Last pap smear was done 09/2019 and was normal  Past Medical History:  Diagnosis Date   Chlamydia    Chronic hypertension    Chronic tonsillitis 09/12/2018   Depression    Hemoglobin A-S genotype (HCC)Howe27/2017   Hx MRSA infection 07/14/2011   Hypertension 10/24/2013   Infection    Past Surgical History:  Procedure Laterality Date   DILATION AND EVACUATION N/A 09/19/2013   Procedure: DILATATION AND EVACUATION;  Surgeon: KellGuss Bunde;  [facility];  Service: Gynecology;  Laterality: N/A;   TONSILLECTOMY N/A 09/12/2018   Procedure: TONSILLECTOMY;  Surgeon: MarcHelayne Seminole;  [facility];  Laterality: N/A;   Family History  Problem  Relation Age of Onset   Anemia Mother    Hypertension Mother    Stroke Mother    Hypertension Father    Breast cancer Maternal Grandmother    Diabetes Maternal Grandmother    Hypertension Maternal Grandmother    Social History   Tobacco Use   Smoking status: Former    Packs/day: 0.10    Types: Cigarettes    Quit date: 11/05/2015    Years since quitting: 5.6   Smokeless tobacco: Former  VapiScientific laboratory technician: Never used  Substance Use Topics   Alcohol use: Not Currently    Comment: occasionally    Drug use: No   No Known Allergies Current Outpatient Medications on File Prior to Visit  Medication Sig Dispense Refill   Prenatal Vit-Fe Fumarate-FA (PRENATAL VITAMINS) 28-0.8 MG TABS Take 1 tablet by mouth daily. 30 tablet 11   No current facility-administered medications on file prior to visit.    Review of Systems Pertinent items noted in HPI and remainder of comprehensive ROS otherwise negative. Physical Exam:   Vitals:   06/10/21  1425  BP: 130/80  Pulse: (!) 104  Weight: 291 lb 1.6 oz (132 kg)   Fetal Heart Rate (bpm): 163  Constitutional: Well-developed, well-nourished pregnant female in no acute distress.  HEENT: PERRLA Skin: normal color and turgor, no rash Cardiovascular: normal rate & rhythm, no murmur Respiratory: normal effort GI: Abd soft MS:no edema, normal ROM Neurologic: Alert and oriented x 4.  GU: no CVA tenderness Pelvic: deferred   Assessment:    Pregnancy: [id number] Patient Active Problem List   Diagnosis Date Noted   Chronic hypertension affecting pregnancy 06/11/2021   Supervision of high risk pregnancy, antepartum 06/10/2021   Hemoglobin A-S genotype ([place]) 01/04/2016     Plan:    1. Supervision of high risk pregnancy, antepartum Doing well.  - CBC/D/Plt+RPR+Rh+ABO+RubIgG... - Genetic Screening - Hemoglobin A1c - CHL AMB BABYSCRIPTS SCHEDULE OPTIMIZATION - Culture, OB Urine - HIV Antibody (routine testing w rflx) - Misc.  Devices (GOJJI WEIGHT SCALE) MISC; 1 Device by Does not apply route as needed.  Dispense: 1 each; Refill: 0 - [place] MFM OB DETAIL +14 WK; Future - Blood Pressure Monitoring (BLOOD PRESSURE KIT) DEVI; 1 Device by Does not apply route as needed.  Dispense: 1 each; Refill: 0  2. Hemoglobin A-S genotype ([place])  3. Obesity in pregnancy BMI 46. Will discuss appropriate TWG throughout pregnancy. A1c drawn.   4. Chronic hypertension affecting pregnancy No current meds. Start ASA 31m at [redacted] weeks gestation.    Initial labs drawn. Continue prenatal vitamins. Problem list reviewed and updated. Genetic Screening discussed, NIPS: requested. Ultrasound discussed; fetal anatomic survey: ordered. Anticipatory guidance about prenatal visits given including labs, ultrasounds, and testing. Discussed usage of Babyscripts and virtual visits as additional source of managing and completing prenatal visits in midst of coronavirus and pandemic.   Encouraged to complete MyChart Registration for her ability to review results, send requests, and have questions addressed.  [facility] with multiple MDs and Advanced Practice Providers was explained to patient; also emphasized that residents, students are part of our team. (Patient has followed through our clinic before as well).  Routine obstetric precautions reviewed. Encouraged to seek out care at office or emergency room ([facility]) for urgent and/or emergent concerns.  Return in about 4 weeks (around 07/08/2021) for HROB.     SDarrelyn Hillock DO Ob Fellow

## 2021-06-12 ENCOUNTER — Encounter: Payer: Self-pay | Admitting: Family Medicine

## 2021-06-12 LAB — CULTURE, OB URINE

## 2021-06-12 LAB — URINE CULTURE, OB REFLEX

## 2021-06-16 ENCOUNTER — Other Ambulatory Visit: Payer: Self-pay

## 2021-06-16 DIAGNOSIS — O219 Vomiting of pregnancy, unspecified: Secondary | ICD-10-CM

## 2021-06-16 MED ORDER — PROMETHAZINE HCL 25 MG PO TABS
25.0000 mg | ORAL_TABLET | Freq: Four times a day (QID) | ORAL | 0 refills | Status: DC | PRN
Start: 2021-06-16 — End: 2021-09-17

## 2021-06-16 NOTE — Progress Notes (Signed)
Patient requests prescription medication for nausea. Phenergan sent per protocol.   Apolonio Schneiders RN 06/16/21

## 2021-06-28 ENCOUNTER — Encounter: Payer: Self-pay | Admitting: General Practice

## 2021-06-29 ENCOUNTER — Encounter: Payer: Self-pay | Admitting: Family Medicine

## 2021-07-12 ENCOUNTER — Ambulatory Visit (INDEPENDENT_AMBULATORY_CARE_PROVIDER_SITE_OTHER): Payer: Medicaid Other | Admitting: Medical

## 2021-07-12 ENCOUNTER — Encounter: Payer: Self-pay | Admitting: Medical

## 2021-07-12 ENCOUNTER — Other Ambulatory Visit: Payer: Self-pay

## 2021-07-12 VITALS — BP 115/62 | HR 99 | Wt 283.9 lb

## 2021-07-12 DIAGNOSIS — O10919 Unspecified pre-existing hypertension complicating pregnancy, unspecified trimester: Secondary | ICD-10-CM

## 2021-07-12 DIAGNOSIS — O99210 Obesity complicating pregnancy, unspecified trimester: Secondary | ICD-10-CM

## 2021-07-12 DIAGNOSIS — Z3A15 15 weeks gestation of pregnancy: Secondary | ICD-10-CM

## 2021-07-12 DIAGNOSIS — O0990 Supervision of high risk pregnancy, unspecified, unspecified trimester: Secondary | ICD-10-CM

## 2021-07-12 DIAGNOSIS — O219 Vomiting of pregnancy, unspecified: Secondary | ICD-10-CM

## 2021-07-12 MED ORDER — ASPIRIN EC 81 MG PO TBEC: 81.0000 mg | DELAYED_RELEASE_TABLET | Freq: Every day | ORAL | 11 refills | Status: DC

## 2021-07-12 MED ORDER — ONDANSETRON HCL 4 MG PO TABS: 4.0000 mg | ORAL_TABLET | Freq: Four times a day (QID) | ORAL | 0 refills | Status: DC

## 2021-07-12 NOTE — Progress Notes (Signed)
   PRENATAL VISIT NOTE  Subjective:  Alexandria Fisher is a 30 y.o. [phone] at [redacted]w[redacted]d being seen today for ongoing prenatal care.  She is currently monitored for the following issues for this high-risk pregnancy and has Hemoglobin A-S genotype ([place]); Supervision of high risk pregnancy, antepartum; and Chronic hypertension affecting pregnancy on their problem list.  Patient reports nausea.  Contractions: Not present. Vag. Bleeding: None.  Movement: Present. Denies leaking of fluid.   The following portions of the patient's history were reviewed and updated as appropriate: allergies, current medications, past family history, past medical history, past social history, past surgical history and problem list.   Objective:   Vitals:   07/12/21 1623  BP: 115/62  Pulse: 99  Weight: 283 lb 14.4 oz (128.8 kg)    Fetal Status: Fetal Heart Rate (bpm): 155   Movement: Present     General:  Alert, oriented and cooperative. Patient is in no acute distress.  Skin: Skin is warm and dry. No rash noted.   Cardiovascular: Normal heart rate noted  Respiratory: Normal respiratory effort, no problems with respiration noted  Abdomen: Soft, gravid, appropriate for gestational age.  Pain/Pressure: Absent     Pelvic: Cervical exam deferred        Extremities: Normal range of motion.  Edema: None  Mental Status: Normal mood and affect. Normal behavior. Normal judgment and thought content.   Assessment and Plan:  Pregnancy: [id number] at [redacted]w[redacted]d 1. Supervision of high risk pregnancy, antepartum - AFP, Serum, Open Spina Bifida - Still unsure of MOF, MOC - Planning circumcision, inpatient - [facility] peds  2. Chronic hypertension affecting pregnancy - aspirin EC 81 MG tablet; Take 1 tablet (81 mg total) by mouth daily. Swallow whole.  Dispense: 30 tablet; Refill: 11  3. Obesity in pregnancy - BASA  4. [redacted] weeks gestation of pregnancy  5. Nausea and vomiting during pregnancy prior to [redacted] weeks gestation -  ondansetron (ZOFRAN) 4 MG tablet; Take 1 tablet (4 mg total) by mouth every 6 (six) hours.  Dispense: 20 tablet; Refill: 0  Preterm labor symptoms and general obstetric precautions including but not limited to vaginal bleeding, contractions, leaking of fluid and fetal movement were reviewed in detail with the patient. Please refer to After Visit Summary for other counseling recommendations.   Return in about 4 weeks (around 08/09/2021) for In-Person, HOB APP.  Future Appointments  Date Time Provider [place]  08/06/2021 10:30 AM [facility] NURSE [facility]  08/06/2021 10:45 AM [facility] US5 [facility]  08/09/2021  2:55 PM Griffin Basil, MD [facility]    Kerry Hough, PA-C

## 2021-07-14 LAB — AFP, SERUM, OPEN SPINA BIFIDA
AFP MoM: 1.85
AFP Value: 44.9 ng/mL
Gest. Age on Collection Date: 15.3 weeks
Maternal Age At EDD: 30.9 yr
OSBR Risk 1 IN: 2270
Test Results:: NEGATIVE
Weight: 284 [lb_av]

## 2021-08-06 ENCOUNTER — Ambulatory Visit: Payer: Medicaid Other | Admitting: *Deleted

## 2021-08-06 ENCOUNTER — Encounter: Payer: Self-pay | Admitting: *Deleted

## 2021-08-06 ENCOUNTER — Other Ambulatory Visit: Payer: Self-pay | Admitting: *Deleted

## 2021-08-06 ENCOUNTER — Ambulatory Visit (HOSPITAL_BASED_OUTPATIENT_CLINIC_OR_DEPARTMENT_OTHER): Payer: Medicaid Other | Admitting: Maternal & Fetal Medicine

## 2021-08-06 ENCOUNTER — Other Ambulatory Visit: Payer: Self-pay

## 2021-08-06 ENCOUNTER — Ambulatory Visit: Payer: Medicaid Other | Attending: Family Medicine

## 2021-08-06 VITALS — BP 138/72 | HR 102

## 2021-08-06 DIAGNOSIS — O4440 Low lying placenta NOS or without hemorrhage, unspecified trimester: Secondary | ICD-10-CM | POA: Insufficient documentation

## 2021-08-06 DIAGNOSIS — O10919 Unspecified pre-existing hypertension complicating pregnancy, unspecified trimester: Secondary | ICD-10-CM

## 2021-08-06 DIAGNOSIS — O0990 Supervision of high risk pregnancy, unspecified, unspecified trimester: Secondary | ICD-10-CM

## 2021-08-06 DIAGNOSIS — Z3492 Encounter for supervision of normal pregnancy, unspecified, second trimester: Secondary | ICD-10-CM

## 2021-08-06 NOTE — Progress Notes (Signed)
Ms. Yerby is a 30 yo G7P3 who is here at 32 w 0 d by LMP. She has a reported EDD of 12/31/21.  She is seen at the request of Darrelyn Hillock, DO  She reports that her LMP is not exact but close within 7-10 days.  Her history is complicated by [facility] on low dose ASA and a BMI of 45 . 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  She 3 term vaginal deliveries without complications.  Today we observed a single intrauterine pregnancy with measurements consistent with LMP. However today's measurments are 6 days ahead of dates. There are no makers of aneuploidy observed. There is good fetal movement and amniotic fluid volume.  I explained that given Ms. Beafort's uncertainty regarding her LMP we recommend adjusting her EDD to today's ultrasound of 12/25/21.  Chronic Hypertension We discussed the increased risk for preeclampsia, placental abruption and fetal growth restriction in women with chronic hypertension. She is taking low dose ASA for preeclampsia prevention. I explained that serial growth exams should be performed every 4 weeks throughout the pregnancy. Medical therapy should be initiated if blood pressure persist >150/100's. If medical therapy is initiated we recommend weekly testing at 32 weeks. She is aware of the s/sx or preeclampsia including headache, vision changes and right upper quadrant pain.  Baseline labs of UPC, CBC, and CMP should be performed if not performed previously.  BMI >40 I discussed that an elevated BMI increases the risk for fetal macrosomia, gestational diabetes, preeclampsia, cesarean delivery, unrecognized fetal malformation, stillbirth and thrombosis is the postpartum period.  We recommend initiating daily ASA for preeclampsia prevention.  Secondly we recommend a total weight gain of 11-20 lbs throughout the pregnancy Serial growth in 4-6 weeks  with weekly testing beginning between 34-36 weeks. Delivery between 39-40  weeks if no other pregnancy complication observed.  Fibroids:  I discussed with Ms Beavfort that she has four small fibroids range from 2.97 to 4.22 cm in diameter. Fibroids are associated with bleeding, miscarriage and fibroid degeneration in pregnancy. Given this we will observe overall growth and monitor Ms. Beauforts symptoms throughout the pregnancy  Low lying Placenta I discussed with Ms. Felicetti the finding of a low lying placenta of 1.7 cm from the internal os. I discussed that overall a low lying placenta is associated with an increased risk for bleeding at the time of delivery and antepartum.  However, given her gestational age and the placental edge >1.5 I reassured her that the placental edge will be clear at the time of delivery. Bleeding precautions provided.  Recommendations: Follow up growth in 4-6 weeks Initiate weekly testing between 34-36 weeks  All questions answered  I spent 30 minutes with > 50% in face to face consultation  Vikki Ports, MD.

## 2021-08-09 ENCOUNTER — Other Ambulatory Visit: Payer: Self-pay | Admitting: Family Medicine

## 2021-08-09 ENCOUNTER — Ambulatory Visit (INDEPENDENT_AMBULATORY_CARE_PROVIDER_SITE_OTHER): Payer: Medicaid Other | Admitting: Obstetrics and Gynecology

## 2021-08-09 ENCOUNTER — Other Ambulatory Visit: Payer: Self-pay

## 2021-08-09 VITALS — BP 124/74 | HR 100 | Wt 277.0 lb

## 2021-08-09 DIAGNOSIS — O99212 Obesity complicating pregnancy, second trimester: Secondary | ICD-10-CM

## 2021-08-09 DIAGNOSIS — Z6841 Body Mass Index (BMI) 40.0 and over, adult: Secondary | ICD-10-CM

## 2021-08-09 DIAGNOSIS — O10919 Unspecified pre-existing hypertension complicating pregnancy, unspecified trimester: Secondary | ICD-10-CM

## 2021-08-09 DIAGNOSIS — O0990 Supervision of high risk pregnancy, unspecified, unspecified trimester: Secondary | ICD-10-CM

## 2021-08-09 DIAGNOSIS — D573 Sickle-cell trait: Secondary | ICD-10-CM

## 2021-08-09 DIAGNOSIS — Z3A20 20 weeks gestation of pregnancy: Secondary | ICD-10-CM

## 2021-08-09 DIAGNOSIS — O4442 Low lying placenta NOS or without hemorrhage, second trimester: Secondary | ICD-10-CM

## 2021-08-09 NOTE — Progress Notes (Signed)
   PRENATAL VISIT NOTE  Subjective:  Alexandria Fisher is a 30 y.o. [phone] at [redacted]w[redacted]d being seen today for ongoing prenatal care.  She is currently monitored for the following issues for this high-risk pregnancy and has Hemoglobin A-S genotype ([place]); Supervision of high risk pregnancy, antepartum; Chronic hypertension affecting pregnancy; [redacted] weeks gestation of pregnancy; Obesity affecting pregnancy in second trimester; BMI 40.0-44.9, adult ([place]); and Low lying placenta nos or without hemorrhage, second trimester on their problem list.  Patient doing well with no acute concerns today. She reports no complaints.  Contractions: Not present. Vag. Bleeding: None.  Movement: Present. Denies leaking of fluid.   The following portions of the patient's history were reviewed and updated as appropriate: allergies, current medications, past family history, past medical history, past social history, past surgical history and problem list. Problem list updated.  Objective:   Vitals:   08/09/21 1510  BP: 124/74  Pulse: 100  Weight: 277 lb (125.6 kg)    Fetal Status: Fetal Heart Rate (bpm): 160   Movement: Present     General:  Alert, oriented and cooperative. Patient is in no acute distress.  Skin: Skin is warm and dry. No rash noted.   Cardiovascular: Normal heart rate noted  Respiratory: Normal respiratory effort, no problems with respiration noted  Abdomen: Soft, gravid, appropriate for gestational age.  Pain/Pressure: Present     Pelvic: Cervical exam deferred        Extremities: Normal range of motion.  Edema: Trace  Mental Status:  Normal mood and affect. Normal behavior. Normal judgment and thought content.   Assessment and Plan:  Pregnancy: [id number] at [redacted]w[redacted]d  1. [redacted] weeks gestation of pregnancy   2. Supervision of high risk pregnancy, antepartum Continue routine care Monitor BP closely  3. Hemoglobin A-S genotype ([place])   4. Chronic hypertension affecting pregnancy BP WNL today, pt  is on no meds  5. Obesity affecting pregnancy in second trimester   6. BMI 40.0-44.9, adult ([place])   7. Low lying placenta nos or without hemorrhage, second trimester Pt advised pelvic rest until placental age has moved away from the  cervix  Preterm labor symptoms and general obstetric precautions including but not limited to vaginal bleeding, contractions, leaking of fluid and fetal movement were reviewed in detail with the patient.  Please refer to After Visit Summary for other counseling recommendations.   Return in about 3 weeks (around 08/30/2021) for [facility], in person.   Lynnda Shields, MD Faculty Attending Center for [facility]

## 2021-09-01 ENCOUNTER — Ambulatory Visit (INDEPENDENT_AMBULATORY_CARE_PROVIDER_SITE_OTHER): Payer: Medicaid Other | Admitting: Family Medicine

## 2021-09-01 ENCOUNTER — Other Ambulatory Visit: Payer: Self-pay

## 2021-09-01 ENCOUNTER — Encounter: Payer: Self-pay | Admitting: Family Medicine

## 2021-09-01 VITALS — BP 128/71 | HR 107 | Wt 274.8 lb

## 2021-09-01 DIAGNOSIS — O99212 Obesity complicating pregnancy, second trimester: Secondary | ICD-10-CM

## 2021-09-01 DIAGNOSIS — O0990 Supervision of high risk pregnancy, unspecified, unspecified trimester: Secondary | ICD-10-CM

## 2021-09-01 DIAGNOSIS — Z3A23 23 weeks gestation of pregnancy: Secondary | ICD-10-CM

## 2021-09-01 DIAGNOSIS — O4440 Low lying placenta NOS or without hemorrhage, unspecified trimester: Secondary | ICD-10-CM

## 2021-09-01 DIAGNOSIS — O10919 Unspecified pre-existing hypertension complicating pregnancy, unspecified trimester: Secondary | ICD-10-CM

## 2021-09-01 NOTE — Progress Notes (Signed)
   PRENATAL VISIT NOTE  Subjective:  Alexandria Fisher is a 30 y.o. [phone] at [redacted]w[redacted]d being seen today for ongoing prenatal care.  She is currently monitored for the following issues for this high-risk pregnancy and has Hemoglobin A-S genotype ([place]); Supervision of high risk pregnancy, antepartum; Chronic hypertension affecting pregnancy; Obesity affecting pregnancy in second trimester; BMI 40.0-44.9, adult ([place]); and Low lying placenta nos or without hemorrhage, second trimester on their problem list.  Patient reports some increased intermittent vaginal pain/pressure. It is not constant. Occasionally has low back pain as well. Contractions: Not present. Vag. Bleeding: None.  Movement: Present. Denies leaking of fluid.   The following portions of the patient's history were reviewed and updated as appropriate: allergies, current medications, past family history, past medical history, past social history, past surgical history and problem list.   Objective:   Vitals:   09/01/21 1602  BP: 128/71  Pulse: (!) 107  Weight: 274 lb 12.8 oz (124.6 kg)    Fetal Status: Fetal Heart Rate (bpm): 144   Movement: Present  General:  Alert, oriented and cooperative. Patient is in no acute distress.  Skin: Skin is warm and dry. No rash noted.   Cardiovascular: Normal heart rate noted.  Respiratory: Normal respiratory effort, no problems with respiration noted.  Abdomen: Soft, gravid, appropriate for gestational age.     Pelvic: Cervical exam deferred.  Extremities: Normal range of motion.  Edema: None.  Mental Status: Normal mood and affect. Normal behavior. Normal judgment and thought content.   Assessment and Plan:  Pregnancy: [id number] at [redacted]w[redacted]d  1. Supervision of high risk pregnancy, antepartum 2. [redacted] weeks gestation of pregnancy Progressing well. Provided reassurance about intermittent pelvic pressure/discomfort in pregnancy. Reviewed signs/symptoms that would warrant further evaluation. FHT within  normal limits. Follow up visit with 28 week labs in 4 weeks. Advised patient to come to appointment fasting.   3. Chronic hypertension affecting pregnancy Stable. No medications currently. BP at goal. No symptoms. Taking ASA. Will continue to monitor.   4. Obesity affecting pregnancy in second trimester Normal growth. Follow up scheduled.   5. Low-lying placenta Noted to be 1.7 cm from the internal os on recent detailed [place]. Reassured by MFM that this will likely resolve. Will continue to monitor. Bleeding precautions reviewed.   Preterm labor symptoms and general obstetric precautions including but not limited to vaginal bleeding, contractions, leaking of fluid and fetal movement were reviewed in detail with the patient.  Please refer to After Visit Summary for other counseling recommendations.   Return in about 4 weeks (around 09/29/2021) for follow up HR OB visit.  Future Appointments  Date Time Provider [place]  09/17/2021 10:45 AM [facility] NURSE [facility]  09/17/2021 11:00 AM [facility] US1 [facility]  09/30/2021  8:20 AM [facility] LAB [facility]  09/30/2021  9:15 AM Aletha Halim, MD [facility]    Genia Del, MD

## 2021-09-01 NOTE — Progress Notes (Signed)
Patient reports lower back and vaginal pain

## 2021-09-17 ENCOUNTER — Other Ambulatory Visit: Payer: Self-pay

## 2021-09-17 ENCOUNTER — Other Ambulatory Visit: Payer: Self-pay | Admitting: *Deleted

## 2021-09-17 ENCOUNTER — Ambulatory Visit: Payer: Medicaid Other | Attending: Maternal & Fetal Medicine

## 2021-09-17 ENCOUNTER — Ambulatory Visit: Payer: Medicaid Other | Admitting: *Deleted

## 2021-09-17 VITALS — BP 125/67 | HR 98

## 2021-09-17 DIAGNOSIS — O99212 Obesity complicating pregnancy, second trimester: Secondary | ICD-10-CM | POA: Insufficient documentation

## 2021-09-17 DIAGNOSIS — Z3A25 25 weeks gestation of pregnancy: Secondary | ICD-10-CM

## 2021-09-17 DIAGNOSIS — D259 Leiomyoma of uterus, unspecified: Secondary | ICD-10-CM

## 2021-09-17 DIAGNOSIS — O3412 Maternal care for benign tumor of corpus uteri, second trimester: Secondary | ICD-10-CM

## 2021-09-17 DIAGNOSIS — E119 Type 2 diabetes mellitus without complications: Secondary | ICD-10-CM

## 2021-09-17 DIAGNOSIS — O10912 Unspecified pre-existing hypertension complicating pregnancy, second trimester: Secondary | ICD-10-CM

## 2021-09-17 DIAGNOSIS — Z3492 Encounter for supervision of normal pregnancy, unspecified, second trimester: Secondary | ICD-10-CM | POA: Diagnosis present

## 2021-09-17 DIAGNOSIS — O10012 Pre-existing essential hypertension complicating pregnancy, second trimester: Secondary | ICD-10-CM

## 2021-09-27 ENCOUNTER — Other Ambulatory Visit: Payer: Self-pay | Admitting: *Deleted

## 2021-09-27 DIAGNOSIS — O0990 Supervision of high risk pregnancy, unspecified, unspecified trimester: Secondary | ICD-10-CM

## 2021-09-30 ENCOUNTER — Other Ambulatory Visit: Payer: Self-pay

## 2021-09-30 ENCOUNTER — Other Ambulatory Visit: Payer: Medicaid Other

## 2021-09-30 ENCOUNTER — Ambulatory Visit (INDEPENDENT_AMBULATORY_CARE_PROVIDER_SITE_OTHER): Payer: Medicaid Other | Admitting: Obstetrics and Gynecology

## 2021-09-30 VITALS — BP 126/69 | HR 103 | Wt 273.1 lb

## 2021-09-30 DIAGNOSIS — Z6841 Body Mass Index (BMI) 40.0 and over, adult: Secondary | ICD-10-CM

## 2021-09-30 DIAGNOSIS — O10919 Unspecified pre-existing hypertension complicating pregnancy, unspecified trimester: Secondary | ICD-10-CM

## 2021-09-30 DIAGNOSIS — O0990 Supervision of high risk pregnancy, unspecified, unspecified trimester: Secondary | ICD-10-CM | POA: Diagnosis not present

## 2021-09-30 DIAGNOSIS — Z3A27 27 weeks gestation of pregnancy: Secondary | ICD-10-CM

## 2021-09-30 DIAGNOSIS — Z23 Encounter for immunization: Secondary | ICD-10-CM

## 2021-09-30 DIAGNOSIS — O99212 Obesity complicating pregnancy, second trimester: Secondary | ICD-10-CM

## 2021-09-30 NOTE — Progress Notes (Signed)
° °  PRENATAL VISIT NOTE  Subjective:  Alexandria Fisher is a 30 y.o. [phone] at [redacted]w[redacted]d being seen today for ongoing prenatal care.  She is currently monitored for the following issues for this high-risk pregnancy and has Hemoglobin A-S genotype ([place]); Supervision of high risk pregnancy, antepartum; Chronic hypertension affecting pregnancy; Obesity affecting pregnancy in second trimester; and BMI 40.0-44.9, adult ([place]) on their problem list.  Patient reports occasional contractions.  Contractions: Irritability. Vag. Bleeding: None.  Movement: Present. Denies leaking of fluid.   The following portions of the patient's history were reviewed and updated as appropriate: allergies, current medications, past family history, past medical history, past social history, past surgical history and problem list.   Objective:   Vitals:   09/30/21 0944  BP: 126/69  Pulse: (!) 103  Weight: 273 lb 1.6 oz (123.9 kg)    Fetal Status:     Movement: Present     General:  Alert, oriented and cooperative. Patient is in no acute distress.  Skin: Skin is warm and dry. No rash noted.   Cardiovascular: Normal heart rate noted  Respiratory: Normal respiratory effort, no problems with respiration noted  Abdomen: Soft, gravid, appropriate for gestational age.  Pain/Pressure: Present     Pelvic: Cervical exam deferred        Extremities: Normal range of motion.  Edema: None  Mental Status: Normal mood and affect. Normal behavior. Normal judgment and thought content.   Assessment and Plan:  Pregnancy: [id number] at [redacted]w[redacted]d 1. Supervision of high risk pregnancy, antepartum 28wk labs today. D/w pt more re: birth control nv - Tdap vaccine greater than or equal to 7yo IM  2. Chronic hypertension affecting pregnancy Doing well on no meds. Continue low dose asa Has surveillance growth u/s early January; 12/9: 949gm, 69%, ac 76%, afi wnl  3. Obesity affecting pregnancy in second trimester Weight stable. Goals d/w  her  4. BMI 40.0-44.9, adult (HCC)  5. [redacted] weeks gestation of pregnancy  Preterm labor symptoms and general obstetric precautions including but not limited to vaginal bleeding, contractions, leaking of fluid and fetal movement were reviewed in detail with the patient. Please refer to After Visit Summary for other counseling recommendations.   Return in about 15 days (around 10/15/2021) for in person, md or app, high risk ob.  Future Appointments  Date Time Provider [place]  10/15/2021  3:30 PM [facility] NURSE [facility]  10/15/2021  3:45 PM [facility] US5 [facility]    Aletha Halim, MD

## 2021-10-01 ENCOUNTER — Encounter: Payer: Medicaid Other | Admitting: Obstetrics and Gynecology

## 2021-10-01 LAB — GLUCOSE TOLERANCE, 2 HOURS W/ 1HR
Glucose, 1 hour: 166 mg/dL (ref 70–179)
Glucose, 2 hour: 88 mg/dL (ref 70–152)
Glucose, Fasting: 85 mg/dL (ref 70–91)

## 2021-10-01 LAB — CBC
Hematocrit: 30.5 % — ABNORMAL LOW (ref 34.0–46.6)
Hemoglobin: 10.4 g/dL — ABNORMAL LOW (ref 11.1–15.9)
MCH: 27.1 pg (ref 26.6–33.0)
MCHC: 34.1 g/dL (ref 31.5–35.7)
MCV: 79 fL (ref 79–97)
Platelets: 193 10*3/uL (ref 150–450)
RBC: 3.84 x10E6/uL (ref 3.77–5.28)
RDW: 13.2 % (ref 11.7–15.4)
WBC: 10.2 10*3/uL (ref 3.4–10.8)

## 2021-10-01 LAB — RPR: RPR Ser Ql: NONREACTIVE

## 2021-10-01 LAB — HIV ANTIBODY (ROUTINE TESTING W REFLEX): HIV Screen 4th Generation wRfx: NONREACTIVE

## 2021-10-05 MED ORDER — DOCUSATE SODIUM 100 MG PO CAPS: 100.0000 mg | ORAL_CAPSULE | Freq: Two times a day (BID) | ORAL | 1 refills | Status: DC | PRN

## 2021-10-05 MED ORDER — FERROUS SULFATE 325 (65 FE) MG PO TABS: 325.0000 mg | ORAL_TABLET | ORAL | 0 refills | Status: DC

## 2021-10-10 NOTE — L&D Delivery Note (Signed)
OB/GYN Faculty Practice Delivery Note ? ?Alexandria Fisher is a 31 y.o. [id number] s/p SVD at [redacted]w[redacted]d She was admitted for IOL due to chronic HTN with superimposed pre-eclampsia.  ? ?ROM: 0h 367mith clear fluid ?GBS Status: Positive; received PCN  ? ?Delivery Date/Time: 12/15/21 at 2217 ? ?Delivery: Called to room and patient was complete and pushing. Head delivered ROA. No nuchal cord present. Shoulder and body delivered in usual fashion. Infant with spontaneous cry, placed on mother's abdomen, dried and stimulated. Cord clamped x 2 after 1-minute delay and cut by patient under my direct supervision. Cord blood drawn. Placenta delivered spontaneously with gentle cord traction. Fundus firm with massage and Pitocin. Labia, perineum, vagina, and cervix were inspected, and patient was found to have a 1st degree perineal laceration that was repaired with 2-0 Vicryl and found to be hemostatic.  ? ?Placenta: Intact, 3VC - sent to L&D ?Complications: None  ?Lacerations: 1st degree perineal  ?EBL: 250 cc ?Analgesia: Local 1% lidocaine  ? ?Infant: Viable female  APGARs 9 and 9 ? ?ChVilma MeckelMD ?OB/GYN Fellow, Faculty Practice  ? ? ?

## 2021-10-15 ENCOUNTER — Other Ambulatory Visit: Payer: Self-pay

## 2021-10-15 ENCOUNTER — Ambulatory Visit: Payer: Medicaid Other | Attending: Obstetrics and Gynecology

## 2021-10-15 ENCOUNTER — Ambulatory Visit: Payer: Medicaid Other | Admitting: *Deleted

## 2021-10-15 VITALS — BP 120/70 | HR 98

## 2021-10-15 DIAGNOSIS — D259 Leiomyoma of uterus, unspecified: Secondary | ICD-10-CM | POA: Diagnosis not present

## 2021-10-15 DIAGNOSIS — E669 Obesity, unspecified: Secondary | ICD-10-CM

## 2021-10-15 DIAGNOSIS — O3413 Maternal care for benign tumor of corpus uteri, third trimester: Secondary | ICD-10-CM

## 2021-10-15 DIAGNOSIS — O99213 Obesity complicating pregnancy, third trimester: Secondary | ICD-10-CM

## 2021-10-15 DIAGNOSIS — O99212 Obesity complicating pregnancy, second trimester: Secondary | ICD-10-CM | POA: Insufficient documentation

## 2021-10-15 DIAGNOSIS — O10013 Pre-existing essential hypertension complicating pregnancy, third trimester: Secondary | ICD-10-CM | POA: Diagnosis not present

## 2021-10-15 DIAGNOSIS — O10912 Unspecified pre-existing hypertension complicating pregnancy, second trimester: Secondary | ICD-10-CM | POA: Insufficient documentation

## 2021-10-15 DIAGNOSIS — Z3A29 29 weeks gestation of pregnancy: Secondary | ICD-10-CM

## 2021-10-18 ENCOUNTER — Other Ambulatory Visit: Payer: Self-pay | Admitting: *Deleted

## 2021-10-18 DIAGNOSIS — D259 Leiomyoma of uterus, unspecified: Secondary | ICD-10-CM

## 2021-10-18 DIAGNOSIS — O10913 Unspecified pre-existing hypertension complicating pregnancy, third trimester: Secondary | ICD-10-CM

## 2021-10-18 DIAGNOSIS — Z6841 Body Mass Index (BMI) 40.0 and over, adult: Secondary | ICD-10-CM

## 2021-10-19 ENCOUNTER — Ambulatory Visit (INDEPENDENT_AMBULATORY_CARE_PROVIDER_SITE_OTHER): Payer: Medicaid Other | Admitting: Family Medicine

## 2021-10-19 ENCOUNTER — Other Ambulatory Visit: Payer: Self-pay

## 2021-10-19 ENCOUNTER — Encounter: Payer: Self-pay | Admitting: Family Medicine

## 2021-10-19 VITALS — BP 117/71 | HR 112 | Wt 270.0 lb

## 2021-10-19 DIAGNOSIS — O10919 Unspecified pre-existing hypertension complicating pregnancy, unspecified trimester: Secondary | ICD-10-CM

## 2021-10-19 DIAGNOSIS — O0990 Supervision of high risk pregnancy, unspecified, unspecified trimester: Secondary | ICD-10-CM

## 2021-10-19 NOTE — Patient Instructions (Signed)

## 2021-10-19 NOTE — Progress Notes (Signed)
° °  Subjective:  Alexandria Fisher is a 31 y.o. [phone] at [redacted]w[redacted]d being seen today for ongoing prenatal care.  She is currently monitored for the following issues for this high-risk pregnancy and has Hemoglobin A-S genotype ([place]); Supervision of high risk pregnancy, antepartum; Chronic hypertension affecting pregnancy; Obesity affecting pregnancy in second trimester; and BMI 40.0-44.9, adult ([place]) on their problem list.  Patient reports no complaints.  Contractions: Not present. Vag. Bleeding: None.  Movement: Present. Denies leaking of fluid.   The following portions of the patient's history were reviewed and updated as appropriate: allergies, current medications, past family history, past medical history, past social history, past surgical history and problem list. Problem list updated.  Objective:   Vitals:   10/19/21 1108  BP: 117/71  Pulse: (!) 112  Weight: 270 lb (122.5 kg)    Fetal Status: Fetal Heart Rate (bpm): 149   Movement: Present     General:  Alert, oriented and cooperative. Patient is in no acute distress.  Skin: Skin is warm and dry. No rash noted.   Cardiovascular: Normal heart rate noted  Respiratory: Normal respiratory effort, no problems with respiration noted  Abdomen: Soft, gravid, appropriate for gestational age. Pain/Pressure: Present     Pelvic: Vag. Bleeding: None     Cervical exam deferred        Extremities: Normal range of motion.  Edema: None  Mental Status: Normal mood and affect. Normal behavior. Normal judgment and thought content.   Urinalysis:      Assessment and Plan:  Pregnancy: [id number] at [redacted]w[redacted]d  1. Supervision of high risk pregnancy, antepartum BP and FHR normal  2. Chronic hypertension affecting pregnancy Well controlled no meds, on ASA Growth US done last week, normal EFW Follow up scheduled along with BPP at 36 weeks for maternal obesity  Preterm labor symptoms and general obstetric precautions including but not limited to vaginal  bleeding, contractions, leaking of fluid and fetal movement were reviewed in detail with the patient. Please refer to After Visit Summary for other counseling recommendations.  Return in 2 weeks (on 11/02/2021) for [facility], ob visit.   Clarnce Flock, MD

## 2021-10-24 ENCOUNTER — Encounter: Payer: Self-pay | Admitting: Physician Assistant

## 2021-11-02 ENCOUNTER — Other Ambulatory Visit (HOSPITAL_COMMUNITY)
Admission: RE | Admit: 2021-11-02 | Discharge: 2021-11-02 | Disposition: A | Discharge status: Home / Self Care | Payer: Medicaid Other | Source: Ambulatory Visit | Attending: Family Medicine | Admitting: Family Medicine

## 2021-11-02 ENCOUNTER — Other Ambulatory Visit: Payer: Self-pay

## 2021-11-02 ENCOUNTER — Ambulatory Visit (INDEPENDENT_AMBULATORY_CARE_PROVIDER_SITE_OTHER): Payer: Medicaid Other | Admitting: Family Medicine

## 2021-11-02 VITALS — BP 121/74 | HR 101 | Wt 268.0 lb

## 2021-11-02 DIAGNOSIS — Z3A32 32 weeks gestation of pregnancy: Secondary | ICD-10-CM

## 2021-11-02 DIAGNOSIS — N898 Other specified noninflammatory disorders of vagina: Secondary | ICD-10-CM

## 2021-11-02 DIAGNOSIS — O0990 Supervision of high risk pregnancy, unspecified, unspecified trimester: Secondary | ICD-10-CM

## 2021-11-02 DIAGNOSIS — O10919 Unspecified pre-existing hypertension complicating pregnancy, unspecified trimester: Secondary | ICD-10-CM

## 2021-11-02 DIAGNOSIS — Z3009 Encounter for other general counseling and advice on contraception: Secondary | ICD-10-CM

## 2021-11-02 NOTE — Progress Notes (Signed)
° ° °  Subjective:  Alexandria Fisher is a 31 y.o. [phone] at [redacted]w[redacted]d being seen today for ongoing prenatal care.  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  Patient reports no complaints.  Contractions: Not present.  .  Movement: Present. Denies leaking of fluid.   The following portions of the patient's history were reviewed and updated as appropriate: allergies, current medications, past family history, past medical history, past social history, past surgical history and problem list. Problem list updated.  Objective:   Vitals:   11/02/21 1541  BP: 121/74  Pulse: (!) 101  Weight: 268 lb (121.6 kg)    Fetal Status: Fetal Heart Rate (bpm): 150   Movement: Present     General:  Alert, oriented and cooperative. Patient is in no acute distress.  Skin: Skin is warm and dry. No rash noted.   Cardiovascular: Normal heart rate noted  Respiratory: Normal respiratory effort, no problems with respiration noted  Abdomen: Soft, gravid, appropriate for gestational age. Pain/Pressure: Present     Pelvic:       Cervical exam deferred        Extremities: Normal range of motion.  Edema: None  Mental Status: Normal mood and affect. Normal behavior. Normal judgment and thought content.   Urinalysis:      Assessment and Plan:  Pregnancy: [id number] at [redacted]w[redacted]d  1. Supervision of high risk pregnancy, antepartum 3. [redacted] weeks gestation of pregnancy Doing well. No acute concerns today. Normal fetal heart rate.  - f/up 2 weeks   2. Chronic hypertension affecting pregnancy Currently not on meds. BP at home 120s/60. Here BP 121/74. Last growth [place] with EFW 50%ile on 1/6. Has BPP and F/up growth [place] already scheduled  - taking ASA  4. General  counseling and advice on female contraception Desires BTL, papers signed today  5. Vaginal itching For few days. Yellowish discharge. Would like to do self swab today. - Vaginal swab sent  Preterm labor symptoms and general obstetric precautions including but not limited to vaginal bleeding, contractions, leaking of fluid and fetal movement were reviewed in detail with the patient. Please refer to After Visit Summary for other counseling recommendations.  Return in about 2 weeks (around 11/16/2021) for [facility].   Renard Matter, MD, MPH OB Fellow, [facility]

## 2021-11-02 NOTE — Progress Notes (Signed)
Patient reports pain in lower stomach. She stated that it started today and proceeds to come and go. Pain is about an 7 on scale 1-10.   Denies any vaginal bleeding but stated that there has been some vaginal burning/irritation

## 2021-11-03 LAB — CERVICOVAGINAL ANCILLARY ONLY
Bacterial Vaginitis (gardnerella): NEGATIVE
Candida Glabrata: NEGATIVE
Candida Vaginitis: POSITIVE — AB
Comment: NEGATIVE
Comment: NEGATIVE
Comment: NEGATIVE
Comment: NEGATIVE
Trichomonas: NEGATIVE

## 2021-11-05 ENCOUNTER — Other Ambulatory Visit: Payer: Self-pay | Admitting: Family Medicine

## 2021-11-05 MED ORDER — TERCONAZOLE 0.8 % VA CREA: 1.0000 | TOPICAL_CREAM | Freq: Every day | VAGINAL | 0 refills | Status: AC

## 2021-11-08 ENCOUNTER — Other Ambulatory Visit: Payer: Self-pay

## 2021-11-08 MED ORDER — FLUCONAZOLE 150 MG PO TABS: 150.0000 mg | ORAL_TABLET | Freq: Once | ORAL | 0 refills | Status: AC

## 2021-11-16 ENCOUNTER — Encounter: Payer: Self-pay | Admitting: Family Medicine

## 2021-11-16 ENCOUNTER — Telehealth (INDEPENDENT_AMBULATORY_CARE_PROVIDER_SITE_OTHER): Payer: Medicaid Other | Admitting: Family Medicine

## 2021-11-16 ENCOUNTER — Other Ambulatory Visit: Payer: Self-pay

## 2021-11-16 VITALS — BP 126/83 | HR 94

## 2021-11-16 DIAGNOSIS — O10913 Unspecified pre-existing hypertension complicating pregnancy, third trimester: Secondary | ICD-10-CM

## 2021-11-16 DIAGNOSIS — O99013 Anemia complicating pregnancy, third trimester: Secondary | ICD-10-CM

## 2021-11-16 DIAGNOSIS — O0993 Supervision of high risk pregnancy, unspecified, third trimester: Secondary | ICD-10-CM

## 2021-11-16 DIAGNOSIS — O99212 Obesity complicating pregnancy, second trimester: Secondary | ICD-10-CM

## 2021-11-16 DIAGNOSIS — Z3A34 34 weeks gestation of pregnancy: Secondary | ICD-10-CM

## 2021-11-16 DIAGNOSIS — O0990 Supervision of high risk pregnancy, unspecified, unspecified trimester: Secondary | ICD-10-CM

## 2021-11-16 DIAGNOSIS — D573 Sickle-cell trait: Secondary | ICD-10-CM

## 2021-11-16 DIAGNOSIS — O99213 Obesity complicating pregnancy, third trimester: Secondary | ICD-10-CM

## 2021-11-16 DIAGNOSIS — O10919 Unspecified pre-existing hypertension complicating pregnancy, unspecified trimester: Secondary | ICD-10-CM

## 2021-11-16 NOTE — Progress Notes (Signed)
° °  Subjective:  Alexandria Fisher is a 31 y.o. [phone] at [redacted]w[redacted]d being seen today for ongoing prenatal care.  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  Patient reports no complaints.  Contractions: Not present. Vag. Bleeding: None.  Movement: Present. Denies leaking of fluid.   The following portions of the patient's history were reviewed and updated as appropriate: allergies, current medications, past family history, past medical history, past social history, past surgical history and problem list. Problem list updated.  Objective:   Vitals:   11/16/21 1616  BP: 126/83  Pulse: 94    Fetal Status:     Movement: Present     General:  Alert, oriented and cooperative. Patient is in no acute distress.  Skin: Skin is warm and dry. No rash noted.   Cardiovascular: Normal heart rate noted  Respiratory: Normal respiratory effort, no problems with respiration noted  Abdomen: Soft, gravid, appropriate for gestational age. Pain/Pressure: Absent     Pelvic: Vag. Bleeding: None     Cervical exam deferred        Extremities: Normal range of motion.     Mental Status: Normal mood and affect. Normal behavior. Normal judgment and thought content.   Urinalysis:      Assessment and Plan:  Pregnancy: [id number] at [redacted]w[redacted]d  1. Supervision of high risk pregnancy, antepartum BP normal Good fetal movement  2. Chronic hypertension affecting pregnancy Well controlled no meds Compliant with aspirin  3. 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 Obesity affecting pregnancy in second trimester   Preterm labor symptoms and general obstetric precautions including but not limited to vaginal bleeding, contractions, leaking of fluid and fetal  movement were reviewed in detail with the patient. Please refer to After Visit Summary for other counseling recommendations.  Return in 2 weeks (on 11/30/2021) for [facility], ob visit, in person.   Clarnce Flock, MD

## 2021-11-16 NOTE — Patient Instructions (Signed)

## 2021-11-19 ENCOUNTER — Other Ambulatory Visit: Payer: Self-pay | Admitting: Obstetrics and Gynecology

## 2021-11-19 ENCOUNTER — Other Ambulatory Visit: Payer: Self-pay

## 2021-11-19 ENCOUNTER — Encounter: Payer: Self-pay | Admitting: *Deleted

## 2021-11-19 ENCOUNTER — Ambulatory Visit: Payer: Medicaid Other | Admitting: *Deleted

## 2021-11-19 ENCOUNTER — Ambulatory Visit: Payer: Medicaid Other | Attending: Obstetrics and Gynecology

## 2021-11-19 VITALS — BP 122/72 | HR 108

## 2021-11-19 DIAGNOSIS — Z6841 Body Mass Index (BMI) 40.0 and over, adult: Secondary | ICD-10-CM

## 2021-11-19 DIAGNOSIS — D259 Leiomyoma of uterus, unspecified: Secondary | ICD-10-CM

## 2021-11-19 DIAGNOSIS — O3413 Maternal care for benign tumor of corpus uteri, third trimester: Secondary | ICD-10-CM | POA: Insufficient documentation

## 2021-11-19 DIAGNOSIS — O10013 Pre-existing essential hypertension complicating pregnancy, third trimester: Secondary | ICD-10-CM | POA: Diagnosis not present

## 2021-11-19 DIAGNOSIS — O10913 Unspecified pre-existing hypertension complicating pregnancy, third trimester: Secondary | ICD-10-CM

## 2021-11-19 DIAGNOSIS — Z3A34 34 weeks gestation of pregnancy: Secondary | ICD-10-CM | POA: Diagnosis not present

## 2021-11-19 DIAGNOSIS — O99213 Obesity complicating pregnancy, third trimester: Secondary | ICD-10-CM | POA: Insufficient documentation

## 2021-11-26 ENCOUNTER — Ambulatory Visit: Payer: Medicaid Other | Admitting: *Deleted

## 2021-11-26 ENCOUNTER — Other Ambulatory Visit: Payer: Self-pay

## 2021-11-26 ENCOUNTER — Ambulatory Visit: Payer: Medicaid Other | Attending: Obstetrics and Gynecology

## 2021-11-26 VITALS — BP 118/72 | HR 89

## 2021-11-26 DIAGNOSIS — O10013 Pre-existing essential hypertension complicating pregnancy, third trimester: Secondary | ICD-10-CM

## 2021-11-26 DIAGNOSIS — O99213 Obesity complicating pregnancy, third trimester: Secondary | ICD-10-CM | POA: Diagnosis present

## 2021-11-26 DIAGNOSIS — O3413 Maternal care for benign tumor of corpus uteri, third trimester: Secondary | ICD-10-CM

## 2021-11-26 DIAGNOSIS — Z3A35 35 weeks gestation of pregnancy: Secondary | ICD-10-CM | POA: Diagnosis not present

## 2021-11-26 DIAGNOSIS — D259 Leiomyoma of uterus, unspecified: Secondary | ICD-10-CM | POA: Diagnosis not present

## 2021-11-26 DIAGNOSIS — Z3A00 Weeks of gestation of pregnancy not specified: Secondary | ICD-10-CM | POA: Insufficient documentation

## 2021-11-26 DIAGNOSIS — O10913 Unspecified pre-existing hypertension complicating pregnancy, third trimester: Secondary | ICD-10-CM | POA: Insufficient documentation

## 2021-11-26 DIAGNOSIS — Z6841 Body Mass Index (BMI) 40.0 and over, adult: Secondary | ICD-10-CM

## 2021-11-30 ENCOUNTER — Other Ambulatory Visit: Payer: Self-pay

## 2021-11-30 ENCOUNTER — Encounter: Payer: Self-pay | Admitting: Family Medicine

## 2021-11-30 ENCOUNTER — Other Ambulatory Visit (HOSPITAL_COMMUNITY)
Admission: RE | Admit: 2021-11-30 | Discharge: 2021-11-30 | Disposition: A | Discharge status: Home / Self Care | Payer: Medicaid Other | Source: Ambulatory Visit | Attending: Family Medicine | Admitting: Family Medicine

## 2021-11-30 ENCOUNTER — Ambulatory Visit (INDEPENDENT_AMBULATORY_CARE_PROVIDER_SITE_OTHER): Payer: Medicaid Other | Admitting: Family Medicine

## 2021-11-30 VITALS — BP 118/80 | HR 108 | Wt 271.0 lb

## 2021-11-30 DIAGNOSIS — O0990 Supervision of high risk pregnancy, unspecified, unspecified trimester: Secondary | ICD-10-CM

## 2021-11-30 DIAGNOSIS — O99212 Obesity complicating pregnancy, second trimester: Secondary | ICD-10-CM

## 2021-11-30 DIAGNOSIS — D573 Sickle-cell trait: Secondary | ICD-10-CM

## 2021-11-30 DIAGNOSIS — O10919 Unspecified pre-existing hypertension complicating pregnancy, unspecified trimester: Secondary | ICD-10-CM

## 2021-11-30 NOTE — Progress Notes (Signed)
° °  Subjective:  Alexandria Fisher is a 31 y.o. [phone] at [redacted]w[redacted]d being seen today for ongoing prenatal care.  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  Patient reports  difficulty sleeping .  Contractions: Irritability. Vag. Bleeding: None.  Movement: Present. Denies leaking of fluid.   The following portions of the patient's history were reviewed and updated as appropriate: allergies, current medications, past family history, past medical history, past social history, past surgical history and problem list. Problem list updated.  Objective:   Vitals:   11/30/21 1612  BP: 118/80  Pulse: (!) 108  Weight: 271 lb (122.9 kg)    Fetal Status: Fetal Heart Rate (bpm): 134   Movement: Present     General:  Alert, oriented and cooperative. Patient is in no acute distress.  Skin: Skin is warm and dry. No rash noted.   Cardiovascular: Normal heart rate noted  Respiratory: Normal respiratory effort, no problems with respiration noted  Abdomen: Soft, gravid, appropriate for gestational age. Pain/Pressure: Present     Pelvic: Vag. Bleeding: None     Cervical exam deferred        Extremities: Normal range of motion.  Edema: Trace  Mental Status: Normal mood and affect. Normal behavior. Normal judgment and thought content.   Urinalysis:      Assessment and Plan:  Pregnancy: [id number] at [redacted]w[redacted]d  1. Supervision of high risk pregnancy, antepartum BP and FHR normal Swabs today Cephalic on most recent BPP - Culture, beta strep (group b only) - GC/Chlamydia probe amp ([place])not at [facility]  2. Chronic hypertension affecting pregnancy Well controlled no meds Discussed rec for IOL at 39 weeks, form faxed and orders placed  3.  Obesity affecting pregnancy in second trimester Following w MFM Antenatal testing reassuring to date  4. 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 Please refer to After Visit Summary for other counseling recommendations.  Return in 1 week (on 12/07/2021) for [facility], ob visit.   Clarnce Flock, MD

## 2021-11-30 NOTE — Patient Instructions (Signed)

## 2021-11-30 NOTE — Progress Notes (Signed)
Pt states is having problems sleeping.

## 2021-12-01 LAB — GC/CHLAMYDIA PROBE AMP (~~LOC~~) NOT AT ARMC
Chlamydia: NEGATIVE
Comment: NEGATIVE
Comment: NORMAL
Neisseria Gonorrhea: NEGATIVE

## 2021-12-03 ENCOUNTER — Other Ambulatory Visit: Payer: Self-pay

## 2021-12-03 ENCOUNTER — Ambulatory Visit: Payer: Medicaid Other | Admitting: *Deleted

## 2021-12-03 ENCOUNTER — Ambulatory Visit: Payer: Medicaid Other | Attending: Obstetrics and Gynecology

## 2021-12-03 VITALS — BP 124/69 | HR 92

## 2021-12-03 DIAGNOSIS — Z6841 Body Mass Index (BMI) 40.0 and over, adult: Secondary | ICD-10-CM | POA: Diagnosis not present

## 2021-12-03 DIAGNOSIS — D259 Leiomyoma of uterus, unspecified: Secondary | ICD-10-CM

## 2021-12-03 DIAGNOSIS — O10913 Unspecified pre-existing hypertension complicating pregnancy, third trimester: Secondary | ICD-10-CM | POA: Insufficient documentation

## 2021-12-03 DIAGNOSIS — O10013 Pre-existing essential hypertension complicating pregnancy, third trimester: Secondary | ICD-10-CM | POA: Diagnosis not present

## 2021-12-03 DIAGNOSIS — Z3A36 36 weeks gestation of pregnancy: Secondary | ICD-10-CM

## 2021-12-03 DIAGNOSIS — O3413 Maternal care for benign tumor of corpus uteri, third trimester: Secondary | ICD-10-CM

## 2021-12-03 DIAGNOSIS — O99213 Obesity complicating pregnancy, third trimester: Secondary | ICD-10-CM

## 2021-12-04 LAB — CULTURE, BETA STREP (GROUP B ONLY): Strep Gp B Culture: POSITIVE — AB

## 2021-12-05 ENCOUNTER — Encounter: Payer: Self-pay | Admitting: Family Medicine

## 2021-12-06 ENCOUNTER — Other Ambulatory Visit: Payer: Self-pay | Admitting: *Deleted

## 2021-12-06 DIAGNOSIS — R638 Other symptoms and signs concerning food and fluid intake: Secondary | ICD-10-CM

## 2021-12-06 DIAGNOSIS — O10913 Unspecified pre-existing hypertension complicating pregnancy, third trimester: Secondary | ICD-10-CM

## 2021-12-07 ENCOUNTER — Telehealth: Payer: Self-pay | Admitting: Family Medicine

## 2021-12-07 NOTE — Telephone Encounter (Signed)
Patient called saying she is worried because she is pregnant but she has not been feeling the baby move. She would like to speak with a nurse ASAP. She is high risk and has an HOB appt. on 12/09/21.

## 2021-12-07 NOTE — Telephone Encounter (Signed)
Sent pt message to go to MAU for evaluation.  Alexandria Fisher

## 2021-12-08 ENCOUNTER — Ambulatory Visit: Payer: Medicaid Other

## 2021-12-09 ENCOUNTER — Ambulatory Visit (INDEPENDENT_AMBULATORY_CARE_PROVIDER_SITE_OTHER): Payer: Medicaid Other | Admitting: Obstetrics and Gynecology

## 2021-12-09 ENCOUNTER — Ambulatory Visit: Payer: Medicaid Other | Attending: Obstetrics and Gynecology

## 2021-12-09 ENCOUNTER — Ambulatory Visit: Payer: Medicaid Other | Admitting: *Deleted

## 2021-12-09 ENCOUNTER — Encounter: Payer: Self-pay | Admitting: Obstetrics and Gynecology

## 2021-12-09 ENCOUNTER — Encounter: Payer: Self-pay | Admitting: *Deleted

## 2021-12-09 ENCOUNTER — Other Ambulatory Visit: Payer: Self-pay

## 2021-12-09 VITALS — BP 127/71 | HR 96

## 2021-12-09 VITALS — BP 127/71 | HR 96 | Wt 272.5 lb

## 2021-12-09 DIAGNOSIS — O10919 Unspecified pre-existing hypertension complicating pregnancy, unspecified trimester: Secondary | ICD-10-CM

## 2021-12-09 DIAGNOSIS — D259 Leiomyoma of uterus, unspecified: Secondary | ICD-10-CM | POA: Diagnosis not present

## 2021-12-09 DIAGNOSIS — Z6841 Body Mass Index (BMI) 40.0 and over, adult: Secondary | ICD-10-CM

## 2021-12-09 DIAGNOSIS — O99213 Obesity complicating pregnancy, third trimester: Secondary | ICD-10-CM

## 2021-12-09 DIAGNOSIS — O3413 Maternal care for benign tumor of corpus uteri, third trimester: Secondary | ICD-10-CM | POA: Insufficient documentation

## 2021-12-09 DIAGNOSIS — O10013 Pre-existing essential hypertension complicating pregnancy, third trimester: Secondary | ICD-10-CM | POA: Diagnosis not present

## 2021-12-09 DIAGNOSIS — O10913 Unspecified pre-existing hypertension complicating pregnancy, third trimester: Secondary | ICD-10-CM | POA: Diagnosis not present

## 2021-12-09 DIAGNOSIS — Z3A37 37 weeks gestation of pregnancy: Secondary | ICD-10-CM | POA: Insufficient documentation

## 2021-12-09 DIAGNOSIS — R638 Other symptoms and signs concerning food and fluid intake: Secondary | ICD-10-CM

## 2021-12-09 DIAGNOSIS — O0990 Supervision of high risk pregnancy, unspecified, unspecified trimester: Secondary | ICD-10-CM

## 2021-12-09 DIAGNOSIS — Z362 Encounter for other antenatal screening follow-up: Secondary | ICD-10-CM

## 2021-12-09 DIAGNOSIS — O99820 Streptococcus B carrier state complicating pregnancy: Secondary | ICD-10-CM

## 2021-12-09 NOTE — Progress Notes (Signed)
Subjective:  ?Alexandria Fisher is a 31 y.o. [phone] at [redacted]w[redacted]d being seen today for ongoing prenatal care.  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 ? ?Patient reports general discomforts of pregnancy.  Contractions: Irritability. Vag. Bleeding: None.  Movement: Present. Denies leaking of fluid.  ? ?The following portions of the patient's history were reviewed and updated as appropriate: allergies, current medications, past family history, past medical history, past social history, past surgical history and problem list. Problem list updated. ? ?Objective:  ? ?Vitals:  ? 12/09/21 1541  ?BP: 127/71  ?Pulse: 96  ?Weight: 272 lb 8 oz (123.6 kg)  ? ? ?Fetal Status: Fetal Heart Rate (bpm): 149   Movement: Present    ? ?General:  Alert, oriented and cooperative. Patient is in no acute distress.  ?Skin: Skin is warm and dry. No rash noted.   ?Cardiovascular: Normal heart rate noted  ?Respiratory: Normal respiratory effort, no problems with respiration noted  ?Abdomen: Soft, gravid, appropriate for gestational age. Pain/Pressure: Present     ?Pelvic:  Cervical exam performed        ?Extremities: Normal range of motion.  Edema: Trace  ?Mental Status: Normal mood and affect. Normal behavior. Normal judgment and thought content.  ? ?Urinalysis:     ? ?Assessment and Plan:  ?Pregnancy: [id number] at [redacted]w[redacted]d ? ?1. Supervision of high risk pregnancy, antepartum ?Stable ?Labor precautions ? ?2. Chronic hypertension affecting pregnancy ?BP stable ?Growth scan today 69 % ?IOL at 39 weeks, scheduled ?Continue with weekly antenatal testing ? ?3. GBS (group B Streptococcus carrier), +RV culture, currently  pregnant ?Tx while in labor ? ?Term labor symptoms and general obstetric precautions including but not limited to vaginal bleeding, contractions, leaking of fluid and fetal movement were reviewed in detail with the patient. ?Please refer to After Visit Summary for other counseling recommendations.  ?Return in about 1 week (around 12/16/2021) for OB visit, face to face, MD only. ? ? ?Chancy Milroy, MD ?

## 2021-12-09 NOTE — Patient Instructions (Signed)
Vaginal Delivery ?Vaginal delivery means that you give birth by pushing your baby out of your birth canal (vagina). Your health care team will help you before, during, and after vaginal delivery. ?Birth experiences are unique for every woman and every pregnancy, and birth experiences vary depending on where you choose to give birth. ?What are the risks and benefits? ?Generally, this is safe. However, problems may occur, including: ?Bleeding. ?Infection. ?Damage to other structures such as vaginal tearing. ?Allergic reactions to medicines. ?Despite the risks, benefits of vaginal delivery include less risk of bleeding and infection and a shorter recovery time compared to a Cesarean delivery. Cesarean delivery, or C-section, is the surgical delivery of a baby. ?What happens when I arrive at the birth center or hospital? ?Once you are in labor and have been admitted into the hospital or birth center, your health care team may: ?Review your pregnancy history and any concerns that you have. ?Talk with you about your birth plan and discuss pain control options. ?Check your blood pressure, breathing, and heartbeat. ?Assess your baby's heartbeat. ?Monitor your uterus for contractions. ?Check whether your bag of water (amniotic sac) has broken (ruptured). ?Insert an IV into one of your veins. This may be used to give you fluids and medicines. ?Monitoring ?Your health care team may assess your contractions (uterine monitoring) and your baby's heart rate (fetal monitoring). You may need to be monitored: ?Often, but not continuously (intermittently). ?All the time or for long periods at a time (continuously). Continuous monitoring may be needed if: ?You are taking certain medicines, such as medicine to relieve pain or make your contractions stronger. ?You have pregnancy or labor complications. ?Monitoring may be done by: ?Placing a special stethoscope or a handheld monitoring device on your abdomen to check your baby's heartbeat  and to check for contractions. ?Placing monitors on your abdomen (external monitors) to record your baby's heartbeat and the frequency and length of contractions. ?Placing monitors inside your uterus through your vagina (internal monitors) to record your baby's heartbeat and the frequency, length, and strength of your contractions. Depending on the type of monitor, it may remain in your uterus or on your baby's head until birth. ?Telemetry. This is a type of continuous monitoring that can be done with external or internal monitors. Instead of having to stay in bed, you are able to move around. ?Physical exam ?Your health care team may perform frequent physical exams. This may include: ?Checking how and where your baby is positioned in your uterus. ?Checking your cervix to determine: ?Whether it is thinning out (effacing). ?Whether it is opening up (dilating). ?What happens during labor and delivery? ?Normal labor and delivery is divided into the following three stages: ?Stage 1 ?This is the longest stage of labor. ?Throughout this stage, you will feel contractions. Contractions generally feel mild, infrequent, and irregular at first. They get stronger, more frequent, and more regular as you move through this stage. You may have contractions about every 2-3 minutes. ?This stage ends when your cervix is completely dilated to 4 inches (10 cm) and completely effaced. ?Stage 2 ?This stage starts once your cervix is completely effaced and dilated and lasts until the delivery of your baby. ?This is the stage where you will feel an urge to push your baby out of your vagina. ?You may feel stretching and burning pain, especially when the widest part of your baby's head passes through the vaginal opening (crowning). ?Once your baby is delivered, the umbilical cord will be clamped and  cut. Timing of cutting the cord will depend on your wishes, your baby's health, and your health care provider's practices. ?Your baby will be  placed on your bare chest (skin-to-skin contact) in an upright position and covered with a warm blanket. If you are choosing to breastfeed, watch your baby for feeding cues, like rooting or sucking, and help the baby to your breast for his or her first feeding. ?Stage 3 ?This stage starts immediately after the birth of your baby and ends after you deliver the placenta. ?This stage may take anywhere from 5 to 30 minutes. ?After your baby has been delivered, you will feel contractions as your body expels the placenta. These contractions also help your uterus get smaller and reduce bleeding. ?What can I expect after labor and delivery? ?After labor is over, you and your baby will be assessed closely until you are ready to go home. Your health care team will teach you how to care for yourself and your baby. ?You and your baby may be encouraged to stay in the same room (rooming in) during your hospital stay. This will help promote early bonding and successful breastfeeding. ?Your uterus will be checked and massaged regularly (fundal massage). ?You may continue to receive fluids and medicines through an IV. ?You will have some soreness and pain in your abdomen, vagina, and the area of skin between your vaginal opening and your anus (perineum). ?If an incision was made near your vagina (episiotomy) or if you had some vaginal tearing during delivery, cold compresses may be placed on your episiotomy or your tear. This helps to reduce pain and swelling. ?It is normal to have vaginal bleeding after delivery. Wear a sanitary pad for vaginal bleeding and discharge. ?Summary ?Vaginal delivery means that you will give birth by pushing your baby out of your birth canal (vagina). ?Your health care team will monitor you and your baby throughout the stages of labor. ?After you deliver your baby, your health care team will continue to assess you and your baby to ensure you are both recovering as expected after delivery. ?This  information is not intended to replace advice given to you by your health care provider. Make sure you discuss any questions you have with your health care provider. ?Document Revised: 08/24/2020 Document Reviewed: 08/24/2020 ?Elsevier Patient Education ? [place]. ? ?

## 2021-12-15 ENCOUNTER — Ambulatory Visit (INDEPENDENT_AMBULATORY_CARE_PROVIDER_SITE_OTHER): Payer: Medicaid Other | Admitting: Family Medicine

## 2021-12-15 ENCOUNTER — Inpatient Hospital Stay (HOSPITAL_COMMUNITY)
Admission: AD | Admit: 2021-12-15 | Discharge: 2021-12-17 | DRG: 807 | Disposition: A | Discharge status: Home / Self Care | Payer: Medicaid Other | Attending: Obstetrics & Gynecology | Admitting: Obstetrics & Gynecology

## 2021-12-15 ENCOUNTER — Encounter: Payer: Self-pay | Admitting: Family Medicine

## 2021-12-15 ENCOUNTER — Other Ambulatory Visit: Payer: Self-pay

## 2021-12-15 ENCOUNTER — Encounter (HOSPITAL_COMMUNITY): Payer: Self-pay | Admitting: Obstetrics and Gynecology

## 2021-12-15 VITALS — BP 144/83 | HR 71 | Wt 272.6 lb

## 2021-12-15 DIAGNOSIS — O0990 Supervision of high risk pregnancy, unspecified, unspecified trimester: Secondary | ICD-10-CM

## 2021-12-15 DIAGNOSIS — O1002 Pre-existing essential hypertension complicating childbirth: Secondary | ICD-10-CM | POA: Diagnosis present

## 2021-12-15 DIAGNOSIS — Z7982 Long term (current) use of aspirin: Secondary | ICD-10-CM

## 2021-12-15 DIAGNOSIS — Z20822 Contact with and (suspected) exposure to covid-19: Secondary | ICD-10-CM | POA: Diagnosis present

## 2021-12-15 DIAGNOSIS — O9902 Anemia complicating childbirth: Secondary | ICD-10-CM | POA: Diagnosis present

## 2021-12-15 DIAGNOSIS — Z87891 Personal history of nicotine dependence: Secondary | ICD-10-CM

## 2021-12-15 DIAGNOSIS — O10919 Unspecified pre-existing hypertension complicating pregnancy, unspecified trimester: Secondary | ICD-10-CM

## 2021-12-15 DIAGNOSIS — Z6841 Body Mass Index (BMI) 40.0 and over, adult: Secondary | ICD-10-CM

## 2021-12-15 DIAGNOSIS — O99820 Streptococcus B carrier state complicating pregnancy: Secondary | ICD-10-CM

## 2021-12-15 DIAGNOSIS — O114 Pre-existing hypertension with pre-eclampsia, complicating childbirth: Principal | ICD-10-CM | POA: Diagnosis present

## 2021-12-15 DIAGNOSIS — O99824 Streptococcus B carrier state complicating childbirth: Secondary | ICD-10-CM | POA: Diagnosis present

## 2021-12-15 DIAGNOSIS — Z3A38 38 weeks gestation of pregnancy: Secondary | ICD-10-CM | POA: Diagnosis not present

## 2021-12-15 DIAGNOSIS — D509 Iron deficiency anemia, unspecified: Secondary | ICD-10-CM | POA: Diagnosis present

## 2021-12-15 DIAGNOSIS — O99214 Obesity complicating childbirth: Secondary | ICD-10-CM | POA: Diagnosis present

## 2021-12-15 DIAGNOSIS — O99019 Anemia complicating pregnancy, unspecified trimester: Secondary | ICD-10-CM | POA: Diagnosis present

## 2021-12-15 DIAGNOSIS — O119 Pre-existing hypertension with pre-eclampsia, unspecified trimester: Secondary | ICD-10-CM | POA: Diagnosis present

## 2021-12-15 DIAGNOSIS — O99212 Obesity complicating pregnancy, second trimester: Secondary | ICD-10-CM

## 2021-12-15 DIAGNOSIS — Z8614 Personal history of Methicillin resistant Staphylococcus aureus infection: Secondary | ICD-10-CM

## 2021-12-15 DIAGNOSIS — D573 Sickle-cell trait: Secondary | ICD-10-CM | POA: Diagnosis present

## 2021-12-15 DIAGNOSIS — O1404 Mild to moderate pre-eclampsia, complicating childbirth: Secondary | ICD-10-CM | POA: Diagnosis not present

## 2021-12-15 LAB — CBC
HCT: 30.2 % — ABNORMAL LOW (ref 36.0–46.0)
Hemoglobin: 9.7 g/dL — ABNORMAL LOW (ref 12.0–15.0)
MCH: 25.7 pg — ABNORMAL LOW (ref 26.0–34.0)
MCHC: 32.1 g/dL (ref 30.0–36.0)
MCV: 79.9 fL — ABNORMAL LOW (ref 80.0–100.0)
Platelets: 194 10*3/uL (ref 150–400)
RBC: 3.78 MIL/uL — ABNORMAL LOW (ref 3.87–5.11)
RDW: 13.9 % (ref 11.5–15.5)
WBC: 9.3 10*3/uL (ref 4.0–10.5)
nRBC: 0 % (ref 0.0–0.2)

## 2021-12-15 LAB — COMPREHENSIVE METABOLIC PANEL
ALT: 9 U/L (ref 0–44)
AST: 15 U/L (ref 15–41)
Albumin: 2.5 g/dL — ABNORMAL LOW (ref 3.5–5.0)
Alkaline Phosphatase: 104 U/L (ref 38–126)
Anion gap: 10 (ref 5–15)
BUN: <5 mg/dL — ABNORMAL LOW (ref 6–20)
CO2: 24 mmol/L (ref 22–32)
Calcium: 8.9 mg/dL (ref 8.9–10.3)
Chloride: 104 mmol/L (ref 98–111)
Creatinine, Ser: 0.66 mg/dL (ref 0.44–1.00)
GFR, Estimated: >60 mL/min (ref >60)
Glucose, Bld: 105 mg/dL — ABNORMAL HIGH (ref 70–99)
Potassium: 3.6 mmol/L (ref 3.5–5.1)
Sodium: 138 mmol/L (ref 135–145)
Total Bilirubin: 0.4 mg/dL (ref 0.3–1.2)
Total Protein: 6.4 g/dL — ABNORMAL LOW (ref 6.5–8.1)

## 2021-12-15 LAB — RESP PANEL BY RT-PCR (FLU A&B, COVID) ARPGX2
Influenza A by PCR: NEGATIVE
Influenza B by PCR: NEGATIVE
SARS Coronavirus 2 by RT PCR: NEGATIVE

## 2021-12-15 LAB — PROTEIN / CREATININE RATIO, URINE
Creatinine, Urine: 65.19 mg/dL
Protein Creatinine Ratio: 0.32 mg/mg{Cre} — ABNORMAL HIGH (ref 0.00–0.15)
Total Protein, Urine: 21 mg/dL

## 2021-12-15 LAB — TYPE AND SCREEN
ABO/RH(D): B POS
Antibody Screen: NEGATIVE

## 2021-12-15 LAB — RPR: RPR Ser Ql: NONREACTIVE

## 2021-12-15 MED ORDER — ONDANSETRON HCL 4 MG/2ML IJ SOLN
4.0000 mg | Freq: Four times a day (QID) | INTRAMUSCULAR | Status: DC | PRN
  Administered 2021-12-15: 4 mg via INTRAVENOUS
  Filled 2021-12-15: qty 2

## 2021-12-15 MED ORDER — TERBUTALINE SULFATE 1 MG/ML IJ SOLN: 0.2500 mg | Freq: Once | INTRAMUSCULAR | Status: DC | PRN

## 2021-12-15 MED ORDER — LACTATED RINGERS IV SOLN: Freq: Once | INTRAVENOUS | Status: AC

## 2021-12-15 MED ORDER — FENTANYL CITRATE (PF) 100 MCG/2ML IJ SOLN
50.0000 ug | INTRAMUSCULAR | Status: DC | PRN
  Administered 2021-12-15: 50 ug via INTRAVENOUS

## 2021-12-15 MED ORDER — SODIUM CHLORIDE 0.9 % IV SOLN
5.0000 10*6.[IU] | Freq: Once | INTRAVENOUS | Status: AC
  Administered 2021-12-15: 5 10*6.[IU] via INTRAVENOUS
  Filled 2021-12-15: qty 5

## 2021-12-15 MED ORDER — LIDOCAINE HCL (PF) 1 % IJ SOLN
30.0000 mL | INTRAMUSCULAR | Status: AC | PRN
Start: 2021-12-15 — End: 2021-12-15
  Administered 2021-12-15: 30 mL via SUBCUTANEOUS
  Filled 2021-12-15: qty 30

## 2021-12-15 MED ORDER — PENICILLIN G POT IN DEXTROSE 60000 UNIT/ML IV SOLN
3.0000 10*6.[IU] | INTRAVENOUS | Status: DC
  Filled 2021-12-15: qty 50

## 2021-12-15 MED ORDER — METOCLOPRAMIDE HCL 5 MG/ML IJ SOLN
10.0000 mg | Freq: Once | INTRAMUSCULAR | Status: AC
  Administered 2021-12-15: 10 mg via INTRAVENOUS
  Filled 2021-12-15: qty 2

## 2021-12-15 MED ORDER — SOD CITRATE-CITRIC ACID 500-334 MG/5ML PO SOLN: 30.0000 mL | ORAL | Status: DC | PRN

## 2021-12-15 MED ORDER — DEXAMETHASONE SODIUM PHOSPHATE 10 MG/ML IJ SOLN
10.0000 mg | Freq: Once | INTRAMUSCULAR | Status: AC
  Administered 2021-12-15: 10 mg via INTRAVENOUS
  Filled 2021-12-15: qty 1

## 2021-12-15 MED ORDER — ACETAMINOPHEN 325 MG PO TABS: 650.0000 mg | ORAL_TABLET | ORAL | Status: DC | PRN

## 2021-12-15 MED ORDER — LACTATED RINGERS IV SOLN: INTRAVENOUS | Status: DC

## 2021-12-15 MED ORDER — MISOPROSTOL 50MCG HALF TABLET
50.0000 ug | ORAL_TABLET | ORAL | Status: DC | PRN
  Administered 2021-12-15 (×2): 50 ug via ORAL
  Filled 2021-12-15 (×2): qty 1

## 2021-12-15 MED ORDER — LACTATED RINGERS IV SOLN: 500.0000 mL | INTRAVENOUS | Status: DC | PRN

## 2021-12-15 MED ORDER — OXYTOCIN BOLUS FROM INFUSION
333.0000 mL | Freq: Once | INTRAVENOUS | Status: AC
  Administered 2021-12-15: 333 mL via INTRAVENOUS

## 2021-12-15 MED ORDER — OXYTOCIN-SODIUM CHLORIDE 30-0.9 UT/500ML-% IV SOLN
2.5000 [IU]/h | INTRAVENOUS | Status: DC
  Administered 2021-12-15: 2.5 [IU]/h via INTRAVENOUS
  Filled 2021-12-15: qty 500

## 2021-12-15 MED ORDER — FENTANYL CITRATE (PF) 100 MCG/2ML IJ SOLN
100.0000 ug | INTRAMUSCULAR | Status: DC | PRN
  Filled 2021-12-15: qty 2

## 2021-12-15 MED ORDER — DIPHENHYDRAMINE HCL 50 MG/ML IJ SOLN
25.0000 mg | Freq: Once | INTRAMUSCULAR | Status: AC
  Administered 2021-12-15: 25 mg via INTRAVENOUS
  Filled 2021-12-15: qty 1

## 2021-12-15 NOTE — MAU Provider Note (Addendum)
?History  ?  ? ?CSN: [id number] ? ?Arrival date and time: 12/15/21 1021 ? ? Event Date/Time  ? First Provider Initiated Contact with Patient 12/15/21 1102   ?  ? ?Chief Complaint  ?Patient presents with  ? Hypertension  ? Headache  ? ?Alexandria Fisher is a 31 yo female who presents to MAU from [facility] for evaluation of new onset mildly elevated blood pressure and persistent headache 2/2 Chronic Hypertension (not on medication). On arrival to MAU patient reports anterior headache pain 6/10. She denies aggravating or alleviating factors. She has not taken medication for this complaint. She denies visual disturbances, RUQ/epigastric pain, new onset swelling or weight gain.   ? ?She reports good fetal movement, no LOF, no bleeding, and no contractions. She states she had white discharge yesterday. ? ?Hypertension ?Associated symptoms include headaches. Pertinent negatives include no shortness of breath.  ?Headache  ?This is a new problem. The current episode started today. The problem occurs constantly. The pain is located in the Right unilateral region. The pain is at a severity of 6/10. Pertinent negatives include no coughing, nausea or vomiting. Her past medical history is significant for hypertension.  ? ?OB History   ? ? Gravida  ?7  ? Para  ?3  ? Term  ?3  ? Preterm  ?0  ? AB  ?3  ? Living  ?3  ?  ? ? SAB  ?2  ? IAB  ?1  ? Ectopic  ?0  ? Multiple  ?0  ? Live Births  ?3  ?   ?  ?  ? ? ?Past Medical History:  ?Diagnosis Date  ? Chlamydia   ? Chronic hypertension   ? Chronic tonsillitis 09/12/2018  ? Depression   ? 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 Hx MRSA infection 07/14/2011  ? Hypertension 10/24/2013  ? Infection   ? ? ?Past Surgical History:  ?Procedure Laterality Date  ? DILATION AND EVACUATION N/A 09/19/2013  ? Procedure: DILATATION AND EVACUATION;  Surgeon: Guss Bunde, MD;  Location: [facility];  Service: Gynecology;  Laterality: N/A;  ? TONSILLECTOMY N/A 09/12/2018  ? Procedure: TONSILLECTOMY;  Surgeon: Helayne Seminole, MD;  Location: [facility];  Service: ENT;  Laterality: N/A;  ? ? ?Family History  ?Problem Relation Age of Onset  ? Anemia Mother   ? Hypertension Mother   ? Stroke Mother   ? Hypertension Father   ? Breast cancer Maternal Grandmother   ? Diabetes Maternal Grandmother   ? Hypertension Maternal Grandmother   ? ? ?Social History  ? ?Tobacco Use  ? Smoking status: Former  ?  Packs/day: 0.10  ?  Types: Cigarettes  ?  Quit date: 11/05/2015  ?  Years since quitting: 6.1  ? Smokeless tobacco: Former  ?Vaping Use  ? Vaping Use: Never used  ?Substance Use Topics  ? Alcohol use: Not Currently  ?  Comment: occasionally   ? Drug use: No  ? ? ?Allergies: No Known Allergies ? ?Medications Prior to Admission  ?Medication Sig Dispense Refill Last Dose  ? aspirin EC 81 MG tablet Take 1 tablet (81 mg total) by mouth daily. Swallow whole. 30 tablet 11 12/14/2021  ? ferrous sulfate (FERROUSUL) 325 (65 FE) MG tablet Take 1 tablet (325 mg total) by mouth every other day. 30 tablet 0 Past Week  ? Prenatal 27-1 MG TABS Take 1 tablet by mouth daily.   12/14/2021  ? ? ?Review of Systems  ?Eyes:  Negative for  visual disturbance.  ?Respiratory:  Negative for cough and shortness of breath.   ?Cardiovascular:  Positive for leg swelling.  ?Gastrointestinal:  Negative for nausea and vomiting.  ?Genitourinary:  Positive for vaginal discharge. Negative for vaginal bleeding and vaginal pain.  ?Neurological:  Positive for headaches.  ?Physical Exam  ? ?Blood pressure 92/73, pulse 99, temperature 98.6 ?F (37 ?C), temperature source Oral, resp. rate 18, last menstrual period 03/26/2021, SpO2 97 %. ? ?Physical Exam ?Eyes:  ?   Pupils: Pupils are equal, round, and reactive to light.  ?Cardiovascular:  ?   Rate and Rhythm: Normal rate and regular rhythm.  ?Pulmonary:  ?   Effort: Pulmonary effort is normal.  ?   Breath sounds: Normal breath sounds.  ?Abdominal:  ?   Tenderness: There is no abdominal tenderness.  ?Musculoskeletal:     ?    General: Swelling present.  ?Neurological:  ?   Mental Status: She is alert.  ? ? ?MAU Course  ?Procedures ? ?MDM ?--Order CBC, CMP, UA and treatment of headache to check for severe features of pre-eclampsia. ?--Category 1 tracing: baseline 140, positive 15 by 15 accels, no decels. ?--TOCO: uterine irritability ? ?Reassessment (12:46 PM)  ?Her headache has resolved, however lab work shows elevated creatinine. ?Patient Vitals for the past 24 hrs: ? BP Temp Temp src Pulse Resp SpO2 Height Weight  ?12/15/21 1344 132/71 98.7 ?F (37.1 ?C) Oral (!) 110 16 -- '5\' 6"'$  (1.676 m) 272 lb 11.3 oz (123.7 kg)  ?12/15/21 1255 109/71 -- -- 99 -- -- -- --  ?12/15/21 1230 92/73 -- -- 99 -- 97 % -- --  ?12/15/21 1145 128/75 -- -- (!) 101 -- 99 % -- --  ?12/15/21 1130 128/79 -- -- (!) 102 -- 100 % -- --  ?12/15/21 1100 134/76 -- -- (!) 107 -- 99 % -- --  ?12/15/21 1053 129/79 -- -- (!) 114 -- -- -- --  ?12/15/21 1035 (!) 141/81 98.6 ?F (37 ?C) Oral (!) 107 18 99 % -- --  ?  ? ? ?Assessment and Plan  ?[name] is a 31 y.o. [phone] at [redacted]w[redacted]d ?Chronic hypertension with superimposed pre-eclampsia without SF ?Reactive tracing ?GBS positive ?Vertex by BSUS ?Discussed with Dr. WSi Raider report given to L&D team ?Admit to labor and delivery. ? ? ?SDarlina Rumpf CNM ?12/15/2021, 2:29 PM  ?

## 2021-12-15 NOTE — MAU Note (Signed)
Alexandria Fisher is a 31 y.o. at 20w4dhere in MAU reporting: sent over from the office for BP eval. States she had elevated BP and a headache. No visual changes, no RUQ pain. No bleeding or LOF. +FM ? ?Onset of complaint: today ? ?Pain score: 6/10 ? ?Vitals:  ? 12/15/21 1035  ?BP: (!) 141/81  ?Pulse: (!) 107  ?Resp: 18  ?Temp: 98.6 ?F (37 ?C)  ?SpO2: 99%  ?   ?FHT:149 ? ?Lab orders placed from triage: UA ? ?

## 2021-12-15 NOTE — Discharge Summary (Signed)
? ?  Postpartum Discharge Summary ? ?Date of Service updated ? ?   ?Patient Name: Alexandria Fisher ?DOB: August 29, 1991 ?MRN: [id number] ? ?Date of admission: 12/15/2021 ?Delivery date:12/15/2021  ?Delivering provider: Genia Del  ?Date of discharge: 12/17/2021 ? ?Admitting diagnosis: Chronic hypertension affecting pregnancy [O10.919] ?Intrauterine pregnancy: [redacted]w[redacted]d    ?Secondary diagnosis:  Principal Problem: ?  Vaginal delivery ?Active Problems: ?  Hemoglobin A-S genotype ([place] ?  GBS (group B Streptococcus carrier), +RV culture, currently pregnant ?  Supervision of high risk pregnancy, antepartum ?  BMI 40.0-44.9, adult ([place] ?  Chronic hypertension with superimposed preeclampsia ?  Iron deficiency anemia during pregnancy ? ?Additional problems: none    ?Discharge diagnosis: Term Pregnancy Delivered                                              ?Post partum procedures: ?Augmentation: Cytotec and IP Foley ?Complications: None ? ?Hospital course: Induction of Labor With Vaginal Delivery   ?31y.o. yo [id number] 319w4das admitted to the hospital 12/15/2021 for induction of labor.  Indication for induction: Chronic HTN with superimposed pre-eclampsia.  Patient had an uncomplicated labor course and progressed to complete after Cytotec, foley balloon placement, and SROM. She had an uncomplicated vaginal delivery. ?Membrane Rupture Time/Date: 9:40 PM ,12/15/2021   ?Delivery Method:Vaginal, Spontaneous  ?Episiotomy: None  ?Lacerations:  1st degree;Perineal  ?Details of delivery can be found in separate delivery note.  Patient had a routine postpartum course.  Due to chronic HTN with superimposed preeclampsia, she was started on Lasix 20 mg daily on PPD#1, which she will continue for a 5 day course.  Her blood pressures remained stable to low, current BP 118/75.  She is eating, drinking, ambulating, and voiding without issue.  Patient is discharged home 12/17/21. ? ?Newborn Data: ?Birth date:12/15/2021  ?Birth time:10:17 PM   ?Gender:Female  ?Living status:Living  ?Apgars:9 ,9  ?Weight:3240 g  ? ?Magnesium Sulfate received: No  ?BMZ received: No ?Rhophylac: N/A ?MMR: N/A ?T-DaP: Given prenatally ?Flu: No ?Transfusion: No ? ?Physical exam  ?Vitals:  ? 12/16/21 1000 12/16/21 1440 12/17/21 0617 12/17/21 1407  ?BP: 116/69 123/84 103/64 121/67  ?Pulse: 89 79 81 100  ?Resp: _0 ?Temp: 97.7 ?F (36.5 ?C) 98.4 ?F (36.9 ?C) 97.7 ?F (36.5 ?C) 97.8 ?F (36.6 ?C)  ?TempSrc: Oral Oral Oral Oral  ?SpO2: 100% 100% 98% 100%  ?Weight:      ?Height:      ? ?General: alert, cooperative, and no distress ?Lochia: appropriate ?Uterine Fundus: firm ?Incision: N/A ?DVT Evaluation: No evidence of DVT seen on physical exam. ? ?Labs: ?Lab Results  ?Component Value Date  ? WBC 9.3 12/15/2021  ? HGB 9.7 (L) 12/15/2021  ? HCT 30.2 (L) 12/15/2021  ? MCV 79.9 (L) 12/15/2021  ? PLT 194 12/15/2021  ? ?CMP Latest Ref Rng & Units 12/15/2021  ?Glucose 70 - 99 mg/dL 105(H)  ?BUN 6 - 20 mg/dL <5(L)  ?Creatinine 0.44 - 1.00 mg/dL 0.66  ?Sodium 135 - 145 mmol/L 138  ?Potassium 3.5 - 5.1 mmol/L 3.6  ?Chloride 98 - 111 mmol/L 104  ?CO2 22 - 32 mmol/L 24  ?Calcium 8.9 - 10.3 mg/dL 8.9  ?Total Protein 6.5 - 8.1 g/dL 6.4(L)  ?Total Bilirubin 0.3 - 1.2 mg/dL 0.4  ?Alkaline Phos 38 - 126 U/L 104  ?AST  15 - 41 U/L 15  ?ALT 0 - 44 U/L 9  ? ?Edinburgh Score: ?Edinburgh Postnatal Depression Scale Screening Tool 12/16/2021  ?I have been able to laugh and see the funny side of things. 0  ?I have looked forward with enjoyment to things. 0  ?I have blamed myself unnecessarily when things went wrong. 2  ?I have been anxious or worried for no good reason. 2  ?I have felt scared or panicky for no good reason. 1  ?Things have been getting on top of me. 1  ?I have been so unhappy that I have had difficulty sleeping. 0  ?I have felt sad or miserable. 1  ?I have been so unhappy that I have been crying. 1  ?The thought of harming myself has occurred to me. 0  ?Edinburgh Postnatal Depression Scale  Total 8  ? ? ? ?After visit meds:  ?Allergies as of 12/17/2021   ?No Known Allergies ?  ? ?  ?Medication List  ?  ? ?STOP taking these medications   ? ?aspirin EC 81 MG tablet ?  ?ferrous sulfate 325 (65 FE) MG tablet ?Commonly known as: FerrouSul ?  ? ?  ? ?TAKE these medications   ? ?acetaminophen 325 MG tablet ?Commonly known as: Tylenol ?Take 2 tablets (650 mg total) by mouth every 4 (four) hours as needed (for pain scale < 4). ?  ?furosemide 20 MG tablet ?Commonly known as: LASIX ?Take 1 tablet (20 mg total) by mouth daily. ?  ?ibuprofen 600 MG tablet ?Commonly known as: ADVIL ?Take 1 tablet (600 mg total) by mouth every 6 (six) hours. ?  ?Prenatal 27-1 MG Tabs ?Take 1 tablet by mouth daily. ?  ? ?  ? ? ? ?Discharge home in stable condition ?Infant Feeding: Bottle ?Infant Disposition: home with mother ?Discharge instruction: per After Visit Summary and Postpartum booklet. ?Activity: Advance as tolerated. Pelvic rest for 6 weeks.  ?Diet: routine diet ?Future Appointments: ?Future Appointments  ?Date Time Provider [place]  ?12/23/2021 10:00 AM [facility] NURSE [facility]  ?01/27/2022 10:15 AM Woodroe Mode, MD [facility]  ? ?Follow up Visit: ? Follow-up Information   ? ? Center for [facility] Follow up.   ?Specialty: Obstetrics and Gynecology ?Why: In one week as scheduled on 3/16 ?Contact information: ?[address] ?[place] [phone] ?[phone] ? ?  ?  ? ?  ?  ? ?  ? ?Message sent to [facility] by Dr. Gwenlyn Perking on 12/15/21.  ? ?Please schedule this patient for a In person postpartum visit in 6 weeks with the following provider: Any provider. ?Additional Postpartum F/U: BP check 1 week  ?High risk pregnancy complicated by:  Chronic HTN with superimposed pre-eclampsia ?Delivery mode:  Vaginal, Spontaneous  ?Anticipated Birth Control:  Unsure ? ?12/17/2021 ?Annalee Genta, DO ? ? ? ?

## 2021-12-15 NOTE — H&P (Addendum)
Alexandria Fisher is a 31 y.o. female [phone] with IUP at 65w4dby LMP presenting for IOL d/t cHTN (no home medication) with superimposed preeclampsia (P/C ratio 0.32). Initially had headache in clinic/MAU today but that has since resolved. She reports +FMs, No LOF, no VB, no blurry vision, headaches or peripheral edema, and RUQ pain.  She plans on bottle feeding. She is unsure for birth control currently. Previously wanted BTL --> now changed her mind. Considering LARC options but unsure. She received her prenatal care at  [facility]    Dating: By ultrasound --->  Estimated Date of Delivery: 12/25/21  Sono:   '@[redacted]w[redacted]d'$ , CWD, normal anatomy, cephalic presentation,  [id number] 69% EFW  Prenatal History/Complications:  --cHTN (not on any meds during pregnancy, was on HCTZ in 2017) --GBS + --Obesity (prepregnancy BMI 45) --Hemoglobin A-S genotype --Hx of Fibroids  Past Medical History: Past Medical History:  Diagnosis Date   Chlamydia    Chronic hypertension    Chronic tonsillitis 09/12/2018   Depression    Hemoglobin A-S genotype ([place] 01/04/2016   Hx MRSA infection 07/14/2011   Hypertension 10/24/2013   Infection     Past Surgical History: Past Surgical History:  Procedure Laterality Date   DILATION AND EVACUATION N/A 09/19/2013   Procedure: DILATATION AND EVACUATION;  Surgeon: KGuss Bunde MD;  [place] [facility];  Service: Gynecology;  Laterality: N/A;   TONSILLECTOMY N/A 09/12/2018   Procedure: TONSILLECTOMY;  Surgeon: MHelayne Seminole MD;  [place]  Service: ENT;  Laterality: N/A;    Obstetrical History: OB History     Gravida  7   Para  3   Term  3   Preterm  0   AB  3   Living  3      SAB  2   IAB  1   Ectopic  0   Multiple  0   Live Births  3           Social History Social History   Socioeconomic History   Marital status: Single    Spouse name: Not on file   Number of  children: 3   Years of education: Not on file   Highest education level: Not on file  Occupational History   Not on file  Tobacco Use   Smoking status: Former    Packs/day: 0.10    Types: Cigarettes    Quit date: 11/05/2015    Years since quitting: 6.1   Smokeless tobacco: Former  VScientific laboratory technicianUse: Never used  Substance and Sexual Activity   Alcohol use: Not Currently    Comment: occasionally    Drug use: No   Sexual activity: Yes    Birth control/protection: Condom  Other Topics Concern   Not on file  Social History Narrative   Not on file   Social Determinants of Health   Financial Resource Strain: Not on file  Food Insecurity: No Food Insecurity   Worried About RCharity fundraiserin the Last Year: Never true   RVandaliain the Last Year: Never true  Transportation Needs: No Transportation Needs   Lack of Transportation (Medical): No   Lack of Transportation (Non-Medical): No  Physical Activity: Not on file  Stress: Not on file  Social Connections: Not on file    Family History: Family History  Problem Relation Age of Onset   Anemia Mother    Hypertension  Mother    Stroke Mother    Hypertension Father    Breast cancer Maternal Grandmother    Diabetes Maternal Grandmother    Hypertension Maternal Grandmother     Allergies: No Known Allergies  Pt denies allergies to latex, iodine, or shellfish.  Medications Prior to Admission  Medication Sig Dispense Refill Last Dose   aspirin EC 81 MG tablet Take 1 tablet (81 mg total) by mouth daily. Swallow whole. 30 tablet 11 12/14/2021   ferrous sulfate (FERROUSUL) 325 (65 FE) MG tablet Take 1 tablet (325 mg total) by mouth every other day. 30 tablet 0 Past Week   Prenatal 27-1 MG TABS Take 1 tablet by mouth daily.   12/14/2021     Review of Systems   All systems reviewed and negative except as stated in HPI  Blood pressure 132/71, pulse (!) 110, temperature 98.7 F (37.1 C), temperature source Oral,  resp. rate 16, height '5\' 6"'$  (1.676 m), weight 272 lb 11.3 oz (123.7 kg), last menstrual period 03/26/2021, SpO2 97 %. General appearance: alert, cooperative, and no distress Lungs: no iWOB Heart: regular rate  Abdomen: soft, non-tender Extremities: no sign of DVT Presentation: cephalic by BSUS in MAU Fetal monitoringBaseline: 140s-150s bpm, Variability: Good {> 6 bpm), Accelerations: Reactive, and Decelerations: Absent Uterine activity: occasional Dilation: Fingertip Effacement (%): Thick Station: -3 Exam by:: Mary Martinique Johnson, RNC-OB   Prenatal labs: ABO, Rh: --/--/B POS (03/08 1140) Antibody: NEG (03/08 1140) Rubella: 3.45 (09/01 1525) RPR: NON REACTIVE (03/08 1142)  HBsAg: Negative (09/01 1525)  HIV: Non Reactive (12/22 0844)  GBS: Positive/-- (02/21 1600)  2 hr Glucola: 88 Genetic screening:  Panorama LR, Horizon Sickle Cell Disease Carrier Anatomy US: Normal Female  Prenatal Transfer Tool  Maternal Diabetes: No Genetic Screening: Normal, Sickle Cell Disease Carrier Maternal Ultrasounds/Referrals: Normal Fetal Ultrasounds or other Referrals:  None, Referred to Materal Fetal Medicine  for growth and BPP (growth was appropriate and last BPP 8/8 Maternal Substance Abuse:  No Significant Maternal Medications:  None Significant Maternal Lab Results: Group B Strep positive  Results for orders placed or performed during the hospital encounter of 12/15/21 (from the past 24 hour(s))  Protein / creatinine ratio, urine   Collection Time: 12/15/21 11:30 AM  Result Value Ref Range   Creatinine, Urine 65.19 mg/dL   Total Protein, Urine 21 mg/dL   Protein Creatinine Ratio 0.32 (H) 0.00 - 0.15 mg/mg[Cre]  Type and screen [place]   Collection Time: 12/15/21 11:40 AM  Result Value Ref Range   ABO/RH(D) B POS    Antibody Screen NEG    Sample Expiration      12/18/2021,2359 Performed at [facility], [address], [place], [place] [address]   CBC    Collection Time: 12/15/21 11:42 AM  Result Value Ref Range   WBC 9.3 4.0 - 10.5 K/uL   RBC 3.78 (L) 3.87 - 5.11 MIL/uL   Hemoglobin 9.7 (L) 12.0 - 15.0 g/dL   HCT 30.2 (L) 36.0 - 46.0 %   MCV 79.9 (L) 80.0 - 100.0 fL   MCH 25.7 (L) 26.0 - 34.0 pg   MCHC 32.1 30.0 - 36.0 g/dL   RDW 13.9 11.5 - 15.5 %   Platelets 194 150 - 400 K/uL   nRBC 0.0 0.0 - 0.2 %  Comprehensive metabolic panel   Collection Time: 12/15/21 11:42 AM  Result Value Ref Range   Sodium 138 135 - 145 mmol/L   Potassium 3.6 3.5 -  5.1 mmol/L   Chloride 104 98 - 111 mmol/L   CO2 24 22 - 32 mmol/L   Glucose, Bld 105 (H) 70 - 99 mg/dL   BUN <5 (L) 6 - 20 mg/dL   Creatinine, Ser 0.66 0.44 - 1.00 mg/dL   Calcium 8.9 8.9 - 10.3 mg/dL   Total Protein 6.4 (L) 6.5 - 8.1 g/dL   Albumin 2.5 (L) 3.5 - 5.0 g/dL   AST 15 15 - 41 U/L   ALT 9 0 - 44 U/L   Alkaline Phosphatase 104 38 - 126 U/L   Total Bilirubin 0.4 0.3 - 1.2 mg/dL   GFR, Estimated >60 >60 mL/min   Anion gap 10 5 - 15  RPR   Collection Time: 12/15/21 11:42 AM  Result Value Ref Range   RPR Ser Ql NON REACTIVE NON REACTIVE  Resp Panel by RT-PCR (Flu A&B, Covid) Nasopharyngeal Swab   Collection Time: 12/15/21 12:46 PM   Specimen: Nasopharyngeal Swab; Nasopharyngeal(NP) swabs in vial transport medium  Result Value Ref Range   SARS Coronavirus 2 by RT PCR NEGATIVE NEGATIVE   Influenza A by PCR NEGATIVE NEGATIVE   Influenza B by PCR NEGATIVE NEGATIVE    Patient Active Problem List   Diagnosis Date Noted   Chronic hypertension with superimposed preeclampsia 12/15/2021   Obesity affecting pregnancy in second trimester 08/09/2021   BMI 40.0-44.9, adult ([place]) 08/09/2021   Chronic hypertension affecting pregnancy 06/11/2021   Supervision of high risk pregnancy, antepartum 06/10/2021   GBS (group B Streptococcus carrier), +RV culture, currently pregnant 06/23/2016   Hemoglobin A-S genotype ([place]) 01/04/2016    Assessment/Plan:  Alexandria Fisher is a 31 y.o.  [id number] at 42w4dhere for  IOL d/t cHTN (no home medication) with superimposed preeclampsia (P/C ratio 0.32)  #Labor: IOL starting with cytotec. Will assess for foley balloon placement at next check. Once 4-5 cm and head well applied will plan for AROM, discussed with patient and patient amenable when appropriate. #Pain: Interested in nitrous oxide, not interested in epidural currently #FWB: Cat I #ID:  GBS + > PCN #MOF: Bottle #MOC: Unsure currently, mentioned and LARCs and condoms #Circ:  Yes  #cHTN with SIPE BP stable currently. Patient is asymptomatic. P:C 0.32 at admission. Initially had a headache in MAU but resolved with meds. -Serial BPs  MGerrit Heck PGY1 Center for [facility] 12/15/2021, 5:45 PM   GME ATTESTATION:  I saw and evaluated the patient. I agree with the findings and the plan of care as documented in the residents note and have made all necessary edits.  ARenard Matter MD, MPH OB Fellow, [place]3/05/2022 5:48 PM

## 2021-12-15 NOTE — Progress Notes (Signed)
Labor Progress Note [name] is a 31 y.o. [phone] at 74w4dpresented for IOL d/t cHTN (no home meds) with superimposed preeclampsia (p/c ratio 0.32) ?S: Patient is resting comfortably, has some pressure but not constant. ? ?O:  ?BP 132/71 (BP Location: Right Arm)   Pulse (!) 110   Temp 98.7 ?F (37.1 ?C) (Oral)   Resp 16   Ht '5\' 6"'$  (1.676 m)   Wt 123.7 kg   LMP 03/26/2021 (Approximate)   SpO2 97%   BMI 44.02 kg/m?  ?EFM: 150s FHR baseline/moderate varibility/+accels, no decels ? ?CVE: Dilation: 1 ?Effacement (%): Thick ?Cervical Position: Posterior ?Station: -3 ?Presentation: Vertex ?Exam by:: KDerrill Memo CNM ? ? ?A&P: 31y.o. [id number]?#Labor: Progressing well, dilated to 1 s/p 1 dose buccal cytotec. FB placed while in room. Will give additional buccal cytotec ?#Pain: Controlled currently; interested in nitrous oxide in future would like to avoid epidural currently ?#FWB: Cat I ?#GBS positive PCN ? ?#cHTN w SIPE ?BP has been stable. Pt Asymptomatic ?-Serial BP ? ?MaGerrit HeckMDPalmeror WoDean Foods CompanyCoSouth Connellsville6:32 PM  ?

## 2021-12-15 NOTE — Patient Instructions (Signed)

## 2021-12-15 NOTE — Progress Notes (Signed)
? ?  Subjective:  ?Alexandria Fisher is a 31 y.o. [phone] at 67w4dbeing seen today for ongoing prenatal care.  She is currently monitored for the following issues for this high-risk pregnancy and has Hemoglobin A-S genotype ([place]; GBS (group B Streptococcus carrier), +RV culture, currently pregnant; Supervision of high risk pregnancy, antepartum; Chronic hypertension affecting pregnancy; Obesity affecting pregnancy in second trimester; and BMI 40.0-44.9, adult ([place] on their problem list. ? ?Patient reports no complaints.  Contractions: Irritability. Vag. Bleeding: None.  Movement: Present. Denies leaking of fluid.  ? ?The following portions of the patient's history were reviewed and updated as appropriate: allergies, current medications, past family history, past medical history, past social history, past surgical history and problem list. Problem list updated. ? ?Objective:  ? ?Vitals:  ? 12/15/21 0947  ?BP: (!) 144/83  ?Pulse: 71  ?Weight: 272 lb 9.6 oz (123.7 kg)  ? ? ?Fetal Status: Fetal Heart Rate (bpm): 132   Movement: Present    ? ?General:  Alert, oriented and cooperative. Patient is in no acute distress.  ?Skin: Skin is warm and dry. No rash noted.   ?Cardiovascular: Normal heart rate noted  ?Respiratory: Normal respiratory effort, no problems with respiration noted  ?Abdomen: Soft, gravid, appropriate for gestational age. Pain/Pressure: Present     ?Pelvic: Vag. Bleeding: None     ?Cervical exam deferred        ?Extremities: Normal range of motion.     ?Mental Status: Normal mood and affect. Normal behavior. Normal judgment and thought content.  ? ?Urinalysis:     ? ?Assessment and Plan:  ?Pregnancy: [id number] 378w4d ?1. Supervision of high risk pregnancy, antepartum ?BP mildly elevated today, has IOL in 3 days ?FHR normal ? ?2. Chronic hypertension affecting pregnancy ?No meds, on ASA, mild range today for first time in this pregnancy ?Reports she has a severe headache that just came on this morning,  no other PreE symptoms at present ?IOL scheduled already in three days, will send to MAU for labs and treatment of headache, MAU provider and first attending notified via [place] ?3. GBS (group B Streptococcus carrier), +RV culture, currently pregnant ?Tx in labor ? ?4. Obesity affecting pregnancy in second trimester ? ? ?Term labor symptoms and general obstetric precautions including but not limited to vaginal bleeding, contractions, leaking of fluid and fetal movement were reviewed in detail with the patient. ?Please refer to After Visit Summary for other counseling recommendations.  ?Return in 7 weeks (on 02/02/2022) for PP check. ? ? ?EcClarnce FlockMD ? ?

## 2021-12-16 ENCOUNTER — Ambulatory Visit: Payer: Medicaid Other

## 2021-12-16 ENCOUNTER — Encounter (HOSPITAL_COMMUNITY): Payer: Self-pay | Admitting: Family Medicine

## 2021-12-16 MED ORDER — SENNOSIDES-DOCUSATE SODIUM 8.6-50 MG PO TABS
2.0000 | ORAL_TABLET | Freq: Every day | ORAL | Status: DC
  Administered 2021-12-16 – 2021-12-17 (×2): 2 via ORAL
  Filled 2021-12-16 (×2): qty 2

## 2021-12-16 MED ORDER — IBUPROFEN 600 MG PO TABS
600.0000 mg | ORAL_TABLET | Freq: Four times a day (QID) | ORAL | Status: DC
  Administered 2021-12-16 – 2021-12-17 (×6): 600 mg via ORAL
  Filled 2021-12-16 (×6): qty 1

## 2021-12-16 MED ORDER — WITCH HAZEL-GLYCERIN EX PADS: 1.0000 "application " | MEDICATED_PAD | CUTANEOUS | Status: DC | PRN

## 2021-12-16 MED ORDER — PRENATAL MULTIVITAMIN CH
1.0000 | ORAL_TABLET | Freq: Every day | ORAL | Status: DC
  Administered 2021-12-16 – 2021-12-17 (×2): 1 via ORAL
  Filled 2021-12-16 (×2): qty 1

## 2021-12-16 MED ORDER — DIPHENHYDRAMINE HCL 25 MG PO CAPS: 25.0000 mg | ORAL_CAPSULE | Freq: Four times a day (QID) | ORAL | Status: DC | PRN

## 2021-12-16 MED ORDER — OXYCODONE HCL 5 MG PO TABS
5.0000 mg | ORAL_TABLET | ORAL | Status: DC | PRN
  Administered 2021-12-16 – 2021-12-17 (×5): 5 mg via ORAL
  Filled 2021-12-16 (×5): qty 1

## 2021-12-16 MED ORDER — ACETAMINOPHEN 325 MG PO TABS
650.0000 mg | ORAL_TABLET | ORAL | Status: DC | PRN
  Administered 2021-12-16 – 2021-12-17 (×5): 650 mg via ORAL
  Filled 2021-12-16 (×5): qty 2

## 2021-12-16 MED ORDER — ONDANSETRON HCL 4 MG/2ML IJ SOLN: 4.0000 mg | INTRAMUSCULAR | Status: DC | PRN

## 2021-12-16 MED ORDER — ONDANSETRON HCL 4 MG PO TABS: 4.0000 mg | ORAL_TABLET | ORAL | Status: DC | PRN

## 2021-12-16 MED ORDER — BENZOCAINE-MENTHOL 20-0.5 % EX AERO
1.0000 "application " | INHALATION_SPRAY | CUTANEOUS | Status: DC | PRN
  Filled 2021-12-16: qty 56

## 2021-12-16 MED ORDER — SIMETHICONE 80 MG PO CHEW
80.0000 mg | CHEWABLE_TABLET | ORAL | Status: DC | PRN
  Filled 2021-12-16: qty 1

## 2021-12-16 MED ORDER — FUROSEMIDE 20 MG PO TABS
20.0000 mg | ORAL_TABLET | Freq: Every day | ORAL | Status: DC
  Administered 2021-12-16 – 2021-12-17 (×2): 20 mg via ORAL
  Filled 2021-12-16 (×2): qty 1

## 2021-12-16 MED ORDER — DIBUCAINE (PERIANAL) 1 % EX OINT: 1.0000 "application " | TOPICAL_OINTMENT | CUTANEOUS | Status: DC | PRN

## 2021-12-16 MED ORDER — FERROUS SULFATE 325 (65 FE) MG PO TABS
325.0000 mg | ORAL_TABLET | ORAL | Status: DC
Start: 2021-12-16 — End: 2021-12-18
  Administered 2021-12-16: 10:00:00 325 mg via ORAL
  Filled 2021-12-16: qty 1

## 2021-12-16 MED ORDER — COCONUT OIL OIL: 1.0000 "application " | TOPICAL_OIL | Status: DC | PRN

## 2021-12-16 NOTE — Progress Notes (Signed)
Post Partum Day 1 ?Subjective: ?Doing well. No acute events overnight. Pain is controlled and bleeding is appropriate. She is eating, drinking, voiding, and ambulating without issue. She is formula feeding which is going well. She has no other concerns at this time. ? ?Objective: ?Blood pressure 122/82, pulse 90, temperature 98.2 ?F (36.8 ?C), temperature source Axillary, resp. rate 16, height '5\' 6"'$  (1.676 m), weight 123.7 kg, last menstrual period 03/26/2021, SpO2 99 %, unknown if currently breastfeeding. ? ?Physical Exam:  ?General: alert, cooperative, and no distress ?Lochia: appropriate ?Uterine Fundus: firm and below umbilicus  ?DVT Evaluation: no LE edema or calf tenderness to palpation  ? ?Recent Labs  ?  12/15/21 ?1142  ?HGB 9.7*  ?HCT 30.2*  ? ? ?Assessment/Plan: ?Alexandria Fisher is a 31 y.o. [phone] on PPD# 1 s/p SVD. ? ?Progressing well. Meeting postpartum milestones. VSS. Continue routine postpartum care. ? ?#Chronic HTN with SIPE: ?- BP normal range since delivery ?- No symptoms  ?- Plan to start Lasix 20 mg daily today  ?- Will continue to monitor closely  ? ?#Iron deficiency anemia: ?- Hgb 9.7 on admission ?- EBL 250 cc at delivery ?- Asymptomatic  ?- Will start oral iron supplementation today  ? ?Feeding: Formula  ?Contraception: Undecided  ?Circumcision: Desires, consented at bedside this AM ? ?Dispo: Plan for discharge on PPD#2.  ? ? LOS: 1 day  ? ?Genia Del, MD  ?12/16/2021, 5:58 AM  ? ? ?

## 2021-12-16 NOTE — H&P (Signed)
MOB was referred for history of depression. ? ?* Referral screened out by Clinical Social Worker because none of the following criteria appear to apply: ?~ History of anxiety/depression during this pregnancy, or of post-partum depression following prior delivery. Per chart review, there were no concerns with depression noted during the pregnancy or PPD. MOB scored an 8 on the [place] evaluation.  ? ?~ Diagnosis of anxiety and/or depression within last 3 years, Per chart review, MOB initial diagnosis noted to be in 2012.  ?OR ?* MOB's symptoms currently being treated with medication and/or therapy. ? ?Please contact the Clinical Social Worker if needs arise, by [facility] request, or if MOB scores greater than 9/yes to question 10 on Edinburgh Postpartum Depression Screen.  ? ?Kathrin Greathouse, MSW, LCSW ?[facility] [place]  ?Clinical Social Worker  ?[phone] ?12/16/2021  8:29 AM  ?

## 2021-12-17 ENCOUNTER — Other Ambulatory Visit (HOSPITAL_COMMUNITY): Payer: Self-pay

## 2021-12-17 MED ORDER — FUROSEMIDE 20 MG PO TABS
20.0000 mg | ORAL_TABLET | Freq: Every day | ORAL | 0 refills | Status: DC
  Filled 2021-12-17: qty 4, 4d supply, fill #0

## 2021-12-17 MED ORDER — ACETAMINOPHEN 325 MG PO TABS
650.0000 mg | ORAL_TABLET | ORAL | Status: DC | PRN
Start: 2021-12-17 — End: 2021-12-27

## 2021-12-17 MED ORDER — OXYCODONE HCL 5 MG PO TABS: 5.0000 mg | ORAL_TABLET | Freq: Four times a day (QID) | ORAL | 0 refills | Status: DC | PRN

## 2021-12-17 MED ORDER — IBUPROFEN 600 MG PO TABS
600.0000 mg | ORAL_TABLET | Freq: Four times a day (QID) | ORAL | 0 refills | Status: DC
  Filled 2021-12-17: qty 30, 8d supply, fill #0

## 2021-12-17 NOTE — Progress Notes (Signed)
Received call from patient's RN on postpartum. Patient has been discharged and did not received any pills of Oxycodone with her discharge medications (which were delivered through meds to bed). ? ?Reviewed chart - did have Oxycodone prescribed in postpartum and received doses 2-3 times a day the last two days. Patient is a multip (G7P4) and needing some Oxycodone for pain management therefore reasonable to give few doses of Oxycodone for home. ? ?Sent 4 pills of Oxycodone to patient's outpatient pharmacy (confirmed with patient by RN) ? ?Renard Matter, MD, MPH ?OB Fellow, [facility] ? ?

## 2021-12-21 ENCOUNTER — Other Ambulatory Visit: Payer: Self-pay

## 2021-12-21 MED ORDER — OXYCODONE HCL 5 MG PO TABS: 5.0000 mg | ORAL_TABLET | Freq: Four times a day (QID) | ORAL | 0 refills | Status: DC | PRN

## 2021-12-21 NOTE — Telephone Encounter (Signed)
Pt sent message via MyChart that ibuprofen is not effective her pain that she has inside her vagina.  She delivered on 12/15/21 vaginal delivery with a 1st degree laceration.  Message routed to [place], MD.   ? ?Alla Sloma,RN  ?12/21/21 ?

## 2021-12-23 ENCOUNTER — Ambulatory Visit: Payer: Medicaid Other

## 2021-12-24 ENCOUNTER — Inpatient Hospital Stay (HOSPITAL_COMMUNITY)
Admission: AD | Admit: 2021-12-24 | Discharge: 2021-12-24 | Disposition: A | Discharge status: Home / Self Care | Payer: Medicaid Other | Attending: Obstetrics & Gynecology | Admitting: Obstetrics & Gynecology

## 2021-12-24 ENCOUNTER — Other Ambulatory Visit: Payer: Self-pay

## 2021-12-24 DIAGNOSIS — I10 Essential (primary) hypertension: Secondary | ICD-10-CM

## 2021-12-24 DIAGNOSIS — Z79899 Other long term (current) drug therapy: Secondary | ICD-10-CM | POA: Diagnosis not present

## 2021-12-24 DIAGNOSIS — O1003 Pre-existing essential hypertension complicating the puerperium: Secondary | ICD-10-CM | POA: Diagnosis present

## 2021-12-24 DIAGNOSIS — R519 Headache, unspecified: Secondary | ICD-10-CM | POA: Insufficient documentation

## 2021-12-24 DIAGNOSIS — E669 Obesity, unspecified: Secondary | ICD-10-CM | POA: Insufficient documentation

## 2021-12-24 LAB — COMPREHENSIVE METABOLIC PANEL
ALT: 17 U/L (ref 0–44)
AST: 19 U/L (ref 15–41)
Albumin: 2.9 g/dL — ABNORMAL LOW (ref 3.5–5.0)
Alkaline Phosphatase: 75 U/L (ref 38–126)
Anion gap: 8 (ref 5–15)
BUN: 8 mg/dL (ref 6–20)
CO2: 26 mmol/L (ref 22–32)
Calcium: 9.1 mg/dL (ref 8.9–10.3)
Chloride: 106 mmol/L (ref 98–111)
Creatinine, Ser: 0.87 mg/dL (ref 0.44–1.00)
GFR, Estimated: >60 mL/min (ref >60)
Glucose, Bld: 91 mg/dL (ref 70–99)
Potassium: 4.1 mmol/L (ref 3.5–5.1)
Sodium: 140 mmol/L (ref 135–145)
Total Bilirubin: 0.4 mg/dL (ref 0.3–1.2)
Total Protein: 6.3 g/dL — ABNORMAL LOW (ref 6.5–8.1)

## 2021-12-24 LAB — CBC
HCT: 32.7 % — ABNORMAL LOW (ref 36.0–46.0)
Hemoglobin: 10.4 g/dL — ABNORMAL LOW (ref 12.0–15.0)
MCH: 25.6 pg — ABNORMAL LOW (ref 26.0–34.0)
MCHC: 31.8 g/dL (ref 30.0–36.0)
MCV: 80.5 fL (ref 80.0–100.0)
Platelets: 228 10*3/uL (ref 150–400)
RBC: 4.06 MIL/uL (ref 3.87–5.11)
RDW: 14.5 % (ref 11.5–15.5)
WBC: 9.9 10*3/uL (ref 4.0–10.5)
nRBC: 0 % (ref 0.0–0.2)

## 2021-12-24 MED ORDER — LABETALOL HCL 5 MG/ML IV SOLN: 80.0000 mg | INTRAVENOUS | Status: DC | PRN

## 2021-12-24 MED ORDER — LABETALOL HCL 5 MG/ML IV SOLN
20.0000 mg | INTRAVENOUS | Status: DC | PRN
  Administered 2021-12-24: 20 mg via INTRAVENOUS
  Filled 2021-12-24: qty 4

## 2021-12-24 MED ORDER — LISINOPRIL 10 MG PO TABS
10.0000 mg | ORAL_TABLET | Freq: Every day | ORAL | Status: DC
  Administered 2021-12-24: 10 mg via ORAL
  Filled 2021-12-24: qty 1

## 2021-12-24 MED ORDER — LISINOPRIL 10 MG PO TABS: 10.0000 mg | ORAL_TABLET | Freq: Every day | ORAL | 0 refills | Status: DC

## 2021-12-24 MED ORDER — LABETALOL HCL 5 MG/ML IV SOLN: 40.0000 mg | INTRAVENOUS | Status: DC | PRN

## 2021-12-24 MED ORDER — HYDRALAZINE HCL 20 MG/ML IJ SOLN: 10.0000 mg | INTRAMUSCULAR | Status: DC | PRN

## 2021-12-24 MED ORDER — ACETAMINOPHEN 500 MG PO TABS
1000.0000 mg | ORAL_TABLET | Freq: Once | ORAL | Status: AC
Start: 2021-12-24 — End: 2021-12-24
  Administered 2021-12-24: 1000 mg via ORAL
  Filled 2021-12-24: qty 2

## 2021-12-24 NOTE — MAU Note (Addendum)
PT SAYS SHE DEL ON 12-15-2021 VAG  ?STARTED H/A TODAY -  ?WAS INDUCED FOR H/A AND BP HIGH ?PT CHECKED BP AT HOME - 159/105 ?NOW- HAS H/A- PAIN -5  NO MEDS ?NO BLURRED VISION ?NO EPIGASTRIC PAIN   ?ALSO A LITTLE BIT - FEELS LIKE HER THROAT IS THROB/TIGHT  ?BOTTLE FEEDING ?

## 2021-12-24 NOTE — MAU Provider Note (Signed)
?History  ?  ? ?CSN: [id number] ? ?Arrival date and time: 12/24/21 1823 ? ? Event Date/Time  ? First Provider Initiated Contact with Patient 12/24/21 1942   ?  ? ?Chief Complaint  ?Patient presents with  ? Headache  ? ?HPI ?TALEYA Fisher is a 31 y.o. [id number] postpartum patient. She presents to MAU for evaluation of persistent headache 2/2 elevated blood pressures on her home cuff. Pain is across the top of her head. Pain score 5/10. She has not taken medication for this complaint. She denies visual disturbances, RUQ/epigastric pain, new onset swelling or weight gain. ? ?She is s/p vaginal delivery on 12/15/2021. Her pregnancy was complicated by [facility] with SIP. Her inpatient postpartum blood pressures were normotensive and she was not discharged home on blood pressure medicine. ? ? ?OB History   ? ? Gravida  ?7  ? Para  ?4  ? Term  ?4  ? Preterm  ?0  ? AB  ?3  ? Living  ?4  ?  ? ? SAB  ?2  ? IAB  ?1  ? Ectopic  ?0  ? Multiple  ?0  ? Live Births  ?4  ?   ?  ?  ? ? ?Past Medical History:  ?Diagnosis Date  ? Chlamydia   ? Chronic hypertension   ? Chronic tonsillitis 09/12/2018  ? Depression   ? 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 Hx MRSA infection 07/14/2011  ? Hypertension 10/24/2013  ? Infection   ? ? ?Past Surgical History:  ?Procedure Laterality Date  ? DILATION AND EVACUATION N/A 09/19/2013  ? Procedure: DILATATION AND EVACUATION;  Surgeon: Guss Bunde, MD;  Location: [facility];  Service: Gynecology;  Laterality: N/A;  ? TONSILLECTOMY N/A 09/12/2018  ? Procedure: TONSILLECTOMY;  Surgeon: Helayne Seminole, MD;  Location: [place];  Service: ENT;  Laterality: N/A;  ? ? ?Family History  ?Problem Relation Age of Onset  ? Anemia Mother   ? Hypertension Mother   ? Stroke Mother   ? Hypertension Father   ? Breast cancer Maternal Grandmother   ? Diabetes Maternal Grandmother   ? Hypertension Maternal Grandmother   ? ? ?Social History  ? ?Tobacco Use  ? Smoking status: Former  ?  Packs/day: 0.10   ?  Types: Cigarettes  ?  Quit date: 11/05/2015  ?  Years since quitting: 6.1  ? Smokeless tobacco: Former  ?Vaping Use  ? Vaping Use: Never used  ?Substance Use Topics  ? Alcohol use: Not Currently  ?  Comment: occasionally   ? Drug use: No  ? ? ?Allergies: No Known Allergies ? ?Medications Prior to Admission  ?Medication Sig Dispense Refill Last Dose  ? furosemide (LASIX) 20 MG tablet Take 1 tablet (20 mg total) by mouth daily. 4 tablet 0 Past Week  ? acetaminophen (TYLENOL) 325 MG tablet Take 2 tablets (650 mg total) by mouth every 4 (four) hours as needed (for pain scale < 4).   Unknown  ? ibuprofen (ADVIL) 600 MG tablet Take 1 tablet (600 mg total) by mouth every 6 (six) hours. 30 tablet 0 Unknown  ? oxyCODONE (OXY IR/ROXICODONE) 5 MG immediate release tablet Take 1 tablet (5 mg total) by mouth every 6 (six) hours as needed for severe pain (pain scale >7). 5 tablet 0 Unknown  ? Prenatal 27-1 MG TABS Take 1 tablet by mouth daily.     ? ? ?Review of Systems  ?Constitutional:  Positive for fatigue.  ?Neurological:  Positive for headaches.  ?All other systems reviewed and are negative. ?Physical Exam  ? ?Blood pressure (!) 152/94, pulse 76, temperature 98.7 ?F (37.1 ?C), temperature source Oral, resp. rate 20, height '5\' 6"'$  (1.676 m), weight 117.5 kg, SpO2 100 %, unknown if currently breastfeeding. ? ?Physical Exam ?Vitals and nursing note reviewed.  ?Constitutional:   ?   Appearance: She is well-developed. She is obese.  ?Cardiovascular:  ?   Rate and Rhythm: Normal rate and regular rhythm.  ?   Heart sounds: Normal heart sounds.  ?Pulmonary:  ?   Effort: Pulmonary effort is normal.  ?   Breath sounds: Normal breath sounds.  ?Abdominal:  ?   Palpations: Abdomen is soft.  ?Skin: ?   Capillary Refill: Capillary refill takes less than 2 seconds.  ?Neurological:  ?   Mental Status: She is alert and oriented to person, place, and time.  ?Psychiatric:     ?   Mood and Affect: Mood normal.     ?   Speech: Speech normal.      ?   Behavior: Behavior normal.  ? ? ?MAU Course  ?Procedures ? ?MDM ? ?--Severe range pressure in MAU 2/2 hx of CHTN with Superimposed Preeclampsia. Discussed with Dr. Elonda Husky. Patient meets criteria for initiating medication, Magnesium Sulfate not indicated at this time. ? ?--2105: Headache now 1/10. Update provided to Dr. Elonda Husky. Additional severe range blood pressures in the setting of Chronic Hypertension. Per Dr. Elonda Husky, will give first Lisinopril in MAU and one dose of IV Labetalol prior to discharge from MAU ? ? ?Orders Placed This Encounter  ?Procedures  ? CBC  ? Comprehensive metabolic panel  ? Measure blood pressure  ? ?Patient Vitals for the past 24 hrs: ? BP Temp Temp src Pulse Resp SpO2 Height Weight  ?12/24/21 2125 138/79 -- -- 72 -- -- -- --  ?12/24/21 2121 -- 98.7 ?F (37.1 ?C) Oral 63 -- -- -- --  ?12/24/21 2100 (!) 162/86 -- -- 63 -- -- -- --  ?12/24/21 2045 (!) 168/96 -- -- 62 -- -- -- --  ?12/24/21 2030 (!) 150/90 -- -- 66 -- -- -- --  ?12/24/21 2015 (!) 157/92 -- -- 67 -- -- -- --  ?12/24/21 2000 (!) 161/99 -- -- 68 -- -- -- --  ?12/24/21 1939 (!) 152/94 98.7 ?F (37.1 ?C) Oral 76 20 100 % '5\' 6"'$  (1.676 m) 117.5 kg  ? ?Results for orders placed or performed during the hospital encounter of 12/24/21 (from the past 24 hour(s))  ?CBC     Status: Abnormal  ? Collection Time: 12/24/21  7:17 PM  ?Result Value Ref Range  ? WBC 9.9 4.0 - 10.5 K/uL  ? RBC 4.06 3.87 - 5.11 MIL/uL  ? Hemoglobin 10.4 (L) 12.0 - 15.0 g/dL  ? HCT 32.7 (L) 36.0 - 46.0 %  ? MCV 80.5 80.0 - 100.0 fL  ? MCH 25.6 (L) 26.0 - 34.0 pg  ? MCHC 31.8 30.0 - 36.0 g/dL  ? RDW 14.5 11.5 - 15.5 %  ? Platelets 228 150 - 400 K/uL  ? nRBC 0.0 0.0 - 0.2 %  ?Comprehensive metabolic panel     Status: Abnormal  ? Collection Time: 12/24/21  7:17 PM  ?Result Value Ref Range  ? Sodium 140 135 - 145 mmol/L  ? Potassium 4.1 3.5 - 5.1 mmol/L  ? Chloride 106 98 - 111 mmol/L  ? CO2 26 22 - 32 mmol/L  ? Glucose, Bld 91 70 - 99  mg/dL  ? BUN 8 6 - 20 mg/dL  ?  Creatinine, Ser 0.87 0.44 - 1.00 mg/dL  ? Calcium 9.1 8.9 - 10.3 mg/dL  ? Total Protein 6.3 (L) 6.5 - 8.1 g/dL  ? Albumin 2.9 (L) 3.5 - 5.0 g/dL  ? AST 19 15 - 41 U/L  ? ALT 17 0 - 44 U/L  ? Alkaline Phosphatase 75 38 - 126 U/L  ? Total Bilirubin 0.4 0.3 - 1.2 mg/dL  ? GFR, Estimated >60 >60 mL/min  ? Anion gap 8 5 - 15  ? ?Assessment and Plan  ?--31 y.o. [id number]  ?--Chronic HTN in the postpartum period ?--Begin daily Lisinopril 10 mg ?--Headache now 1/10 ?--S/p consult with Dr. Elonda Husky ?--Discharge home in stable condition ? ?F/U: ?Patient has blood pressure check in office Monday 12/27/2021 ? ?Darlina Rumpf, MSA, MSN, CNM ?12/24/2021, 9:59 PM  ?

## 2021-12-25 ENCOUNTER — Encounter (HOSPITAL_COMMUNITY): Payer: Self-pay | Admitting: Obstetrics and Gynecology

## 2021-12-25 ENCOUNTER — Inpatient Hospital Stay (HOSPITAL_COMMUNITY)
Admission: AD | Admit: 2021-12-25 | Discharge: 2021-12-25 | Disposition: A | Discharge status: Home / Self Care | Payer: Medicaid Other | Attending: Family Medicine | Admitting: Family Medicine

## 2021-12-25 ENCOUNTER — Telehealth (HOSPITAL_COMMUNITY): Payer: Self-pay

## 2021-12-25 DIAGNOSIS — O10919 Unspecified pre-existing hypertension complicating pregnancy, unspecified trimester: Secondary | ICD-10-CM | POA: Insufficient documentation

## 2021-12-25 DIAGNOSIS — R519 Headache, unspecified: Secondary | ICD-10-CM | POA: Diagnosis not present

## 2021-12-25 DIAGNOSIS — I10 Essential (primary) hypertension: Secondary | ICD-10-CM | POA: Diagnosis not present

## 2021-12-25 LAB — COMPREHENSIVE METABOLIC PANEL
ALT: 15 U/L (ref 0–44)
AST: 15 U/L (ref 15–41)
Albumin: 2.8 g/dL — ABNORMAL LOW (ref 3.5–5.0)
Alkaline Phosphatase: 69 U/L (ref 38–126)
Anion gap: 8 (ref 5–15)
BUN: 7 mg/dL (ref 6–20)
CO2: 25 mmol/L (ref 22–32)
Calcium: 9 mg/dL (ref 8.9–10.3)
Chloride: 107 mmol/L (ref 98–111)
Creatinine, Ser: 0.8 mg/dL (ref 0.44–1.00)
GFR, Estimated: >60 mL/min (ref >60)
Glucose, Bld: 93 mg/dL (ref 70–99)
Potassium: 4 mmol/L (ref 3.5–5.1)
Sodium: 140 mmol/L (ref 135–145)
Total Bilirubin: 0.4 mg/dL (ref 0.3–1.2)
Total Protein: 5.9 g/dL — ABNORMAL LOW (ref 6.5–8.1)

## 2021-12-25 LAB — CBC
HCT: 31.9 % — ABNORMAL LOW (ref 36.0–46.0)
Hemoglobin: 10.2 g/dL — ABNORMAL LOW (ref 12.0–15.0)
MCH: 25.6 pg — ABNORMAL LOW (ref 26.0–34.0)
MCHC: 32 g/dL (ref 30.0–36.0)
MCV: 80.2 fL (ref 80.0–100.0)
Platelets: 216 10*3/uL (ref 150–400)
RBC: 3.98 MIL/uL (ref 3.87–5.11)
RDW: 14.6 % (ref 11.5–15.5)
WBC: 7.8 10*3/uL (ref 4.0–10.5)
nRBC: 0 % (ref 0.0–0.2)

## 2021-12-25 MED ORDER — NIFEDIPINE 10 MG PO CAPS: 10.0000 mg | ORAL_CAPSULE | ORAL | Status: DC

## 2021-12-25 MED ORDER — LABETALOL HCL 5 MG/ML IV SOLN
40.0000 mg | INTRAVENOUS | Status: DC | PRN
  Filled 2021-12-25: qty 8

## 2021-12-25 MED ORDER — NIFEDIPINE 10 MG PO CAPS
10.0000 mg | ORAL_CAPSULE | Freq: Once | ORAL | Status: AC
  Administered 2021-12-25: 10 mg via ORAL
  Filled 2021-12-25: qty 1

## 2021-12-25 MED ORDER — HYDRALAZINE HCL 20 MG/ML IJ SOLN: 10.0000 mg | INTRAMUSCULAR | Status: DC | PRN

## 2021-12-25 MED ORDER — LISINOPRIL 10 MG PO TABS
10.0000 mg | ORAL_TABLET | Freq: Once | ORAL | Status: AC
  Administered 2021-12-25: 10 mg via ORAL
  Filled 2021-12-25: qty 1

## 2021-12-25 MED ORDER — LABETALOL HCL 5 MG/ML IV SOLN: 80.0000 mg | INTRAVENOUS | Status: DC | PRN

## 2021-12-25 MED ORDER — LABETALOL HCL 5 MG/ML IV SOLN
20.0000 mg | INTRAVENOUS | Status: DC | PRN
  Administered 2021-12-25: 20 mg via INTRAVENOUS
  Filled 2021-12-25: qty 4

## 2021-12-25 MED ORDER — ACETAMINOPHEN 500 MG PO TABS
1000.0000 mg | ORAL_TABLET | Freq: Once | ORAL | Status: AC
  Administered 2021-12-25: 1000 mg via ORAL
  Filled 2021-12-25: qty 2

## 2021-12-25 MED ORDER — NIFEDIPINE ER OSMOTIC RELEASE 30 MG PO TB24: 30.0000 mg | ORAL_TABLET | Freq: Every day | ORAL | 2 refills | Status: DC

## 2021-12-25 NOTE — MAU Note (Signed)
Alexandria Fisher is a 31 y.o. here in MAU reporting: SVD on 3/8. Has had a headache all morning and then she checked her BP and it was 174/106. Has not picked up Rx for lisinopril so she has not taken that yet, states pharmacy has not delivered it yet. No visual changes or RUQ pain. ? ?Onset of complaint: today ? ?Pain score: 5/10 ? ?Vitals:  ? 12/25/21 1530  ?BP: (!) 156/89  ?Pulse: 76  ?Resp: 18  ?Temp: 99 ?F (37.2 ?C)  ?SpO2: 97%  ?   ?Lab orders placed from triage: none ? ?

## 2021-12-25 NOTE — MAU Provider Note (Signed)
?History  ?  ? ?CSN: [id number] ? ?Arrival date and time: 12/25/21 1515 ? ?None   ? ?Chief Complaint  ?Patient presents with  ? Hypertension  ? Headache  ? ?HPI ?Alexandria Fisher is a 31 y.o. [id number] s/p SVD on 3/8. Patient presents to MAU for elevated blood pressures at home and a headache. This is her second visit in less than 24 hours with the same complaint. She was sent home with a prescription for Lisinopril, which she has not taken today as her pharmacy usually delivers prescriptions to her, but she forgot that they do not deliver on the weekends.  ? ?She reports a headache all day. She took some ibuprofen around 0800 am and was able to sleep for several hours, but when she woke up headache returned. She currently rates jer headache 5/10. She has not taken anything for headache since. She reports that her BP was 174/106 this afternoon. She denies vision changes, chest pain, or RUQ/epigastric pain.  ? ?Pregnancy was complicated by cHTN with SIP. BP's remained normotensive, she did not receive magnesium sulfate, nor was she discharge home on BP medications. She reports she was only given Lasix, which she completed.  ? ?OB History   ? ? Gravida  ?7  ? Para  ?4  ? Term  ?4  ? Preterm  ?0  ? AB  ?3  ? Living  ?4  ?  ? ? SAB  ?2  ? IAB  ?1  ? Ectopic  ?0  ? Multiple  ?0  ? Live Births  ?4  ?   ?  ?  ? ? ?Past Medical History:  ?Diagnosis Date  ? Chlamydia   ? Chronic hypertension   ? Chronic tonsillitis 09/12/2018  ? Depression   ? 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 Hx MRSA infection 07/14/2011  ? Hypertension 10/24/2013  ? Infection   ? ? ?Past Surgical History:  ?Procedure Laterality Date  ? DILATION AND EVACUATION N/A 09/19/2013  ? Procedure: DILATATION AND EVACUATION;  Surgeon: Guss Bunde, MD;  Location: [facility];  Service: Gynecology;  Laterality: N/A;  ? TONSILLECTOMY N/A 09/12/2018  ? Procedure: TONSILLECTOMY;  Surgeon: Helayne Seminole, MD;  Location: [facility];  Service: ENT;   Laterality: N/A;  ? ? ?Family History  ?Problem Relation Age of Onset  ? Anemia Mother   ? Hypertension Mother   ? Stroke Mother   ? Hypertension Father   ? Breast cancer Maternal Grandmother   ? Diabetes Maternal Grandmother   ? Hypertension Maternal Grandmother   ? ? ?Social History  ? ?Tobacco Use  ? Smoking status: Former  ?  Packs/day: 0.10  ?  Types: Cigarettes  ?  Quit date: 11/05/2015  ?  Years since quitting: 6.1  ? Smokeless tobacco: Former  ?Vaping Use  ? Vaping Use: Never used  ?Substance Use Topics  ? Alcohol use: Not Currently  ?  Comment: occasionally   ? Drug use: No  ? ? ?Allergies: No Known Allergies ? ?Medications Prior to Admission  ?Medication Sig Dispense Refill Last Dose  ? acetaminophen (TYLENOL) 325 MG tablet Take 2 tablets (650 mg total) by mouth every 4 (four) hours as needed (for pain scale < 4).     ? furosemide (LASIX) 20 MG tablet Take 1 tablet (20 mg total) by mouth daily. 4 tablet 0   ? ibuprofen (ADVIL) 600 MG tablet Take 1 tablet (600 mg total) by mouth every 6 (six)  hours. 30 tablet 0   ? lisinopril (ZESTRIL) 10 MG tablet Take 1 tablet (10 mg total) by mouth daily. 30 tablet 0   ? oxyCODONE (OXY IR/ROXICODONE) 5 MG immediate release tablet Take 1 tablet (5 mg total) by mouth every 6 (six) hours as needed for severe pain (pain scale >7). 5 tablet 0   ? Prenatal 27-1 MG TABS Take 1 tablet by mouth daily.     ? ?Review of Systems  ?Constitutional: Negative.   ?Respiratory: Negative.    ?Gastrointestinal: Negative.   ?Genitourinary: Negative.   ?Musculoskeletal: Negative.   ?Neurological:  Positive for headaches.  ? ?Physical Exam  ?Patient Vitals for the past 24 hrs: ? BP Temp Temp src Pulse Resp SpO2  ?12/25/21 1815 111/62 -- -- 90 -- 98 %  ?12/25/21 1801 (!) 100/50 -- -- 95 -- --  ?12/25/21 1746 (!) 96/50 -- -- 95 -- --  ?12/25/21 1731 (!) 143/91 -- -- 66 -- 99 %  ?12/25/21 1716 (!) 164/89 -- -- 66 -- --  ?12/25/21 1700 (!) 170/92 -- -- 64 -- --  ?12/25/21 1645 (!) 165/92 -- -- 63  -- --  ?12/25/21 1633 (!) 162/92 -- -- 65 -- --  ?12/25/21 1615 (!) 164/94 -- -- 65 -- 99 %  ?12/25/21 1600 (!) 157/89 -- -- 68 -- 99 %  ?12/25/21 1545 (!) 155/89 -- -- 68 -- --  ?12/25/21 1530 (!) 156/89 99 ?F (37.2 ?C) Oral 76 18 97 %  ? ?Physical Exam ?Vitals and nursing note reviewed.  ?Constitutional:   ?   General: She is not in acute distress. ?Eyes:  ?   Extraocular Movements: Extraocular movements intact.  ?   Pupils: Pupils are equal, round, and reactive to light.  ?Cardiovascular:  ?   Rate and Rhythm: Normal rate.  ?Pulmonary:  ?   Effort: Pulmonary effort is normal.  ?Musculoskeletal:     ?   General: Normal range of motion.  ?   Cervical back: Normal range of motion.  ?Skin: ?   General: Skin is warm and dry.  ?Neurological:  ?   General: No focal deficit present.  ?   Mental Status: She is alert and oriented to person, place, and time.  ?Psychiatric:     ?   Mood and Affect: Mood normal.     ?   Behavior: Behavior normal.     ?   Thought Content: Thought content normal.     ?   Judgment: Judgment normal.  ? ?MAU Course  ?Procedures ?CBC, CMP ?Serial BP's ?Tylenol '1000mg'$   ?Lisinopril '10mg'$  ?IV Labetalol ? ?MDM ?Patient's initial BP's were mildly elevated. CBC, CMP, Tylenol '1000mg'$ , and Lisinopril '10mg'$  were ordered. However she did have 2 severe range BP's so 1 dose of IV Labetalol was given. Labs unremarkable. Headache improved, now rating 2/10.  ? ?1704: Reviewed patient with Dr. Kennon Rounds. Will give dose of Procardia '10mg'$  here and if BP stable, will discharge patient home with Procardia '30mg'$ .  ? ?1746: Significant drop in BP, however patient remains asymptomatic. Subsequent BP's within normal range. Patient stable for discharge home ? ?Assessment and Plan  ?Chronic hypertension ? ?- Discharge home in stable condition ?- Rx for Procardia sent. D/C Lisinopril ?- Keep BP check appointment as scheduled on Monday 3/20 ?- Strict return precautions. Return to MAU sooner or as needed for worsening symptoms   ? ? ?Renee Harder, CNM ?12/25/2021, 6:45 PM  ?

## 2021-12-25 NOTE — Telephone Encounter (Signed)
"  I have a headache. It's been going on since last night. I took my BP early this morning and it was 145/85. I took some motrin but it didn't help my headache. It's still there. I have been sleeping off and on. I'm about to check my BP now. My BP is 176/106." Patient declines blurry vision, seeing floaters, sudden onset of swelling of hands and face, or right upper abdominal pain. RN told patient to come to MAU here at [facility] and have BP and headache evaluated by a provider. Patient states that she will come to MAU here shortly. Patient has no other questions or concerns about her healing. ? ?"He is doing great. Everything is going good with him. He sleeps in his crib." RN reviewed ABC's of safe sleep with patient. Patient declines any questions or concerns about baby. ? ?EPDS score is 3. ? ?Sharyn Lull Brentton Wardlow,RN3,MSN,RNC-MNN ?12/25/2021,1433 ?

## 2021-12-26 ENCOUNTER — Other Ambulatory Visit: Payer: Self-pay

## 2021-12-26 ENCOUNTER — Encounter (HOSPITAL_COMMUNITY): Payer: Self-pay | Admitting: Family Medicine

## 2021-12-26 ENCOUNTER — Inpatient Hospital Stay (HOSPITAL_COMMUNITY)
Admission: AD | Admit: 2021-12-26 | Discharge: 2021-12-27 | Disposition: A | Discharge status: Home / Self Care | Payer: Medicaid Other | Attending: Family Medicine | Admitting: Family Medicine

## 2021-12-26 DIAGNOSIS — I10 Essential (primary) hypertension: Secondary | ICD-10-CM

## 2021-12-26 DIAGNOSIS — R519 Headache, unspecified: Secondary | ICD-10-CM | POA: Insufficient documentation

## 2021-12-26 DIAGNOSIS — O1093 Unspecified pre-existing hypertension complicating the puerperium: Secondary | ICD-10-CM | POA: Diagnosis present

## 2021-12-26 LAB — COMPREHENSIVE METABOLIC PANEL
ALT: 14 U/L (ref 0–44)
AST: 17 U/L (ref 15–41)
Albumin: 2.7 g/dL — ABNORMAL LOW (ref 3.5–5.0)
Alkaline Phosphatase: 65 U/L (ref 38–126)
Anion gap: 9 (ref 5–15)
BUN: 7 mg/dL (ref 6–20)
CO2: 26 mmol/L (ref 22–32)
Calcium: 8.6 mg/dL — ABNORMAL LOW (ref 8.9–10.3)
Chloride: 105 mmol/L (ref 98–111)
Creatinine, Ser: 0.84 mg/dL (ref 0.44–1.00)
GFR, Estimated: >60 mL/min (ref >60)
Glucose, Bld: 98 mg/dL (ref 70–99)
Potassium: 3.8 mmol/L (ref 3.5–5.1)
Sodium: 140 mmol/L (ref 135–145)
Total Bilirubin: 0.5 mg/dL (ref 0.3–1.2)
Total Protein: 5.7 g/dL — ABNORMAL LOW (ref 6.5–8.1)

## 2021-12-26 LAB — CBC
HCT: 31.6 % — ABNORMAL LOW (ref 36.0–46.0)
Hemoglobin: 10.4 g/dL — ABNORMAL LOW (ref 12.0–15.0)
MCH: 26.3 pg (ref 26.0–34.0)
MCHC: 32.9 g/dL (ref 30.0–36.0)
MCV: 80 fL (ref 80.0–100.0)
Platelets: 205 10*3/uL (ref 150–400)
RBC: 3.95 MIL/uL (ref 3.87–5.11)
RDW: 14.4 % (ref 11.5–15.5)
WBC: 8.3 10*3/uL (ref 4.0–10.5)
nRBC: 0 % (ref 0.0–0.2)

## 2021-12-26 MED ORDER — NIFEDIPINE ER OSMOTIC RELEASE 30 MG PO TB24
30.0000 mg | ORAL_TABLET | Freq: Every day | ORAL | Status: DC
  Administered 2021-12-26: 30 mg via ORAL
  Filled 2021-12-26: qty 1

## 2021-12-26 MED ORDER — ACETAMINOPHEN 500 MG PO TABS
1000.0000 mg | ORAL_TABLET | Freq: Four times a day (QID) | ORAL | Status: DC | PRN
  Administered 2021-12-26: 1000 mg via ORAL
  Filled 2021-12-26: qty 2

## 2021-12-26 MED ORDER — NIFEDIPINE ER OSMOTIC RELEASE 30 MG PO TB24: 30.0000 mg | ORAL_TABLET | Freq: Two times a day (BID) | ORAL | 2 refills | Status: DC

## 2021-12-26 NOTE — MAU Provider Note (Signed)
Patient Alexandria Fisher is a 31 y.o. [id number] ? At 11 days s/p NSVD. She was induced for [facility] with SIPE but did not receive magnesium. She was discharged home on 3/10 with RX for lisinipril. She did not pick up lisinipril at the pharmacy. She then came back on March 17 and the March 18; received oral and IV anti-hypertensives. HA improved and she was discharged with procardia. She is unclear about what medicines she was supposed to be taking at home and when but does report that she took 30 mg of procardia today in the morning.  ?History  ?  ? ?CSN: [id number] ? ?Arrival date and time: 12/26/21 1935 ? ? Event Date/Time  ? First Provider Initiated Contact with Patient 12/26/21 2043   ?  ? ?Chief Complaint  ?Patient presents with  ? Headache  ? Hypertension  ? ?Headache  ?This is a recurrent problem. The current episode started in the past 7 days. The problem occurs constantly. The problem has been unchanged. The pain is located in the Frontal region. The pain is at a severity of 5/10. Pertinent negatives include no blurred vision, fever, nausea, visual change or vomiting. Her past medical history is significant for hypertension.  ?Hypertension ?This is a new (reports that she had been on blood pressure medicine in the past but that her blood pressure has been good for "a while now") problem. Associated symptoms include headaches. Pertinent negatives include no blurred vision.  ? ?OB History   ? ? Gravida  ?7  ? Para  ?4  ? Term  ?4  ? Preterm  ?0  ? AB  ?3  ? Living  ?4  ?  ? ? SAB  ?2  ? IAB  ?1  ? Ectopic  ?0  ? Multiple  ?0  ? Live Births  ?4  ?   ?  ?  ? ? ?Past Medical History:  ?Diagnosis Date  ? Chlamydia   ? Chronic hypertension   ? Chronic tonsillitis 09/12/2018  ? Depression   ? 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 Hx MRSA infection 07/14/2011  ? Hypertension 10/24/2013  ? Infection   ? ? ?Past Surgical History:  ?Procedure Laterality Date  ? DILATION AND EVACUATION N/A 09/19/2013  ? Procedure: DILATATION  AND EVACUATION;  Surgeon: Guss Bunde, MD;  Location: [facility];  Service: Gynecology;  Laterality: N/A;  ? TONSILLECTOMY N/A 09/12/2018  ? Procedure: TONSILLECTOMY;  Surgeon: Helayne Seminole, MD;  Location: [place];  Service: ENT;  Laterality: N/A;  ? ? ?Family History  ?Problem Relation Age of Onset  ? Anemia Mother   ? Hypertension Mother   ? Stroke Mother   ? Hypertension Father   ? Breast cancer Maternal Grandmother   ? Diabetes Maternal Grandmother   ? Hypertension Maternal Grandmother   ? ? ?Social History  ? ?Tobacco Use  ? Smoking status: Former  ?  Packs/day: 0.10  ?  Types: Cigarettes  ?  Quit date: 11/05/2015  ?  Years since quitting: 6.1  ? Smokeless tobacco: Former  ?Vaping Use  ? Vaping Use: Never used  ?Substance Use Topics  ? Alcohol use: Not Currently  ?  Comment: occasionally   ? Drug use: No  ? ? ?Allergies: No Known Allergies ? ?Medications Prior to Admission  ?Medication Sig Dispense Refill Last Dose  ? ibuprofen (ADVIL) 600 MG tablet Take 1 tablet (600 mg total) by mouth every 6 (six) hours. 30 tablet 0  12/26/2021 at 1200  ? [DISCONTINUED] NIFEdipine (PROCARDIA XL) 30 MG 24 hr tablet Take 1 tablet (30 mg total) by mouth daily. 30 tablet 2 12/26/2021 at 1200  ? acetaminophen (TYLENOL) 325 MG tablet Take 2 tablets (650 mg total) by mouth every 4 (four) hours as needed (for pain scale < 4). (Patient not taking: Reported on 12/26/2021)   Not Taking  ? furosemide (LASIX) 20 MG tablet Take 1 tablet (20 mg total) by mouth daily. (Patient not taking: Reported on 12/26/2021) 4 tablet 0 Not Taking  ? oxyCODONE (OXY IR/ROXICODONE) 5 MG immediate release tablet Take 1 tablet (5 mg total) by mouth every 6 (six) hours as needed for severe pain (pain scale >7). (Patient not taking: Reported on 12/26/2021) 5 tablet 0 Not Taking  ? Prenatal 27-1 MG TABS Take 1 tablet by mouth daily. (Patient not taking: Reported on 12/26/2021)   Not Taking  ? ? ?Review of Systems  ?Constitutional: Negative.   Negative for fever.  ?HENT: Negative.    ?Eyes:  Negative for blurred vision.  ?Respiratory: Negative.    ?Cardiovascular: Negative.   ?Gastrointestinal:  Negative for nausea and vomiting.  ?Genitourinary: Negative.   ?Neurological:  Positive for headaches.  ?Physical Exam  ? ?Blood pressure 126/63, pulse 71, temperature 98.5 ?F (36.9 ?C), temperature source Oral, resp. rate 18, height '5\' 6"'$  (1.676 m), weight 118.1 kg, SpO2 98 %, not currently breastfeeding. ? ?Physical Exam ?HENT:  ?   Head: Normocephalic.  ?Neurological:  ?   Mental Status: She is alert.  ? ? ?MAU Course  ?Procedures ? ?MDM ?Patient Vitals for the past 24 hrs: ? BP Temp Temp src Pulse Resp SpO2 Height Weight  ?12/26/21 2315 126/63 -- -- 71 -- -- -- --  ?12/26/21 2300 (!) 143/80 -- -- 73 -- -- -- --  ?12/26/21 2245 (!) 142/82 -- -- 73 -- -- -- --  ?12/26/21 2230 138/78 -- -- 71 -- -- -- --  ?12/26/21 2215 131/80 -- -- 74 -- -- -- --  ?12/26/21 2200 135/75 -- -- 74 -- -- -- --  ?12/26/21 2145 139/77 -- -- 73 -- -- -- --  ?12/26/21 2130 139/76 -- -- 73 -- -- -- --  ?12/26/21 2115 (!) 152/93 -- -- 71 -- 98 % -- --  ?12/26/21 2100 (!) 157/92 -- -- 69 -- 99 % -- --  ?12/26/21 2031 (!) 151/96 -- -- 77 -- -- -- --  ?12/26/21 2017 (!) 148/95 -- -- 77 -- -- -- --  ?12/26/21 2000 (!) 159/96 -- -- 73 -- -- -- --  ?12/26/21 1949 (!) 155/88 98.5 ?F (36.9 ?C) Oral 99 18 100 % '5\' 6"'$  (1.676 m) 118.1 kg  ?12/26/21 1948 -- -- -- -- -- 100 % -- --  ? ? ?-Patient labs are normal-normal CMP, CBC ?-patient received 30 mg of procardia in MAU and 1000 mg of Tylenol; she reports that her HA went down from a 5/10 to a 2/10.  ?-labs and presentation reviewed with Dr. Elonda Husky, who agrees patient stable for discharge.  ? ?Assessment and Plan  ? ?1. Chronic hypertension   ?-patient stable for discharge with plans to take procardia 30 mg BID (Dr. Brynda Greathouse recommendation) and keep BP appt on 3/20 at 1:30 at [facility] ?-all questions answered; teaching reinforced about importance of taking  BP meds and keeping BP check in office  ? ? ?Starr Lake ?12/26/2021, 11:50 PM  ?

## 2021-12-26 NOTE — MAU Note (Addendum)
Alexandria Fisher is a 31 y.o. PP vaginal delivery on March 8th. MAU reporting: headache unrelieved with motrin and rest. Denies visual changes , spots, or tunneling. Pt does report she had pain under her right rib cage around 2-3pm. Was intermittent sharp, shooting pain. Pt taking '30mg'$  Procardia took today at 1200. ? ?Pain score: 5 ?1949 ?BP 155/88 ?2000 ?BP 159/96 ?

## 2021-12-26 NOTE — Discharge Instructions (Signed)
-  take 30 mg of nifedipine (Procardia) twice a day--breakfast and after dinner is fine. DO NOT MISS DOSES--BP will bounce back up high if you miss!  ?

## 2021-12-27 ENCOUNTER — Ambulatory Visit (INDEPENDENT_AMBULATORY_CARE_PROVIDER_SITE_OTHER): Payer: Medicaid Other | Admitting: General Practice

## 2021-12-27 VITALS — BP 135/84 | HR 96 | Ht 66.0 in | Wt 255.0 lb

## 2021-12-27 DIAGNOSIS — Z0130 Encounter for examination of blood pressure without abnormal findings: Secondary | ICD-10-CM

## 2021-12-27 NOTE — Progress Notes (Signed)
Patient presents to office today for BP check following vaginal delivery on 3/8- hx significant for CHTN. Patient has been to MAU 3 times including last night for elevated home readings around 160s/100s. Patient was discharged home from MAU last night on Procardia '30mg'$  BID. Patient reports frequent headaches but denies dizziness or blurry vision. No edema noted today. BP 135/84 in office today- okay per Dr Elgie Congo. Patient to continue Procardia '30mg'$  BID. Discussed with patient. Advised patient to call us back with any BP questions/concerns. Patient verbalized understanding & will follow up on 4/20 for pp visit.  ? ?Koren Bound RN BSN ?12/27/21 ? ?

## 2021-12-29 ENCOUNTER — Encounter: Payer: Self-pay | Admitting: *Deleted

## 2022-01-27 ENCOUNTER — Ambulatory Visit: Payer: Medicaid Other | Admitting: Obstetrics & Gynecology

## 2022-02-07 MED ORDER — NIFEDIPINE ER OSMOTIC RELEASE 30 MG PO TB24: 30.0000 mg | ORAL_TABLET | Freq: Two times a day (BID) | ORAL | 2 refills | Status: DC

## 2022-02-21 ENCOUNTER — Ambulatory Visit: Payer: Medicaid Other | Admitting: Obstetrics & Gynecology

## 2022-02-28 ENCOUNTER — Ambulatory Visit (INDEPENDENT_AMBULATORY_CARE_PROVIDER_SITE_OTHER): Payer: Medicaid Other | Admitting: Advanced Practice Midwife

## 2022-02-28 ENCOUNTER — Encounter: Payer: Self-pay | Admitting: Advanced Practice Midwife

## 2022-02-28 NOTE — Progress Notes (Signed)
[facility] Partum Visit Note  Alexandria Fisher is a 31 y.o. [phone] female who presents for a postpartum visit. She is 10 weeks postpartum following a normal spontaneous vaginal delivery.  I have fully reviewed the prenatal and intrapartum course. The delivery was at 15w4dgestational weeks.  Anesthesia:  local 1% lidocaine . Postpartum course has been uncomplicated. Baby is doing well. Baby is feeding by bottle - Similac Advance. Bleeding  patient believes she is on her first cycle since delivery . Bowel function is normal. Bladder function is normal. Patient is not sexually active. Contraception method is abstinence. Postpartum depression screening: negative.  Patient is taking procardia XL '30mg'$  BID. She has 2 refills left on this. Will continue at this time and FU with PCP.    The pregnancy intention screening data noted above was reviewed. Potential methods of contraception were discussed. The patient unsure at this time. Will review as proceed when ready.     There are no preventive care reminders to display for this patient.  The following portions of the patient's history were reviewed and updated as appropriate: allergies, current medications, past family history, past medical history, past social history, past surgical history, and problem list.  Review of Systems Pertinent items are noted in HPI.  Objective:  There were no vitals taken for this visit.   General:  alert, cooperative, and no distress   Breasts:  normal  Lungs: clear to auscultation bilaterally  Heart:  regular rate and rhythm, S1, S2 normal, no murmur, click, rub or gallop  Abdomen: soft, non-tender; bowel sounds normal; no masses,  no organomegaly   Wound NA  GU exam:  not indicated         Edinburgh Postnatal Depression Scale - 02/28/22 1348       Edinburgh Postnatal Depression Scale:  In the Past 7 Days   I have been able to laugh and see the funny side of things. 0    I have looked forward with  enjoyment to things. 0    I have blamed myself unnecessarily when things went wrong. 2    I have been anxious or worried for no good reason. 2    I have felt scared or panicky for no good reason. 2    Things have been getting on top of me. 1    I have been so unhappy that I have had difficulty sleeping. 0    I have felt sad or miserable. 0    I have been so unhappy that I have been crying. 0    The thought of harming myself has occurred to me. 0    Edinburgh Postnatal Depression Scale Total 7             Assessment:    There are no diagnoses linked to this encounter.  routine postpartum exam.   Plan:   Essential components of care per ACOG recommendations:  1.  Mood and well being: Patient with negative depression screening today. Reviewed local resources for support.  - Patient tobacco use? No.   - hx of drug use? No.    2. Infant care and feeding:  -Patient currently breastmilk feeding? No.  -Social determinants of health (SDOH) reviewed in EPIC. No concerns.   3. Sexuality, contraception and birth spacing - Patient does not want a pregnancy in the next year.  Desired family size is 4 children.  - Reviewed reproductive life planning. Reviewed contraceptive methods based on pt preferences and effectiveness.  Patient desired Abstinence today.   - Discussed birth spacing of 18 months  4. Sleep and fatigue -Encouraged family/partner/community support of 4 hrs of uninterrupted sleep to help with mood and fatigue  5. Physical Recovery  - Discussed patients delivery and complications. She describes her labor as good. - Patient had a Vaginal, no problems at delivery. Patient had a 1st degree laceration. Perineal healing reviewed. Patient expressed understanding - Patient has urinary incontinence? No. - Patient is safe to resume physical and sexual activity  6.  Health Maintenance - HM due items addressed Yes - Last pap smear  Diagnosis  Date Value Ref Range Status   09/13/2019   Final   - Negative for intraepithelial lesion or malignancy (NILM)   Pap smear not done at today's visit.  -Breast Cancer screening indicated? No.   7. Chronic Disease/Pregnancy Condition follow up: Hypertension  - PCP follow up  [place], CNM  02/28/22  2:07 PM

## 2022-03-22 ENCOUNTER — Telehealth: Payer: Self-pay | Admitting: Lactation Services

## 2022-03-22 NOTE — Telephone Encounter (Signed)
Received Rx refill for Lisinopril. Per Chart review, patient is to be on Procardia. Per Pharmacist he has prescription for Procardia. Reviewed patient needs to follow up with her PCP for further refills.   Called patient to inform her of correct medication and she is taking the Procardia. She has not made an appointment with PCP, advised she needs to follow up with PCP for further refills. Patient voiced understanding.

## 2022-04-05 ENCOUNTER — Other Ambulatory Visit: Payer: Self-pay | Admitting: Student

## 2022-05-06 ENCOUNTER — Other Ambulatory Visit: Payer: Self-pay | Admitting: Obstetrics and Gynecology

## 2022-05-17 ENCOUNTER — Other Ambulatory Visit: Payer: Self-pay | Admitting: Obstetrics and Gynecology

## 2022-07-15 ENCOUNTER — Other Ambulatory Visit: Payer: Self-pay | Admitting: Student

## 2022-11-10 ENCOUNTER — Ambulatory Visit: Payer: Medicaid Other | Admitting: Physician Assistant

## 2022-11-18 ENCOUNTER — Ambulatory Visit: Payer: Medicaid Other

## 2022-11-21 ENCOUNTER — Ambulatory Visit: Payer: Medicaid Other

## 2022-11-22 ENCOUNTER — Ambulatory Visit: Payer: Medicaid Other

## 2022-11-30 ENCOUNTER — Ambulatory Visit: Payer: Medicaid Other | Admitting: Nurse Practitioner

## 2023-04-07 ENCOUNTER — Ambulatory Visit: Payer: Medicaid Other | Admitting: Nurse Practitioner

## 2023-04-11 ENCOUNTER — Encounter: Payer: Self-pay | Admitting: Nurse Practitioner

## 2023-04-11 ENCOUNTER — Ambulatory Visit (INDEPENDENT_AMBULATORY_CARE_PROVIDER_SITE_OTHER): Payer: Medicaid Other | Admitting: Nurse Practitioner

## 2023-04-11 VITALS — BP 138/98 | HR 77 | Temp 97.0°F | Ht 66.0 in | Wt 289.2 lb

## 2023-04-11 DIAGNOSIS — I10 Essential (primary) hypertension: Secondary | ICD-10-CM

## 2023-04-11 DIAGNOSIS — M79604 Pain in right leg: Secondary | ICD-10-CM

## 2023-04-11 DIAGNOSIS — Z1329 Encounter for screening for other suspected endocrine disorder: Secondary | ICD-10-CM | POA: Diagnosis not present

## 2023-04-11 DIAGNOSIS — K219 Gastro-esophageal reflux disease without esophagitis: Secondary | ICD-10-CM | POA: Insufficient documentation

## 2023-04-11 DIAGNOSIS — Z6841 Body Mass Index (BMI) 40.0 and over, adult: Secondary | ICD-10-CM

## 2023-04-11 DIAGNOSIS — E66813 Obesity, class 3: Secondary | ICD-10-CM | POA: Insufficient documentation

## 2023-04-11 DIAGNOSIS — Z1321 Encounter for screening for nutritional disorder: Secondary | ICD-10-CM

## 2023-04-11 DIAGNOSIS — Z0000 Encounter for general adult medical examination without abnormal findings: Secondary | ICD-10-CM | POA: Diagnosis not present

## 2023-04-11 DIAGNOSIS — Z13228 Encounter for screening for other metabolic disorders: Secondary | ICD-10-CM

## 2023-04-11 DIAGNOSIS — Z130 Encounter for screening for diseases of the blood and blood-forming organs and certain disorders involving the immune mechanism: Secondary | ICD-10-CM

## 2023-04-11 MED ORDER — HYDROCHLOROTHIAZIDE 12.5 MG PO TABS: 12.5000 mg | ORAL_TABLET | Freq: Every day | ORAL | 1 refills | Status: DC

## 2023-04-11 MED ORDER — PANTOPRAZOLE SODIUM 20 MG PO TBEC
20.0000 mg | DELAYED_RELEASE_TABLET | Freq: Every day | ORAL | 1 refills | Status: DC
Start: 2023-04-11 — End: 2023-06-07

## 2023-04-11 NOTE — Assessment & Plan Note (Signed)
Start pantoprazole 20 mg daily Education on GERD provided

## 2023-04-11 NOTE — Assessment & Plan Note (Signed)
Wt Readings from Last 3 Encounters:  04/11/23 289 lb 3.2 oz (131.2 kg)  02/28/22 263 lb 11.2 oz (119.6 kg)  12/27/21 255 lb (115.7 kg)   Body mass index is 46.68 kg/m.  Need to increase intake of whole food consisting mainly vegetables and protein less carbohydrate drinking at least 64 ounces of water daily, importance of portion control, regular moderate to vigorous exercises at least 150 minutes weekly, benefits of healthy weight also discussed Patient referred to medical weight management clinic

## 2023-04-11 NOTE — Assessment & Plan Note (Signed)
BP Readings from Last 3 Encounters:  04/11/23 (!) 138/98  02/28/22 130/76  12/27/21 135/84   HTN uncontrolled .  Start hydrochlorothiazide 12.5 mg daily Discussed DASH diet and dietary sodium restrictions Continue to increase dietary efforts and exercise.  CMP today BMP in 2 weeks Follow-up in 4 weeks

## 2023-04-11 NOTE — Assessment & Plan Note (Signed)
Could be due to her sleeping position Stretching exercises encouraged May take Tylenol as needed

## 2023-04-11 NOTE — Assessment & Plan Note (Signed)
Annual exam as documented.  Counseling done include healthy lifestyle involving committing to 150 minutes of exercise per week, heart healthy diet, and attaining healthy weight. The importance of adequate sleep also discussed.  Regular use of seat belt and home safety were also discussed . Changes in health habits are decided on by patient with goals and time frames set for achieving them. Immunization and cancer screening  needs are specifically addressed at this visit.    Routine fasting labs ordered Pap smear next visit

## 2023-04-11 NOTE — Patient Instructions (Signed)
Around 3 times per week, check your blood pressure 2 times per day. once in the morning and once in the evening. The readings should be at least one minute apart. Write down these values and bring them to your next nurse visit/appointment.  When you check your BP, make sure you have been doing something calm/relaxing 5 minutes prior to checking. Both feet should be flat on the floor and you should be sitting. Use your left arm and make sure it is in a relaxed position (on a table), and that the cuff is at the approximate level/height of your heart.    Primary hypertension  - hydrochlorothiazide (HYDRODIURIL) 12.5 MG tablet; Take 1 tablet (12.5 mg total) by mouth daily.  Dispense: 90 tablet; Refill: 1 - Basic Metabolic Panel; Future   Class 3 severe obesity with serious comorbidity and body mass index (BMI) of 45.0 to 49.9 in adult, unspecified obesity type (HCC)  - Amb Ref to Medical Weight Management     It is important that you exercise regularly at least 30 minutes 5 times a week as tolerated  Think about what you will eat, plan ahead. Choose " clean, green, fresh or frozen" over canned, processed or packaged foods which are more sugary, salty and fatty. 70 to 75% of food eaten should be vegetables and fruit. Three meals at set times with snacks allowed between meals, but they must be fruit or vegetables. Aim to eat over a 12 hour period , example 7 am to 7 pm, and STOP after  your last meal of the day. Drink water,generally about 64 ounces per day, no other drink is as healthy. Fruit juice is best enjoyed in a healthy way, by EATING the fruit.  Thanks for choosing [facility] we consider it a privelige to serve you.

## 2023-04-11 NOTE — Progress Notes (Signed)
Complete physical exam  Patient: Alexandria Fisher   DOB: 30-May-1991   32 y.o. Female  MRN: [id number]  Subjective:    Chief Complaint  Patient presents with   Annual Exam    Alexandria Fisher is a 32 y.o. 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  who presents today for a complete physical exam and to establish care  She reports consuming a general diet.  Stated that she has started doing some walking exercises She generally feels well. She reports sleeping well.   Hypertension.  Was on medication in the past but not currently.  She reports intermittent HA, edema .  No chest pain dizziness.  Stated that her mom died of stroke at age 70.  Right leg pain .patient complains of right leg ache only at night that started some months ago.  Has aching pain rated 8/10, pain does not occur every night.  Moving her legs are not and putting pressure on her leg relieves the pain.  She denies cramps, numbness, tingling.  Acid reflux .patient complains of acid reflux symptoms.  She denies nausea, vomiting, diarrhea.  Used to eats late at night but not anymore.  Not taking any medication currently  She plans to get her Pap smear at next visit..   Most recent fall risk assessment:    04/11/2023    8:08 AM  Fall Risk   Falls in the past year? 0  Number falls in past yr: 0  Injury with Fall? 0  Risk for fall due to : No Fall Risks  Follow up Falls evaluation completed     Most recent depression screenings:    04/11/2023    8:09 AM 12/15/2021   10:28 AM  PHQ 2/9 Scores  PHQ - 2 Score 0 2  PHQ- 9 Score 8 3        Patient Care Team: Cain Saupe, MD as PCP - General (Family Medicine)   Outpatient Medications Prior to Visit  Medication Sig   Multiple Vitamin (MULTIVITAMIN) capsule Take 1 capsule by mouth  daily.   [DISCONTINUED] hydrochlorothiazide (HYDRODIURIL) 25 MG tablet Take by mouth. (Patient not taking: Reported on 04/11/2023)   [DISCONTINUED] ibuprofen (ADVIL) 600 MG tablet Take 1 tablet (600 mg total) by mouth every 6 (six) hours. (Patient not taking: Reported on 04/11/2023)   [DISCONTINUED] NIFEdipine (PROCARDIA XL) 30 MG 24 hr tablet Take 1 tablet (30 mg total) by mouth 2 (two) times daily at 8 am and 10 pm. (Patient not taking: Reported on 04/11/2023)   [DISCONTINUED] Prenatal Vit-Fe Phos-FA-Omega (VITAFOL GUMMIES) 3.33-0.333-34.8 MG CHEW Chew 1 tablet by mouth daily. (Patient not taking: Reported on 04/11/2023)   No facility-administered medications prior to visit.    Review of Systems  Constitutional:  Negative for activity change, appetite change, chills, fatigue and fever.  HENT:  Negative for congestion, dental problem, ear discharge, ear pain, hearing loss, rhinorrhea, sinus pressure, sinus pain, sneezing and sore throat.   Eyes:  Negative for pain, discharge, redness and itching.  Respiratory:  Negative for cough, chest tightness, shortness of breath and wheezing.   Cardiovascular:  Negative for chest pain, palpitations and leg swelling.  Gastrointestinal:  Negative for abdominal distention, abdominal pain, anal bleeding, blood in stool, constipation, diarrhea, nausea, rectal pain and vomiting.  Endocrine: Negative for cold intolerance, heat intolerance, polydipsia, polyphagia and polyuria.  Genitourinary:  Negative  for difficulty urinating, dysuria, flank pain, frequency, hematuria, menstrual problem, pelvic pain and vaginal bleeding.  Musculoskeletal:  Positive for arthralgias. Negative for back pain, gait problem, joint swelling and myalgias.  Skin:  Negative for color change, pallor, rash and wound.  Allergic/Immunologic: Negative for environmental allergies, food allergies and immunocompromised state.  Neurological:  Negative for dizziness, tremors, facial asymmetry, weakness and  headaches.  Hematological:  Negative for adenopathy. Does not bruise/bleed easily.  Psychiatric/Behavioral:  Negative for agitation, behavioral problems, confusion, decreased concentration, hallucinations, self-injury and suicidal ideas.        Objective:     BP (!) 138/98   Pulse 77   Temp (!) 97 F (36.1 C)   Ht 5\' 6"  (1.676 m)   Wt 289 lb 3.2 oz (131.2 kg)   LMP 04/06/2023 (Approximate)   SpO2 100%   Breastfeeding No   BMI 46.68 kg/m    Physical Exam Vitals and nursing note reviewed. Exam conducted with a chaperone present.  Constitutional:      General: She is not in acute distress.    Appearance: Normal appearance. She is obese. She is not ill-appearing, toxic-appearing or diaphoretic.  HENT:     Right Ear: Tympanic membrane, ear canal and external ear normal. There is no impacted cerumen.     Left Ear: Tympanic membrane, ear canal and external ear normal. There is no impacted cerumen.     Nose: Nose normal. No congestion or rhinorrhea.     Mouth/Throat:     Mouth: Mucous membranes are moist.     Pharynx: Oropharynx is clear. No oropharyngeal exudate or posterior oropharyngeal erythema.  Eyes:     General: No scleral icterus.       Right eye: No discharge.        Left eye: No discharge.     Extraocular Movements: Extraocular movements intact.     Conjunctiva/sclera: Conjunctivae normal.  Neck:     Vascular: No carotid bruit.  Cardiovascular:     Rate and Rhythm: Normal rate and regular rhythm.     Pulses: Normal pulses.     Heart sounds: Normal heart sounds. No murmur heard.    No friction rub. No gallop.  Pulmonary:     Effort: Pulmonary effort is normal. No respiratory distress.     Breath sounds: Normal breath sounds. No stridor. No wheezing, rhonchi or rales.  Chest:     Chest wall: No tenderness.  Abdominal:     General: Bowel sounds are normal. There is no distension.     Palpations: Abdomen is soft. There is no mass.     Tenderness: There is no  abdominal tenderness. There is no right CVA tenderness, left CVA tenderness, guarding or rebound.     Hernia: No hernia is present.  Musculoskeletal:        General: No swelling, tenderness, deformity or signs of injury.     Cervical back: Normal range of motion and neck supple. No rigidity or tenderness.     Right lower leg: No edema.     Left lower leg: No edema.  Lymphadenopathy:     Cervical: No cervical adenopathy.  Skin:    General: Skin is warm and dry.     Capillary Refill: Capillary refill takes less than 2 seconds.     Coloration: Skin is not jaundiced or pale.     Findings: No bruising, erythema, lesion or rash.  Neurological:     Mental Status: She is alert and oriented to person,  place, and time.     Cranial Nerves: No cranial nerve deficit.     Sensory: No sensory deficit.     Motor: No weakness.     Coordination: Coordination normal.     Gait: Gait normal.     Deep Tendon Reflexes: Reflexes normal.  Psychiatric:        Mood and Affect: Mood normal.        Behavior: Behavior normal.        Thought Content: Thought content normal.        Judgment: Judgment normal.     No results found for any visits on 04/11/23.     Assessment & Plan:    Routine Health Maintenance and Physical Exam  Immunization History  Administered Date(s) Administered   PPD Test 12/04/2019   Tdap 07/15/2011, 06/01/2016, 09/30/2021    Health Maintenance  Topic Date Due   PAP SMEAR-Modifier  09/12/2022   DTaP/Tdap/Td (4 - Td or Tdap) 10/01/2031   Hepatitis C Screening  Completed   HIV Screening  Completed   HPV VACCINES  Aged Out   INFLUENZA VACCINE  Discontinued   COVID-19 Vaccine  Discontinued    Discussed health benefits of physical activity, and encouraged her to engage in regular exercise appropriate for her age and condition.  Problem List Items Addressed This Visit       Cardiovascular and Mediastinum   High blood pressure    BP Readings from Last 3 Encounters:   04/11/23 (!) 138/98  02/28/22 130/76  12/27/21 135/84   HTN uncontrolled .  Start hydrochlorothiazide 12.5 mg daily Discussed DASH diet and dietary sodium restrictions Continue to increase dietary efforts and exercise.  CMP today BMP in 2 weeks Follow-up in 4 weeks        Relevant Medications   hydrochlorothiazide (HYDRODIURIL) 12.5 MG tablet   Other Relevant Orders   Basic Metabolic Panel     Digestive   Gastroesophageal reflux disease    Start pantoprazole 20 mg daily Education on GERD provided      Relevant Medications   pantoprazole (PROTONIX) 20 MG tablet     Other   Screening for endocrine, nutritional, metabolic and immunity disorder - Primary   Relevant Orders   CBC with Differential/Platelet   CMP14+EGFR   Lipid panel   TSH   Annual physical exam    Annual exam as documented.  Counseling done include healthy lifestyle involving committing to 150 minutes of exercise per week, heart healthy diet, and attaining healthy weight. The importance of adequate sleep also discussed.  Regular use of seat belt and home safety were also discussed . Changes in health habits are decided on by patient with goals and time frames set for achieving them. Immunization and cancer screening  needs are specifically addressed at this visit.    Routine fasting labs ordered Pap smear next visit       Class 3 severe obesity with serious comorbidity and body mass index (BMI) of 45.0 to 49.9 in adult [facility])    Wt Readings from Last 3 Encounters:  04/11/23 289 lb 3.2 oz (131.2 kg)  02/28/22 263 lb 11.2 oz (119.6 kg)  12/27/21 255 lb (115.7 kg)   Body mass index is 46.68 kg/m.  Need to increase intake of whole food consisting mainly vegetables and protein less carbohydrate drinking at least 64 ounces of water daily, importance of portion control, regular moderate to vigorous exercises at least 150 minutes weekly, benefits of healthy weight also discussed Patient  referred to medical  weight management clinic      Relevant Orders   Amb Ref to Medical Weight Management   Right leg pain    Could be due to her sleeping position Stretching exercises encouraged May take Tylenol as needed      Return in about 4 weeks (around 05/09/2023) for HTN, PAP.  Labs in 2 weeks.     Donell Beers, FNP

## 2023-04-12 LAB — CBC WITH DIFFERENTIAL/PLATELET
Basophils Absolute: 0.1 10*3/uL (ref 0.0–0.2)
Basos: 1 %
EOS (ABSOLUTE): 0.2 10*3/uL (ref 0.0–0.4)
Eos: 2 %
Hematocrit: 36 % (ref 34.0–46.6)
Hemoglobin: 11.2 g/dL (ref 11.1–15.9)
Immature Grans (Abs): 0 10*3/uL (ref 0.0–0.1)
Immature Granulocytes: 0 %
Lymphocytes Absolute: 2 10*3/uL (ref 0.7–3.1)
Lymphs: 29 %
MCH: 25.1 pg — ABNORMAL LOW (ref 26.6–33.0)
MCHC: 31.1 g/dL — ABNORMAL LOW (ref 31.5–35.7)
MCV: 81 fL (ref 79–97)
Monocytes Absolute: 0.4 10*3/uL (ref 0.1–0.9)
Monocytes: 5 %
Neutrophils Absolute: 4.2 10*3/uL (ref 1.4–7.0)
Neutrophils: 63 %
Platelets: 258 10*3/uL (ref 150–450)
RBC: 4.46 x10E6/uL (ref 3.77–5.28)
RDW: 14.8 % (ref 11.7–15.4)
WBC: 6.8 10*3/uL (ref 3.4–10.8)

## 2023-04-12 LAB — LIPID PANEL
Chol/HDL Ratio: 3.6 ratio (ref 0.0–4.4)
Cholesterol, Total: 219 mg/dL — ABNORMAL HIGH (ref 100–199)
HDL: 61 mg/dL (ref >39)
LDL Chol Calc (NIH): 142 mg/dL — ABNORMAL HIGH (ref 0–99)
Triglycerides: 91 mg/dL (ref 0–149)
VLDL Cholesterol Cal: 16 mg/dL (ref 5–40)

## 2023-04-12 LAB — TSH: TSH: 1.08 u[IU]/mL (ref 0.450–4.500)

## 2023-04-12 LAB — CMP14+EGFR
ALT: 14 IU/L (ref 0–32)
AST: 15 IU/L (ref 0–40)
Albumin: 3.9 g/dL (ref 3.9–4.9)
Alkaline Phosphatase: 70 IU/L (ref 44–121)
BUN/Creatinine Ratio: 12 (ref 9–23)
BUN: 10 mg/dL (ref 6–20)
Bilirubin Total: 0.2 mg/dL (ref 0.0–1.2)
CO2: 23 mmol/L (ref 20–29)
Calcium: 9 mg/dL (ref 8.7–10.2)
Chloride: 105 mmol/L (ref 96–106)
Creatinine, Ser: 0.81 mg/dL (ref 0.57–1.00)
Globulin, Total: 2.9 g/dL (ref 1.5–4.5)
Glucose: 99 mg/dL (ref 70–99)
Potassium: 4.3 mmol/L (ref 3.5–5.2)
Sodium: 141 mmol/L (ref 134–144)
Total Protein: 6.8 g/dL (ref 6.0–8.5)
eGFR: 99 mL/min/{1.73_m2} (ref >59)

## 2023-04-25 ENCOUNTER — Other Ambulatory Visit: Payer: Medicaid Other

## 2023-04-25 DIAGNOSIS — I10 Essential (primary) hypertension: Secondary | ICD-10-CM

## 2023-04-26 LAB — BASIC METABOLIC PANEL
BUN/Creatinine Ratio: 15 (ref 9–23)
BUN: 13 mg/dL (ref 6–20)
CO2: 22 mmol/L (ref 20–29)
Calcium: 9.7 mg/dL (ref 8.7–10.2)
Chloride: 102 mmol/L (ref 96–106)
Creatinine, Ser: 0.87 mg/dL (ref 0.57–1.00)
Glucose: 104 mg/dL — ABNORMAL HIGH (ref 70–99)
Potassium: 4.2 mmol/L (ref 3.5–5.2)
Sodium: 138 mmol/L (ref 134–144)
eGFR: 91 mL/min/{1.73_m2} (ref >59)

## 2023-05-09 ENCOUNTER — Encounter: Payer: Self-pay | Admitting: Nurse Practitioner

## 2023-05-09 ENCOUNTER — Other Ambulatory Visit (HOSPITAL_COMMUNITY)
Admission: RE | Admit: 2023-05-09 | Discharge: 2023-05-09 | Disposition: A | Discharge status: Home / Self Care | Payer: Medicaid Other | Source: Ambulatory Visit | Attending: Nurse Practitioner | Admitting: Nurse Practitioner

## 2023-05-09 ENCOUNTER — Ambulatory Visit (INDEPENDENT_AMBULATORY_CARE_PROVIDER_SITE_OTHER): Payer: Medicaid Other | Admitting: Nurse Practitioner

## 2023-05-09 VITALS — BP 115/68 | HR 106 | Temp 97.1°F | Ht 66.0 in | Wt 281.6 lb

## 2023-05-09 DIAGNOSIS — E782 Mixed hyperlipidemia: Secondary | ICD-10-CM

## 2023-05-09 DIAGNOSIS — Z124 Encounter for screening for malignant neoplasm of cervix: Secondary | ICD-10-CM | POA: Insufficient documentation

## 2023-05-09 DIAGNOSIS — I10 Essential (primary) hypertension: Secondary | ICD-10-CM

## 2023-05-09 DIAGNOSIS — Z6841 Body Mass Index (BMI) 40.0 and over, adult: Secondary | ICD-10-CM

## 2023-05-09 DIAGNOSIS — D573 Sickle-cell trait: Secondary | ICD-10-CM

## 2023-05-09 DIAGNOSIS — H1011 Acute atopic conjunctivitis, right eye: Secondary | ICD-10-CM | POA: Insufficient documentation

## 2023-05-09 MED ORDER — OLOPATADINE HCL 0.1 % OP SOLN
1.0000 [drp] | Freq: Two times a day (BID) | OPHTHALMIC | 12 refills | Status: DC
Start: 2023-05-09 — End: 2023-12-26

## 2023-05-09 NOTE — Assessment & Plan Note (Signed)
Lab Results  Component Value Date   WBC 6.8 04/11/2023   HGB 11.2 04/11/2023   HCT 36.0 04/11/2023   MCV 81 04/11/2023   PLT 258 04/11/2023

## 2023-05-09 NOTE — Progress Notes (Signed)
Established Patient Office Visit  Subjective:  Patient ID: Alexandria Fisher, female    DOB: 1991/08/21  Age: 32 y.o. MRN: [id number]  CC:  Chief Complaint  Patient presents with   Follow-up   Gynecologic Exam    HPI Alexandria Fisher is a 32 y.o. 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  hyperlipidemia who presents for cervical Pap exam.   Hypertension.  Currently on hydrochlorothiazide 12.5 mg daily.  Patient denies dizziness, chest pain, edema.  She denies adverse reactions to current medications    Past Medical History:  Diagnosis Date   Chlamydia    Chronic hypertension    Chronic tonsillitis 09/12/2018   Depression    Hemoglobin A-S genotype (HCC) 01/04/2016   Hx MRSA infection 07/14/2011   Hypertension 10/24/2013   Infection     Past Surgical History:  Procedure Laterality Date   DILATION AND EVACUATION N/A 09/19/2013   Procedure: DILATATION AND EVACUATION;  Surgeon: Lesly Dukes, MD;  Location: [facility];  Service: Gynecology;  Laterality: N/A;   TONSILLECTOMY N/A 09/12/2018   Procedure: TONSILLECTOMY;  Surgeon: Graylin Shiver, MD;  Location: [facility];  Service: ENT;  Laterality: N/A;    Family History  Problem Relation Age of Onset   Anemia Mother    Hypertension Mother    Stroke Mother        died at age 27.   Hypertension Father    Breast cancer Maternal Grandmother    Diabetes Maternal Grandmother    Hypertension Maternal Grandmother    Colon cancer Neg Hx     Social History   Socioeconomic History   Marital status: Single    Spouse name: Not on file   Number of children: 4   Years of education: Not on file   Highest education level: Not on file  Occupational History   Not on file  Tobacco Use   Smoking status: Former    Current  packs/day: 0.00    Types: Cigarettes    Quit date: 11/05/2015    Years since quitting: 7.5   Smokeless tobacco: Former  Building services engineer status: Never Used  Substance and Sexual Activity   Alcohol use: Yes    Comment: occasionally    Drug use: No   Sexual activity: Not Currently    Birth control/protection: Condom  Other Topics Concern   Not on file  Social History Narrative   Lives at home with her children    Social Determinants of Health   Financial Resource Strain: Not on file  Food Insecurity: No Food Insecurity (08/17/2021)   Hunger Vital Sign    Worried About Running Out of Food in the Last Year: Never true    Ran Out of Food in the Last Year: Never true  Transportation Needs: No Transportation Needs (08/17/2021)   PRAPARE - Administrator, Civil Service (Medical): No    Lack of Transportation (Non-Medical): No  Physical Activity: Not on file  Stress: Not on file  Social Connections: Not on file  Intimate Partner Violence: Not on file    Outpatient Medications Prior to Visit  Medication Sig Dispense Refill   hydrochlorothiazide (HYDRODIURIL) 12.5 MG tablet Take 1 tablet (12.5 mg total) by mouth daily. 90 tablet 1   Multiple Vitamin (MULTIVITAMIN) capsule Take 1 capsule by mouth daily.  pantoprazole (PROTONIX) 20 MG tablet Take 1 tablet (20 mg total) by mouth daily. 60 tablet 1   No facility-administered medications prior to visit.    No Known Allergies  ROS Review of Systems  Constitutional: Negative.   Respiratory: Negative.    Cardiovascular: Negative.   Gastrointestinal: Negative.   Genitourinary: Negative.   Musculoskeletal: Negative.   Psychiatric/Behavioral: Negative.        Objective:    Physical Exam Vitals and nursing note reviewed. Exam conducted with a chaperone present.  Constitutional:      General: She is not in acute distress.    Appearance: Normal appearance. She is obese. She is not ill-appearing, toxic-appearing or  diaphoretic.  HENT:     Mouth/Throat:     Mouth: Mucous membranes are moist.     Pharynx: Oropharynx is clear. No oropharyngeal exudate or posterior oropharyngeal erythema.  Eyes:     General: No scleral icterus.       Right eye: No discharge.        Left eye: No discharge.     Extraocular Movements: Extraocular movements intact.     Conjunctiva/sclera: Conjunctivae normal.  Cardiovascular:     Rate and Rhythm: Normal rate and regular rhythm.     Pulses: Normal pulses.     Heart sounds: Normal heart sounds. No murmur heard.    No friction rub. No gallop.  Pulmonary:     Effort: Pulmonary effort is normal. No respiratory distress.     Breath sounds: Normal breath sounds. No stridor. No wheezing, rhonchi or rales.  Chest:     Chest wall: No mass, lacerations, deformity, swelling, tenderness or edema.  Breasts:    Tanner Score is 5.     Breasts are symmetrical.     Right: Normal. No swelling, bleeding, inverted nipple, mass, nipple discharge, skin change or tenderness.     Left: Normal. No swelling, bleeding, inverted nipple, mass, nipple discharge, skin change or tenderness.  Abdominal:     General: There is no distension.     Palpations: Abdomen is soft.     Tenderness: There is no abdominal tenderness. There is no right CVA tenderness, left CVA tenderness or guarding.     Hernia: There is no hernia in the left inguinal area or right inguinal area.  Genitourinary:    General: Normal vulva.     Exam position: Lithotomy position.     Pubic Area: No rash or pubic lice.      Tanner stage (genital): 5.     Labia:        Right: No rash, tenderness, lesion or injury.        Left: No rash, tenderness, lesion or injury.      Urethra: No prolapse, urethral pain, urethral swelling or urethral lesion.     Vagina: No signs of injury and foreign body. No vaginal discharge, erythema, tenderness, bleeding, lesions or prolapsed vaginal walls.     Cervix: No cervical motion tenderness,  discharge, friability, lesion, erythema, cervical bleeding or eversion.     Uterus: Normal. Not enlarged, not fixed, not tender and no uterine prolapse.      Adnexa:        Right: No mass, tenderness or fullness.         Left: No mass, tenderness or fullness.    Musculoskeletal:        General: No swelling, tenderness, deformity or signs of injury.     Right lower leg: No edema.  Left lower leg: No edema.  Lymphadenopathy:     Upper Body:     Right upper body: No supraclavicular, axillary or pectoral adenopathy.     Left upper body: No supraclavicular, axillary or pectoral adenopathy.     Lower Body: No right inguinal adenopathy. No left inguinal adenopathy.  Skin:    General: Skin is warm and dry.     Capillary Refill: Capillary refill takes less than 2 seconds.     Coloration: Skin is not jaundiced or pale.     Findings: No bruising, erythema or lesion.  Neurological:     Mental Status: She is alert and oriented to person, place, and time.     Motor: No weakness.     Coordination: Coordination normal.     Gait: Gait normal.  Psychiatric:        Mood and Affect: Mood normal.        Behavior: Behavior normal.        Thought Content: Thought content normal.        Judgment: Judgment normal.     BP 115/68   Pulse (!) 106   Temp (!) 97.1 F (36.2 C)   Ht 5\' 6"  (1.676 m)   Wt 281 lb 9.6 oz (127.7 kg)   LMP 05/04/2023 (Approximate)   SpO2 99%   Breastfeeding No   BMI 45.45 kg/m  Wt Readings from Last 3 Encounters:  05/09/23 281 lb 9.6 oz (127.7 kg)  04/11/23 289 lb 3.2 oz (131.2 kg)  02/28/22 263 lb 11.2 oz (119.6 kg)    Lab Results  Component Value Date   TSH 1.080 04/11/2023   Lab Results  Component Value Date   WBC 6.8 04/11/2023   HGB 11.2 04/11/2023   HCT 36.0 04/11/2023   MCV 81 04/11/2023   PLT 258 04/11/2023   Lab Results  Component Value Date   NA 138 04/25/2023   K 4.2 04/25/2023   CO2 22 04/25/2023   GLUCOSE 104 (H) 04/25/2023   BUN 13  04/25/2023   CREATININE 0.87 04/25/2023   BILITOT 0.2 04/11/2023   ALKPHOS 70 04/11/2023   AST 15 04/11/2023   ALT 14 04/11/2023   PROT 6.8 04/11/2023   ALBUMIN 3.9 04/11/2023   CALCIUM 9.7 04/25/2023   ANIONGAP 9 12/26/2021   EGFR 91 04/25/2023   Lab Results  Component Value Date   CHOL 219 (H) 04/11/2023   Lab Results  Component Value Date   HDL 61 04/11/2023   Lab Results  Component Value Date   LDLCALC 142 (H) 04/11/2023   Lab Results  Component Value Date   TRIG 91 04/11/2023   Lab Results  Component Value Date   CHOLHDL 3.6 04/11/2023   Lab Results  Component Value Date   HGBA1C 5.5 06/10/2021      Assessment & Plan:   Problem List Items Addressed This Visit       Cardiovascular and Mediastinum   High blood pressure    BP Readings from Last 3 Encounters:  05/09/23 115/68  04/11/23 (!) 138/98  02/28/22 130/76   HTN Controlled .  On hydrochlorothiazide 12.5 mg daily Continue current medications. No changes in management. Discussed DASH diet and dietary sodium restrictions Continue to increase dietary efforts and exercise.  Follow-up in 4 months        Other   Hemoglobin A-S genotype (HCC)    Lab Results  Component Value Date   WBC 6.8 04/11/2023   HGB 11.2 04/11/2023  HCT 36.0 04/11/2023   MCV 81 04/11/2023   PLT 258 04/11/2023         Class 3 severe obesity with serious comorbidity and body mass index (BMI) of 45.0 to 49.9 in adult (HCC)    Wt Readings from Last 3 Encounters:  05/09/23 281 lb 9.6 oz (127.7 kg)  04/11/23 289 lb 3.2 oz (131.2 kg)  02/28/22 263 lb 11.2 oz (119.6 kg)   Body mass index is 45.45 kg/m.  She has lost some 8 pounds since last visit Patient congratulated on her efforts and encouraged to continue to intensify efforts at losing weight She was counseled on low-carb modified diet encouraged to engage in regular moderate to vigorous exercises at least 150 minutes weekly      Mixed hyperlipidemia    Lab Results   Component Value Date   CHOL 219 (H) 04/11/2023   HDL 61 04/11/2023   LDLCALC 142 (H) 04/11/2023   TRIG 91 04/11/2023   CHOLHDL 3.6 04/11/2023  Avoid fatty fried foods lose weight      Screening for cervical cancer - Primary    Test completed without difficulty sample sent to the lab for testing      Relevant Orders   Cytology - PAP([facility])   Allergic conjunctivitis of right eye    Pink eye with clear watery discharge for the past 3 days, no fever, chills, itching   - olopatadine (PATANOL) 0.1 % ophthalmic solution; Place 1 drop into the right eye 2 (two) times daily.  Dispense: 5 mL; Refill: 12       Relevant Medications   olopatadine (PATANOL) 0.1 % ophthalmic solution    Meds ordered this encounter  Medications   olopatadine (PATANOL) 0.1 % ophthalmic solution    Sig: Place 1 drop into the right eye 2 (two) times daily.    Dispense:  5 mL    Refill:  12    Follow-up: Return in about 4 months (around 09/09/2023) for HTN.    Donell Beers, FNP

## 2023-05-09 NOTE — Assessment & Plan Note (Signed)
Wt Readings from Last 3 Encounters:  05/09/23 281 lb 9.6 oz (127.7 kg)  04/11/23 289 lb 3.2 oz (131.2 kg)  02/28/22 263 lb 11.2 oz (119.6 kg)   Body mass index is 45.45 kg/m.  She has lost some 8 pounds since last visit Patient congratulated on her efforts and encouraged to continue to intensify efforts at losing weight She was counseled on low-carb modified diet encouraged to engage in regular moderate to vigorous exercises at least 150 minutes weekly

## 2023-05-09 NOTE — Patient Instructions (Addendum)
1. Mixed hyperlipidemia   2. Screening for cervical cancer  - Cytology - PAP([place])  3. Allergic conjunctivitis of right eye  - olopatadine (PATANOL) 0.1 % ophthalmic solution; Place 1 drop into the right eye 2 (two) times daily.  Dispense: 5 mL; Refill: 12    It is important that you exercise regularly at least 30 minutes 5 times a week as tolerated  Think about what you will eat, plan ahead. Choose " clean, green, fresh or frozen" over canned, processed or packaged foods which are more sugary, salty and fatty. 70 to 75% of food eaten should be vegetables and fruit. Three meals at set times with snacks allowed between meals, but they must be fruit or vegetables. Aim to eat over a 12 hour period , example 7 am to 7 pm, and STOP after  your last meal of the day. Drink water,generally about 64 ounces per day, no other drink is as healthy. Fruit juice is best enjoyed in a healthy way, by EATING the fruit.  Thanks for choosing [facility] we consider it a privelige to serve you.

## 2023-05-09 NOTE — Progress Notes (Signed)
X °

## 2023-05-09 NOTE — Assessment & Plan Note (Signed)
BP Readings from Last 3 Encounters:  05/09/23 115/68  04/11/23 (!) 138/98  02/28/22 130/76   HTN Controlled .  On hydrochlorothiazide 12.5 mg daily Continue current medications. No changes in management. Discussed DASH diet and dietary sodium restrictions Continue to increase dietary efforts and exercise.  Follow-up in 4 months

## 2023-05-09 NOTE — Assessment & Plan Note (Signed)
Pink eye with clear watery discharge for the past 3 days, no fever, chills, itching   - olopatadine (PATANOL) 0.1 % ophthalmic solution; Place 1 drop into the right eye 2 (two) times daily.  Dispense: 5 mL; Refill: 12

## 2023-05-09 NOTE — Assessment & Plan Note (Signed)
Lab Results  Component Value Date   CHOL 219 (H) 04/11/2023   HDL 61 04/11/2023   LDLCALC 142 (H) 04/11/2023   TRIG 91 04/11/2023   CHOLHDL 3.6 04/11/2023  Avoid fatty fried foods lose weight

## 2023-05-09 NOTE — Assessment & Plan Note (Signed)
Test completed without difficulty sample sent to the lab for testing

## 2023-05-13 ENCOUNTER — Encounter (HOSPITAL_COMMUNITY): Payer: Self-pay

## 2023-05-13 ENCOUNTER — Emergency Department (HOSPITAL_COMMUNITY)
Admission: EM | Admit: 2023-05-13 | Discharge: 2023-05-13 | Disposition: A | Discharge status: Home / Self Care | Payer: Medicaid Other | Attending: Emergency Medicine | Admitting: Emergency Medicine

## 2023-05-13 ENCOUNTER — Other Ambulatory Visit: Payer: Self-pay

## 2023-05-13 DIAGNOSIS — H5711 Ocular pain, right eye: Secondary | ICD-10-CM | POA: Diagnosis present

## 2023-05-13 DIAGNOSIS — I10 Essential (primary) hypertension: Secondary | ICD-10-CM | POA: Insufficient documentation

## 2023-05-13 DIAGNOSIS — Z79899 Other long term (current) drug therapy: Secondary | ICD-10-CM | POA: Diagnosis not present

## 2023-05-13 DIAGNOSIS — B309 Viral conjunctivitis, unspecified: Secondary | ICD-10-CM | POA: Insufficient documentation

## 2023-05-13 MED ORDER — FLUORESCEIN SODIUM 1 MG OP STRP
ORAL_STRIP | OPHTHALMIC | Status: AC
  Filled 2023-05-13: qty 1

## 2023-05-13 MED ORDER — TETRACAINE HCL 0.5 % OP SOLN
1.0000 [drp] | Freq: Once | OPHTHALMIC | Status: AC
  Administered 2023-05-13: 1 [drp] via OPHTHALMIC
  Filled 2023-05-13: qty 4

## 2023-05-13 MED ORDER — ERYTHROMYCIN 5 MG/GM OP OINT
1.0000 | TOPICAL_OINTMENT | Freq: Once | OPHTHALMIC | Status: AC
  Administered 2023-05-13: 1 via OPHTHALMIC
  Filled 2023-05-13: qty 3.5

## 2023-05-13 NOTE — ED Provider Notes (Signed)
[place] EMERGENCY DEPARTMENT AT [facility] Provider Note   CSN: [id number] Arrival date & time: 05/13/23  1643     History  Chief Complaint  Patient presents with   Eye Problem    Alexandria Fisher is a 32 y.o. female.  32 y.o female with a PMH of HTN presents to the ED with chief complaint of right e ye pain x 1 week.   The history is provided by the patient.  Eye Problem Location:  Right eye Quality:  Foreign body sensation, tearing and sharp Severity:  Moderate Onset quality:  Gradual Duration:  1 week Timing:  Constant Progression:  Unchanged Context: not contact lens problem, not direct trauma and not foreign body   Relieved by:  Nothing Worsened by:  Bright light Ineffective treatments:  Eye drops Associated symptoms: discharge and tearing   Associated symptoms: no blurred vision, no double vision, no headaches, no itching, no nausea, no numbness and no photophobia        Home Medications Prior to Admission medications   Medication Sig Start Date End Date Taking? Authorizing Provider  hydrochlorothiazide (HYDRODIURIL) 12.5 MG tablet Take 1 tablet (12.5 mg total) by mouth daily. 04/11/23   Donell Beers, FNP  Multiple Vitamin (MULTIVITAMIN) capsule Take 1 capsule by mouth daily. 07/24/18   [provider]  olopatadine (PATANOL) 0.1 % ophthalmic solution Place 1 drop into the right eye 2 (two) times daily. 05/09/23   Paseda, Baird Kay, FNP  pantoprazole (PROTONIX) 20 MG tablet Take 1 tablet (20 mg total) by mouth daily. 04/11/23   Donell Beers, FNP      Allergies    Patient has no known allergies.    Review of Systems   Review of Systems  Constitutional:  Negative for fever.  Eyes:  Positive for discharge. Negative for blurred vision, double vision, photophobia and itching.  Gastrointestinal:  Negative for nausea.  Neurological:  Negative for numbness and headaches.    Physical Exam Updated Vital Signs BP (!) 143/97 (BP  Location: Right Arm)   Pulse 93   Temp 98.1 F (36.7 C) (Oral)   Resp 15   Wt 127.5 kg   LMP 05/04/2023 (Approximate)   SpO2 100%   BMI 45.35 kg/m   Visual Acuity  L- 10/8 R- 10/8 B-10/8     Physical Exam Vitals and nursing note reviewed.  Constitutional:      Appearance: Normal appearance.  HENT:     Head: Normocephalic and atraumatic.     Mouth/Throat:     Mouth: Mucous membranes are dry.  Eyes:     General: Lids are normal.        Right eye: Discharge present.     Intraocular pressure: Right eye pressure is 21 mmHg. Left eye pressure is 21 mmHg. Measurements were taken using a handheld tonometer.    Extraocular Movements:     Right eye: Normal extraocular motion.     Left eye: Normal extraocular motion.     Conjunctiva/sclera:     Right eye: Right conjunctiva is injected.     Left eye: Left conjunctiva is not injected.     Pupils: Pupils are equal, round, and reactive to light.     Comments: Right eye appears injected, normal extraocular movements, green crusting discharge.  Cardiovascular:     Rate and Rhythm: Normal rate.  Pulmonary:     Effort: Pulmonary effort is normal.  Abdominal:     General: Abdomen is flat.  Palpations: Abdomen is soft.  Musculoskeletal:     Cervical back: Normal range of motion and neck supple.  Skin:    General: Skin is warm and dry.  Neurological:     Mental Status: She is alert and oriented to person, place, and time.      ED Results / Procedures / Treatments   Labs (all labs ordered are listed, but only abnormal results are displayed) Labs Reviewed - No data to display  EKG None  Radiology No results found.  Procedures Procedures    Medications Ordered in ED Medications  fluorescein 1 MG ophthalmic strip (  Canceled Entry 05/13/23 1835)  erythromycin ophthalmic ointment 1 Application (has no administration in time range)  tetracaine (PONTOCAINE) 0.5 % ophthalmic solution 1 drop (1 drop Right Eye Given 05/13/23  1835)    ED Course/ Medical Decision Making/ A&P                                 Medical Decision Making Risk Prescription drug management.   Patient presents to the ED with a week of right eye injection, tearing, drainage.  Evaluated by her PCP, placed on olopatadine which she has been using reports no improvement in symptoms.  Evaluated urgent care today, had a negative exam without any corneal abrasion or ulceration.  She is here today as she reports continued pain to her right eye exacerbated with any bright light.  Does not wear contacts at baseline.  Patient had Tono-Pen reads by me which are symmetric at 21 for both.  She does not have any pain with extraocular movements.  There is no changes in the skin surrounding to suggest any cellulitis or further infection.  Suspect viral etiology of conjunctivitis, we discussed symptomatic treatment with erythromycin ointment.  Patient is hemodynamically stable for discharge.    Portions of this note were generated with Scientist, clinical (histocompatibility and immunogenetics). Dictation errors may occur despite best attempts at proofreading.   Final Clinical Impression(s) / ED Diagnoses Final diagnoses:  Viral conjunctivitis    Rx / DC Orders ED Discharge Orders     None         Claude Manges, PA-C 05/13/23 1854    Gwyneth Sprout, MD 05/14/23 1504

## 2023-05-13 NOTE — ED Triage Notes (Signed)
Pt arrives with c/o right eye pain and irritation that started 5 days ago. Pt endorses drainage. Pt denies fevers. Per pt, she was prescribed eye drops without relief.

## 2023-05-13 NOTE — ED Notes (Signed)
Visual Acuity  L- 10/8 R- 10/8 B-10/8

## 2023-05-13 NOTE — Discharge Instructions (Addendum)
You were given erythromycin ointment while in the ED, please apply half of an inch to your right eye 4-6 times daily for the next 5 days.  The number to ophthalmology is attached to your chart, please schedule an appointment for further management if your symptoms do not improve.

## 2023-05-23 ENCOUNTER — Other Ambulatory Visit: Payer: Self-pay | Admitting: Family Medicine

## 2023-05-29 ENCOUNTER — Ambulatory Visit: Payer: Medicaid Other | Admitting: Family Medicine

## 2023-06-07 ENCOUNTER — Other Ambulatory Visit: Payer: Self-pay | Admitting: Nurse Practitioner

## 2023-06-07 DIAGNOSIS — K219 Gastro-esophageal reflux disease without esophagitis: Secondary | ICD-10-CM

## 2023-07-03 ENCOUNTER — Other Ambulatory Visit: Payer: Self-pay | Admitting: General Practice

## 2023-07-03 MED ORDER — VITAFOL GUMMIES 3.33-0.333-34.8 MG PO CHEW: 1.0000 | CHEWABLE_TABLET | Freq: Every day | ORAL | 11 refills | Status: AC

## 2023-07-07 ENCOUNTER — Other Ambulatory Visit: Payer: Self-pay | Admitting: Nurse Practitioner

## 2023-07-07 DIAGNOSIS — I10 Essential (primary) hypertension: Secondary | ICD-10-CM

## 2023-07-07 MED ORDER — HYDROCHLOROTHIAZIDE 12.5 MG PO TABS
12.5000 mg | ORAL_TABLET | Freq: Every day | ORAL | 1 refills | Status: DC
Start: 2023-07-07 — End: 2023-12-26

## 2023-09-08 ENCOUNTER — Ambulatory Visit: Payer: Self-pay | Admitting: Nurse Practitioner

## 2023-09-13 ENCOUNTER — Ambulatory Visit: Payer: Self-pay | Admitting: Nurse Practitioner

## 2023-11-10 ENCOUNTER — Ambulatory Visit: Payer: Medicaid Other | Admitting: Nurse Practitioner

## 2023-12-06 ENCOUNTER — Ambulatory Visit: Payer: Medicaid Other | Admitting: Nurse Practitioner

## 2023-12-26 ENCOUNTER — Ambulatory Visit (INDEPENDENT_AMBULATORY_CARE_PROVIDER_SITE_OTHER): Payer: Medicaid Other | Admitting: Nurse Practitioner

## 2023-12-26 ENCOUNTER — Encounter: Payer: Self-pay | Admitting: Nurse Practitioner

## 2023-12-26 VITALS — BP 129/77 | HR 83 | Temp 98.1°F | Wt 293.2 lb

## 2023-12-26 DIAGNOSIS — E782 Mixed hyperlipidemia: Secondary | ICD-10-CM | POA: Diagnosis not present

## 2023-12-26 DIAGNOSIS — Z6841 Body Mass Index (BMI) 40.0 and over, adult: Secondary | ICD-10-CM | POA: Diagnosis not present

## 2023-12-26 DIAGNOSIS — I10 Essential (primary) hypertension: Secondary | ICD-10-CM

## 2023-12-26 MED ORDER — HYDROCHLOROTHIAZIDE 12.5 MG PO TABS
12.5000 mg | ORAL_TABLET | Freq: Every day | ORAL | 1 refills | Status: AC
Start: 2023-12-26 — End: ?

## 2023-12-26 NOTE — Assessment & Plan Note (Signed)
 Lab Results  Component Value Date   CHOL 219 (H) 04/11/2023   HDL 61 04/11/2023   LDLCALC 142 (H) 04/11/2023   TRIG 91 04/11/2023   CHOLHDL 3.6 04/11/2023  Avoid fatty fried foods Lose weight

## 2023-12-26 NOTE — Assessment & Plan Note (Signed)
 Continue hydrochlorothiazide 12.5 mg daily DASH diet and commitment to daily physical activity for a minimum of 30 minutes discussed and encouraged, as a part of hypertension management. The importance of attaining a healthy weight is also discussed.     12/26/2023   10:34 AM 12/26/2023   10:01 AM 12/26/2023    9:56 AM 05/13/2023    5:08 PM 05/13/2023    5:01 PM 05/09/2023    8:10 AM 04/11/2023    8:42 AM  BP/Weight  Systolic BP 129 144 134  143 115 138  Diastolic BP 77 88 103  97 68 98  Wt. (Lbs)   293.2 281  281.6   BMI   47.32 kg/m2 45.35 kg/m2  45.45 kg/m2

## 2023-12-26 NOTE — Patient Instructions (Signed)
 1. BMI 40.0-44.9, adult (HCC) (Primary)   2. Primary hypertension  - CMP14+EGFR - AMB Referral [facility] - hydrochlorothiazide (HYDRODIURIL) 12.5 MG tablet; Take 1 tablet (12.5 mg total) by mouth daily.  Dispense: 90 tablet; Refill: 1    It is important that you exercise regularly at least 30 minutes 5 times a week as tolerated  Think about what you will eat, plan ahead. Choose " clean, green, fresh or frozen" over canned, processed or packaged foods which are more sugary, salty and fatty. 70 to 75% of food eaten should be vegetables and fruit. Three meals at set times with snacks allowed between meals, but they must be fruit or vegetables. Aim to eat over a 12 hour period , example 7 am to 7 pm, and STOP after  your last meal of the day. Drink water,generally about 64 ounces per day, no other drink is as healthy. Fruit juice is best enjoyed in a healthy way, by EATING the fruit.  Thanks for choosing [facility] we consider it a privelige to serve you.

## 2023-12-26 NOTE — Assessment & Plan Note (Addendum)
 Wt Readings from Last 3 Encounters:  12/26/23 293 lb 3.2 oz (133 kg)  05/13/23 281 lb (127.5 kg)  05/09/23 281 lb 9.6 oz (127.7 kg)   Body mass index is 47.32 kg/m.  Patient has gained 12 pounds since her last visit Not excercising , plans to start exercising States that she has been doing portion control Patient counseled on low-carb diet Encouraged to engage in regular moderate to vigorous exercises at least 150 minutes weekly Benefits of healthy weights discussed States that she is trying to lose weight naturally, not interested in starting medication  .

## 2023-12-26 NOTE — Progress Notes (Addendum)
 Established Patient Office Visit  Subjective:  Patient ID: Alexandria Fisher, female    DOB: [birth year]  Age: 33 y.o. MRN: [id number]  CC:  Chief Complaint  Patient presents with   Medical Management of Chronic Issues   Hypertension    HPI Alexandria Fisher is a 33 y.o. 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  Patient presents for follow-up for hypertension  Hypertension.  Currently on hydrochlorothiazide  12.5 mg daily.  Patient denies chest pain, shortness of breath, edema, needs blood pressure cuff for home blood pressure monitoring  Patient denies adverse reactions to current medications  Past Medical History:  Diagnosis Date   Chlamydia    Chronic hypertension    Chronic tonsillitis 09/12/2018   Depression    Hemoglobin A-S genotype (HCC) 01/04/2016   Hx MRSA infection 07/14/2011   Hypertension 10/24/2013   Infection     Past Surgical History:  Procedure Laterality Date   DILATION AND EVACUATION N/A 09/19/2013   Procedure: DILATATION AND EVACUATION;  Surgeon: Burnard VEAR Pate, MD;  Location: [facility];  Service: Gynecology;  Laterality: N/A;   TONSILLECTOMY N/A 09/12/2018   Procedure: TONSILLECTOMY;  Surgeon: Terri Alan PARAS, MD;  Location: [facility];  Service: ENT;  Laterality: N/A;    Family History  Problem Relation Age of Onset   Anemia Mother    Hypertension Mother    Stroke Mother        died at age 41.   Hypertension Father    Breast cancer Maternal Grandmother    Diabetes Maternal Grandmother    Hypertension Maternal Grandmother    Colon cancer Neg Hx     Social History   Socioeconomic History   Marital status: Single    Spouse name: Not on file   Number of children: 4   Years of education: Not on file   Highest education level: Not on file   Occupational History   Not on file  Tobacco Use   Smoking status: Former    Current packs/day: 0.00    Types: Cigarettes    Quit date: 11/05/2015    Years since quitting: 8.4   Smokeless tobacco: Former  Building services engineer status: Never Used  Substance and Sexual Activity   Alcohol use: Yes    Comment: occasionally    Drug use: No   Sexual activity: Not Currently    Birth control/protection: Condom  Other Topics Concern   Not on file  Social History Narrative   Lives at home with her children    Social Drivers of Health   Financial Resource Strain: Not on file  Food Insecurity: No Food Insecurity (12/26/2023)   Hunger Vital Sign    Worried About Running Out of Food in the Last Year: Never true    Ran Out of Food in the Last Year: Never true  Transportation Needs: No Transportation Needs (12/26/2023)   PRAPARE - Administrator, Civil Service (Medical): No    Lack of Transportation (Non-Medical): No  Physical Activity: Not on file  Stress: Not on file  Social Connections: Not on file  Intimate Partner Violence: Not on file    Outpatient Medications Prior to Visit  Medication Sig Dispense Refill   Prenatal Vit-Fe Phos-FA-Omega (VITAFOL  GUMMIES) 3.33-0.333-34.8 MG CHEW Chew 1 tablet by mouth daily. 30 tablet 11   hydrochlorothiazide  (HYDRODIURIL ) 12.5  MG tablet Take 1 tablet (12.5 mg total) by mouth daily. 90 tablet 1   Multiple Vitamin (MULTIVITAMIN) capsule Take 1 capsule by mouth daily.     olopatadine  (PATANOL) 0.1 % ophthalmic solution Place 1 drop into the right eye 2 (two) times daily. (Patient not taking: Reported on 12/26/2023) 5 mL 12   pantoprazole  (PROTONIX ) 20 MG tablet Take 1 tablet (20 mg total) by mouth daily. 60 tablet 1   No facility-administered medications prior to visit.    No Known Allergies  ROS Review of Systems  Constitutional:  Negative for appetite change, chills, fatigue and fever.  HENT:  Negative for congestion, postnasal  drip, rhinorrhea and sneezing.   Respiratory:  Negative for cough, shortness of breath and wheezing.   Cardiovascular:  Negative for chest pain, palpitations and leg swelling.  Gastrointestinal:  Negative for abdominal pain, constipation, nausea and vomiting.  Genitourinary:  Negative for difficulty urinating, dysuria, flank pain and frequency.  Musculoskeletal:  Negative for arthralgias, back pain, joint swelling and myalgias.  Skin:  Negative for color change, pallor, rash and wound.  Neurological:  Negative for dizziness, facial asymmetry, weakness, numbness and headaches.  Psychiatric/Behavioral:  Negative for behavioral problems, confusion, self-injury and suicidal ideas.       Objective:    Physical Exam Vitals and nursing note reviewed.  Constitutional:      General: She is not in acute distress.    Appearance: Normal appearance. She is obese. She is not ill-appearing, toxic-appearing or diaphoretic.  HENT:     Mouth/Throat:     Mouth: Mucous membranes are moist.     Pharynx: Oropharynx is clear. No oropharyngeal exudate or posterior oropharyngeal erythema.  Eyes:     General: No scleral icterus.       Right eye: No discharge.        Left eye: No discharge.     Extraocular Movements: Extraocular movements intact.     Conjunctiva/sclera: Conjunctivae normal.  Cardiovascular:     Rate and Rhythm: Normal rate and regular rhythm.     Pulses: Normal pulses.     Heart sounds: Normal heart sounds. No murmur heard.    No friction rub. No gallop.  Pulmonary:     Effort: Pulmonary effort is normal. No respiratory distress.     Breath sounds: Normal breath sounds. No stridor. No wheezing, rhonchi or rales.  Chest:     Chest wall: No tenderness.  Abdominal:     General: There is no distension.     Palpations: Abdomen is soft.     Tenderness: There is no abdominal tenderness. There is no right CVA tenderness, left CVA tenderness or guarding.  Musculoskeletal:        General: No  swelling, tenderness, deformity or signs of injury.     Right lower leg: No edema.     Left lower leg: No edema.  Skin:    General: Skin is warm and dry.     Capillary Refill: Capillary refill takes less than 2 seconds.     Coloration: Skin is not jaundiced or pale.     Findings: No bruising, erythema or lesion.  Neurological:     Mental Status: She is alert and oriented to person, place, and time.     Motor: No weakness.     Coordination: Coordination normal.     Gait: Gait normal.  Psychiatric:        Mood and Affect: Mood normal.        Behavior:  Behavior normal.        Thought Content: Thought content normal.        Judgment: Judgment normal.     BP 129/77   Pulse 83   Temp 98.1 F (36.7 C) (Oral)   Wt 293 lb 3.2 oz (133 kg)   SpO2 98%   BMI 47.32 kg/m  Wt Readings from Last 3 Encounters:  04/26/24 279 lb (126.6 kg)  12/26/23 293 lb 3.2 oz (133 kg)  05/13/23 281 lb (127.5 kg)    Lab Results  Component Value Date   TSH 1.080 04/11/2023   Lab Results  Component Value Date   WBC 6.8 04/11/2023   HGB 11.2 04/11/2023   HCT 36.0 04/11/2023   MCV 81 04/11/2023   PLT 258 04/11/2023   Lab Results  Component Value Date   NA 139 12/26/2023   K 4.7 12/26/2023   CO2 25 12/26/2023   GLUCOSE 96 12/26/2023   BUN 10 12/26/2023   CREATININE 0.78 12/26/2023   BILITOT 0.3 12/26/2023   ALKPHOS 65 12/26/2023   AST 15 12/26/2023   ALT 16 12/26/2023   PROT 7.0 12/26/2023   ALBUMIN 4.0 12/26/2023   CALCIUM 9.6 12/26/2023   ANIONGAP 9 12/26/2021   EGFR 103 12/26/2023   Lab Results  Component Value Date   CHOL 219 (H) 04/11/2023   Lab Results  Component Value Date   HDL 61 04/11/2023   Lab Results  Component Value Date   LDLCALC 142 (H) 04/11/2023   Lab Results  Component Value Date   TRIG 91 04/11/2023   Lab Results  Component Value Date   CHOLHDL 3.6 04/11/2023   Lab Results  Component Value Date   HGBA1C 5.5 06/10/2021      Assessment & Plan:    Problem List Items Addressed This Visit       Cardiovascular and Mediastinum   High blood pressure   Continue hydrochlorothiazide  12.5 mg daily DASH diet and commitment to daily physical activity for a minimum of 30 minutes discussed and encouraged, as a part of hypertension management. The importance of attaining a healthy weight is also discussed.     12/26/2023   10:34 AM 12/26/2023   10:01 AM 12/26/2023    9:56 AM 05/13/2023    5:08 PM 05/13/2023    5:01 PM 05/09/2023    8:10 AM 04/11/2023    8:42 AM  BP/Weight  Systolic BP 129 144 134  143 115 138  Diastolic BP 77 88 103  97 68 98  Wt. (Lbs)   293.2 281  281.6   BMI   47.32 kg/m2 45.35 kg/m2  45.45 kg/m2            Relevant Orders   CMP14+EGFR (Completed)   AMB Referral [facility] Care Management     Other   BMI 40.0-44.9, adult (HCC) - Primary   Wt Readings from Last 3 Encounters:  12/26/23 293 lb 3.2 oz (133 kg)  05/13/23 281 lb (127.5 kg)  05/09/23 281 lb 9.6 oz (127.7 kg)   Body mass index is 47.32 kg/m.  Patient has gained 12 pounds since her last visit Not excercising , plans to start exercising States that she has been doing portion control Patient counseled on low-carb diet Encouraged to engage in regular moderate to vigorous exercises at least 150 minutes weekly Benefits of healthy weights discussed States that she is trying to lose weight naturally, not interested in starting medication  .  Mixed hyperlipidemia   Lab Results  Component Value Date   CHOL 219 (H) 04/11/2023   HDL 61 04/11/2023   LDLCALC 142 (H) 04/11/2023   TRIG 91 04/11/2023   CHOLHDL 3.6 04/11/2023  Avoid fatty fried foods Lose weight       Meds ordered this encounter  Medications   DISCONTD: hydrochlorothiazide  (HYDRODIURIL ) 12.5 MG tablet    Sig: Take 1 tablet (12.5 mg total) by mouth daily.    Dispense:  90 tablet    Refill:  1    Follow-up: Return in about 4 months (around 04/26/2024) for CPE.    Ashantia Amaral R  Davaughn Hillyard, FNP

## 2023-12-27 LAB — CMP14+EGFR
ALT: 16 IU/L (ref 0–32)
AST: 15 IU/L (ref 0–40)
Albumin: 4 g/dL (ref 3.9–4.9)
Alkaline Phosphatase: 65 IU/L (ref 44–121)
BUN/Creatinine Ratio: 13 (ref 9–23)
BUN: 10 mg/dL (ref 6–20)
Bilirubin Total: 0.3 mg/dL (ref 0.0–1.2)
CO2: 25 mmol/L (ref 20–29)
Calcium: 9.6 mg/dL (ref 8.7–10.2)
Chloride: 103 mmol/L (ref 96–106)
Creatinine, Ser: 0.78 mg/dL (ref 0.57–1.00)
Globulin, Total: 3 g/dL (ref 1.5–4.5)
Glucose: 96 mg/dL (ref 70–99)
Potassium: 4.7 mmol/L (ref 3.5–5.2)
Sodium: 139 mmol/L (ref 134–144)
Total Protein: 7 g/dL (ref 6.0–8.5)
eGFR: 103 mL/min/{1.73_m2} (ref >59)

## 2023-12-28 ENCOUNTER — Telehealth: Payer: Self-pay | Admitting: *Deleted

## 2023-12-28 NOTE — Progress Notes (Signed)
 Care Guide Pharmacy Note  12/28/2023 Name: Alexandria Fisher MRN: [id number] DOB: [birth year]  Referred By: Donell Beers, FNP Reason for referral: Care Coordination (Initial outreach to schedule referral with PharmD)   Alexandria Fisher is a 33 y.o. year old female who is a primary care patient of Donell Beers, FNP.  Alexandria Fisher was referred to the pharmacist for assistance related to: HTN Blood pressure cuff.   Successful contact was made with the patient to discuss pharmacy services including being ready for the pharmacist to call at least 5 minutes before the scheduled appointment time and to have medication bottles and any blood pressure readings ready for review. The patient agreed to meet with the pharmacist via telephone visit on (date/time).4/1 at 1:00 PM  Alexandria Fisher  [facility]  Direct Dial: [phone]  Fax [phone]

## 2024-01-09 ENCOUNTER — Telehealth: Payer: Self-pay

## 2024-01-09 ENCOUNTER — Other Ambulatory Visit: Payer: Self-pay

## 2024-01-09 DIAGNOSIS — Z79899 Other long term (current) drug therapy: Secondary | ICD-10-CM

## 2024-01-09 NOTE — Progress Notes (Signed)
   01/09/2024  Patient ID: Alexandria Fisher, female   DOB: 1991-01-10, 33 y.o.   MRN: [id number]   Care Coordination Call Emailed Gordy Councilman with [facility], via secure email, with information for prescription order. Awaiting PCP to place order with diagnosis code.   Cephus Shelling, PharmD Clinical Pharmacist Cell: [phone]

## 2024-01-09 NOTE — Telephone Encounter (Signed)
 Sent to pharmacy

## 2024-01-09 NOTE — Progress Notes (Addendum)
 01/09/2024 Name: Alexandria Fisher MRN: [id number] DOB: 1990-10-26  Chief Complaint  Patient presents with   Hypertension    Alexandria Fisher is a 33 y.o. year old female who presented for a telephone visit.   They were referred to the pharmacist by their PCP for assistance in obtaining blood pressure monitor.   Subjective:  Care Team: Primary Care Provider: Paseda, Folashade R, FNP ; Next Scheduled Visit: 04/26/2024   Medication Access/Adherence  Current Pharmacy:  [facility] - [place], [place] - [address] [place] [address] Phone: [phone] Fax: [phone]  [facility] - [place], [place] - [address] [place] [address] Phone: [phone] Fax: [phone]  [facility] [address] [place] [address] Phone: [phone] Fax: [phone]   Patient reports affordability concerns with their medications: No  She has medicaid  Patient reports access/transportation concerns to their pharmacy: No  Pharmacy delivers her medication Patient reports adherence concerns with their medications:  Yes  The patient often forgets to take her blood pressure medication several days each week. She stores hydrochlorothiazide  in the closet and has recently purchased a pillbox, placing it near the TV to aid in medication adherence.   Hypertension:  Current medications: hydrochlorothiazide  12.5 mg daily    Patient does not have a validated, automated, upper arm home BP cuff   Patient denies hypotensive s/sx including dizziness, lightheadedness.  Patient reports hypertensive symptoms including headache, pressure over eye (she is not sure if this is related to the medication), intermittent chest pain (but unable to discern from GERD)  Current meal patterns: monitors food now (but occasionally indulge in unhealthy foods) , bake food  Current physical  activity: stopped exercising and plans to restart today    Objective:  Lab Results  Component Value Date   HGBA1C 5.5 06/10/2021    Lab Results  Component Value Date   CREATININE 0.78 12/26/2023   BUN 10 12/26/2023   NA 139 12/26/2023   K 4.7 12/26/2023   CL 103 12/26/2023   CO2 25 12/26/2023    Lab Results  Component Value Date   CHOL 219 (H) 04/11/2023   HDL 61 04/11/2023   LDLCALC 142 (H) 04/11/2023   TRIG 91 04/11/2023   CHOLHDL 3.6 04/11/2023    Medications Reviewed Today     Reviewed by Amedeo Bailiff, RPH (Pharmacist) on 01/09/24 at 1318  Med List Status: <None>   Medication Order Taking? Sig Documenting Provider Last Dose Status Informant  hydrochlorothiazide  (HYDRODIURIL ) 12.5 MG tablet [id number] Yes Take 1 tablet (12.5 mg total) by mouth daily. Paseda, Folashade R, FNP Taking Active   olopatadine  (PATANOL) 0.1 % ophthalmic solution [id number] Yes Apply to eye.  Patient taking differently: Apply 1 drop to eye. PRN   [provider] Taking Active   pantoprazole  (PROTONIX ) 20 MG tablet [id number] Yes Take 1 tablet (20 mg total) by mouth daily. Paseda, Folashade R, FNP Taking Active            Med Note Vallarie Gauze, [facility] R   Tue Jan 09, 2024  1:06 PM) Need refills  Prenatal Vit-Fe Phos-FA-Omega (VITAFOL  GUMMIES) 3.33-0.333-34.8 MG CHEW [id number] Yes Chew 1 tablet by mouth daily. Granville Layer, MD Taking Active               Assessment/Plan:   Hypertension: - Currently uncontrolled due to medication compliance and patient was educated. She reports  that her primary care provider previously discussed weight loss medications with her; however, she declined and preferred to attempt weight loss "naturally" through diet and exercise  - Reviewed long term cardiovascular and renal outcomes of uncontrolled blood pressure - Reviewed appropriate blood pressure monitoring technique and reviewed goal blood pressure. Recommended to check home blood pressure and  heart rate once daily and to record readings - Recommend to continue current medication regimen. Encouraged patient to continue to exercise, monitor diet related to weight loss and HTN, and medication adherence.  - Patient needs prescription refill for hydrochlorothiazide  12.5 mg daily from her pharmacy  - Blood pressure Device: confirmed Epic address [address] in [place], [facility] Medicaid (patient plans to upload insurance card to [employer])  - Will collaborate with PCP on obtaining blood pressure device through DME.   Medication Management:  - The patient requested a refill for pantoprazole ; however, the medication currently has sufficient refills remaining. I attempted to contact [facility] to request refills for both hydrochlorothiazide  and pantoprazole  but was unable to reach the staff. I left a message, though refilling these medications may be premature.    Follow Up Plan: 2 weeks  Alexandria Angel, PharmD Clinical Pharmacist Cell: [phone]

## 2024-01-09 NOTE — Patient Instructions (Signed)
 Ms. Arauz,   Check your blood pressure once daily, and any time you have concerning symptoms like headache, chest pain, dizziness, shortness of breath, or vision changes.   Our goal is less than 130/80.  To appropriately check your blood pressure, make sure you do the following:  1) Avoid caffeine, exercise, or tobacco products for 30 minutes before checking. Empty your bladder. 2) Sit with your back supported in a flat-backed chair. Rest your arm on something flat (arm of the chair, table, etc). 3) Sit still with your feet flat on the floor, resting, for at least 5 minutes.  4) Check your blood pressure. Take 1-2 readings.  5) Write down these readings and bring with you to any provider appointments.  Bring your home blood pressure machine with you to a provider's office for accuracy comparison at least once a year.   Make sure you take your blood pressure medications before you come to any office visit, even if you were asked to fast for labs.  Cephus Shelling, PharmD Clinical Pharmacist Cell: [phone]

## 2024-01-11 ENCOUNTER — Other Ambulatory Visit: Payer: Self-pay

## 2024-01-11 ENCOUNTER — Telehealth: Payer: Self-pay

## 2024-01-11 DIAGNOSIS — Z79899 Other long term (current) drug therapy: Secondary | ICD-10-CM

## 2024-01-11 NOTE — Progress Notes (Signed)
   01/11/2024  Patient ID: Alexandria Fisher, female   DOB: January 22, 1991, 33 y.o.   MRN: [id number]  Attempted to contact patient for medication management/review. Left HIPAA compliant message for patient to return my call at their convenience.   First attempt for patient outreach. I called patient to inform her that the blood pressure cuff can no longer be shipped from DME in [place]. However, [facility] accepts her insurance plan but the patient will need to pick up the device.   Thank you for allowing pharmacy to be a part of this patient's care.  Cephus Shelling, PharmD Clinical Pharmacist Cell: [phone]

## 2024-01-11 NOTE — Progress Notes (Signed)
 I called [address], in [place], and they will accept her insurance plan. She will need to pick up the blood pressure cuf from them as they will not deliver if she is not getting a prescription filled. The prescription order will have to be faxed to [phone] and include the HTN dx code.   [facility] [address] [place], [place] [address] [phone]

## 2024-01-11 NOTE — Progress Notes (Signed)
 Care Coordination Call   Patient called back and was updated that [facility] could dispense. However, [name] states that she cannot drive that far. Encouraged patient to call insurance to find a local DME supplier in her area. I will also try to see if there is another company that takes her insurance.   Cephus Shelling, PharmD Clinical Pharmacist Cell: [phone]

## 2024-01-11 NOTE — Progress Notes (Signed)
 Sent a message to [employer] to se if they would be able to help. KH

## 2024-01-11 NOTE — Progress Notes (Signed)
 Error

## 2024-01-12 ENCOUNTER — Other Ambulatory Visit: Payer: Self-pay

## 2024-01-12 DIAGNOSIS — I10 Essential (primary) hypertension: Secondary | ICD-10-CM

## 2024-01-12 MED ORDER — BLOOD PRESSURE MONITOR 3 DEVI: 1.0000 | 0 refills | Status: AC

## 2024-01-12 NOTE — Progress Notes (Signed)
 Done [facility]

## 2024-01-12 NOTE — Progress Notes (Signed)
 Sending b/p  machine to [employer] [facility] Per Mitzi Davenport and Orthoptist.   Renelda Loma RMA

## 2024-01-15 ENCOUNTER — Other Ambulatory Visit: Payer: Self-pay

## 2024-01-15 NOTE — Progress Notes (Signed)
   01/15/2024 Name: Alexandria Fisher MRN: [id number] DOB: 03/04/1991  Chief Complaint  Patient presents with   Hypertension   Coordination Call I called and spoke with patient about BP machine. She did state that she was able to find someone with her insurance that would cover DME supplies via Medicaid. However, I updated her that [facility] faxed over a document asking for HTN code and it should be approved once the provider submits the dx codes. Apologized for the confusion and back and forth. Patient was understanding.   Thank you for allowing pharmacy to be a part of this patient's care. Cephus Shelling, PharmD Clinical Pharmacist Cell: [phone]

## 2024-01-23 ENCOUNTER — Other Ambulatory Visit: Payer: Self-pay

## 2024-01-23 DIAGNOSIS — I10 Essential (primary) hypertension: Secondary | ICD-10-CM

## 2024-01-23 NOTE — Progress Notes (Signed)
 01/23/2024 Name: Alexandria Fisher MRN: [id number] DOB: 11/22/1990  No chief complaint on file.   Alexandria Fisher is a 33 y.o. year old female who presented for a telephone visit.   They were referred to the pharmacist by their PCP for assistance in managing hypertension.    Subjective:  Care Team: Primary Care Provider: Donell Beers, FNP ; Next Scheduled Visit: 04/26/2024   Medication Access/Adherence  Current Pharmacy:  [facility] - [place], [place] - [address] [place] [address] Phone: [phone] Fax: [phone]  [facility] - [place], [place] - [address] [place] [address] Phone: [phone] Fax: [phone]  [facility] [address] [place] [address] Phone: [phone] Fax: [phone]      Hypertension:  Current medications: hydrochlorothiazide 12.5 mg daily - last dose on Sunday  - She reports compliance with medications using pill organizer and alarm   Patient has a validated, automated, upper arm home BP cuff. She reports that she just received her upper arm blood pressure cuff yesterday.  Current blood pressure readings readings:  -149/85 (yesterday) without medication  Patient denies hypotensive s/sx including dizziness, lightheadedness.  Patient reports hypertensive symptoms including headaches. Encouraged patient to check BP when she has sxs of HA    Objective:  Lab Results  Component Value Date   HGBA1C 5.5 06/10/2021    Lab Results  Component Value Date   CREATININE 0.78 12/26/2023   BUN 10 12/26/2023   NA 139 12/26/2023   K 4.7 12/26/2023   CL 103 12/26/2023   CO2 25 12/26/2023    Lab Results  Component Value Date   CHOL 219 (H) 04/11/2023   HDL 61 04/11/2023   LDLCALC 142 (H) 04/11/2023   TRIG 91 04/11/2023   CHOLHDL 3.6 04/11/2023    Medications Reviewed Today     Reviewed  by Katha Cabal, RPH (Pharmacist) on 01/23/24 at 1440  Med List Status: <None>   Medication Order Taking? Sig Documenting Provider Last Dose Status Informant  Blood Pressure Monitoring (BLOOD PRESSURE MONITOR 3) DEVI [id number] Yes 1 each by Does not apply route as directed. Donell Beers, FNP Taking Active   hydrochlorothiazide (HYDRODIURIL) 12.5 MG tablet [id number] Yes Take 1 tablet (12.5 mg total) by mouth daily. Donell Beers, FNP Taking Active            Med Note Para March, [facility] R   Tue Jan 23, 2024  2:39 PM) Ran out and needs to refill from pharmacy  olopatadine (PATANOL) 0.1 % ophthalmic solution [id number] Yes Apply to eye.  Patient taking differently: Apply 1 drop to eye. PRN   [provider] Taking Active            Med Note Para March, [facility] R   Tue Jan 23, 2024  2:39 PM) Taking 1 drop in both eyes PRN   pantoprazole (PROTONIX) 20 MG tablet [id number] Yes Take 1 tablet (20 mg total) by mouth daily. Donell Beers, FNP Taking Active            Med Note Para March, [facility] R   Tue Jan 09, 2024  1:06 PM) Need refills  Prenatal Vit-Fe Phos-FA-Omega (VITAFOL GUMMIES) 3.33-0.333-34.8 MG CHEW [id number] Yes Chew 1 tablet by mouth daily. Reva Bores, MD Taking Active               Assessment/Plan:   Hypertension: -  Currently uncontrolled. She ran out of medication on Sunday and plans to refill medication. She declines assistance from pharmacist to help facilitate refill.  - Reviewed long term cardiovascular and renal outcomes of uncontrolled blood pressure - Reviewed appropriate blood pressure monitoring technique and reviewed goal blood pressure. Recommended to check home blood pressure and heart rate once daily - Recommend to continue current regimen for now  - Reviewed dietary foods to monitor for sodium     Follow Up Plan: telephone call with PharmD in 4 weeks   Alexandria Angel, PharmD Clinical Pharmacist Cell: [phone]

## 2024-01-23 NOTE — Patient Instructions (Signed)
 Ms. Kham,   It was a pleasure speaking with you today. I am happy the pill organizer and alarm is helping you take your medication daily.   Check your blood pressure once daily, and any time you have concerning symptoms like headache, chest pain, dizziness, shortness of breath, or vision changes.   Our goal is less than 130/80.  To appropriately check your blood pressure, make sure you do the following:  1) Avoid caffeine, exercise, or tobacco products for 30 minutes before checking. Empty your bladder. 2) Sit with your back supported in a flat-backed chair. Rest your arm on something flat (arm of the chair, table, etc). 3) Sit still with your feet flat on the floor, resting, for at least 5 minutes.  4) Check your blood pressure. Take 1-2 readings.  5) Write down these readings and bring with you to any provider appointments.  Bring your home blood pressure machine with you to a provider's office for accuracy comparison at least once a year.   Make sure you take your blood pressure medications before you come to any office visit, even if you were asked to fast for labs.   Alexandria Angel, PharmD Clinical Pharmacist Cell: [phone]

## 2024-02-15 ENCOUNTER — Ambulatory Visit: Payer: Self-pay

## 2024-02-15 ENCOUNTER — Other Ambulatory Visit

## 2024-02-15 DIAGNOSIS — Z111 Encounter for screening for respiratory tuberculosis: Secondary | ICD-10-CM

## 2024-02-19 LAB — QUANTIFERON-TB GOLD PLUS
QuantiFERON Nil Value: 0.04 [IU]/mL
QuantiFERON TB1 Ag Value: 0.05 [IU]/mL
QuantiFERON TB2 Ag Value: 0.04 [IU]/mL

## 2024-02-20 ENCOUNTER — Other Ambulatory Visit: Payer: Self-pay

## 2024-02-20 DIAGNOSIS — Z79899 Other long term (current) drug therapy: Secondary | ICD-10-CM

## 2024-02-20 DIAGNOSIS — I10 Essential (primary) hypertension: Secondary | ICD-10-CM

## 2024-02-20 NOTE — Progress Notes (Signed)
 02/20/2024 Name: [name] MRN: [id number] DOB: 07-27-1991  Chief Complaint  Patient presents with   Hypertension    Alexandria Fisher is a 33 y.o. year old female who presented for a telephone visit.   They were referred to the pharmacist by their PCP for assistance in obtaining blood pressure monitor.    Subjective:  Care Team: Primary Care Provider: Paseda, Folashade R, FNP ; Next Scheduled Visit: 04/26/2024   Medication Access/Adherence  Current Pharmacy:  [facility] - [place], [place] - [address] [place] [address] Phone: [phone] Fax: [phone]  [facility] - [place], [place] - [address] [place] [address] Phone: [phone] Fax: [phone]  [facility] [address] [place] [address] Phone: [phone] Fax: [phone]   Patient reports affordability concerns with their medications: No  Patient reports access/transportation concerns to their pharmacy: No  Patient reports adherence concerns with their medications:  No  But she reports that she was previously had adherence issues but not anymore   Hypertension:  Current medications: hydrochlorothiazide  12.5 mg daily  Previously tried: lisinopril  10 mg    Patient has a validated, automated, upper arm home BP cuff; check BP daily Current blood pressure readings readings:  SBP Average: 135 (with medicine) -140 DBP Average: 85 (with medicine)-106  Patient denies hypotensive s/sx including dizziness, lightheadedness.  Patient reports hypertensive symptoms including headache and she checks blood pressure. The other day she had a headache and checked her BP which was 140/85 mmHg (without medicine) and after taking medicine 126/85 mmHg  Current meal patterns: Reports that she has been drinking more water and eating more veggies. She reports that she avoids fast foods  and high sodium.   Current physical activity: Walks daily and 7 laps at local middle school; her goal is 1 mile per day    Objective:  Lab Results  Component Value Date   HGBA1C 5.5 06/10/2021    Lab Results  Component Value Date   CREATININE 0.78 12/26/2023   BUN 10 12/26/2023   NA 139 12/26/2023   K 4.7 12/26/2023   CL 103 12/26/2023   CO2 25 12/26/2023    Lab Results  Component Value Date   CHOL 219 (H) 04/11/2023   HDL 61 04/11/2023   LDLCALC 142 (H) 04/11/2023   TRIG 91 04/11/2023   CHOLHDL 3.6 04/11/2023    Medications Reviewed Today     Reviewed by Amedeo Bailiff, RPH (Pharmacist) on 02/20/24 at 1520  Med List Status: <None>   Medication Order Taking? Sig Documenting Provider Last Dose Status Informant  Blood Pressure Monitoring (BLOOD PRESSURE MONITOR 3) DEVI [id number] Yes 1 each by Does not apply route as directed. Paseda, Folashade R, FNP Taking Active   hydrochlorothiazide  (HYDRODIURIL ) 12.5 MG tablet [id number] Yes Take 1 tablet (12.5 mg total) by mouth daily. Paseda, Folashade R, FNP Taking Active            Med Note Vallarie Gauze, [facility] R   Tue Jan 23, 2024  2:39 PM) Ran out and needs to refill from pharmacy  olopatadine  (PATANOL) 0.1 % ophthalmic solution [id number] Yes Apply to eye.  Patient taking differently: Apply 1 drop to eye. PRN   [provider] Taking Active            Med Note Vallarie Gauze, [facility] R   Tue Feb 20, 2024  3:20 PM) Taking PR  pantoprazole  (PROTONIX ) 20 MG tablet [id number]  Take 1 tablet (20 mg total) by mouth daily. Paseda, Folashade R, FNP  Active            Med Note Vallarie Gauze, [facility] R   Tue Jan 09, 2024  1:06 PM) Need refills  Prenatal Vit-Fe Phos-FA-Omega (VITAFOL  GUMMIES) 3.33-0.333-34.8 MG CHEW [id number] Yes Chew 1 tablet by mouth daily. Granville Layer, MD Taking Active               Assessment/Plan:   Hypertension: - Currently uncontrolled - Reviewed long term cardiovascular and renal outcomes of uncontrolled  blood pressure - Reviewed appropriate blood pressure monitoring technique and reviewed goal blood pressure. Recommended to check home blood pressure and heart rate daily. Encouraged to record and bring readings to clinic.  - Recommend to continue current regimen. May consider future consideration for assessment of dose adjustment of hydrochlorothiazide  with more BP readings, which was discussed with patient.  - Encouraged patient to continue to exercise and modify her diet.     Follow Up Plan: PharmD telephone encounter in 4-6 weeks   Alexandria Angel, PharmD Clinical Pharmacist Cell: [phone]

## 2024-02-22 ENCOUNTER — Ambulatory Visit: Payer: Self-pay

## 2024-02-22 ENCOUNTER — Telehealth: Payer: Self-pay | Admitting: *Deleted

## 2024-02-22 NOTE — Progress Notes (Signed)
 Complex Care Management Care Guide Note  02/22/2024 Name: Alexandria Fisher MRN: [id number] DOB: 1991-05-12  Alexandria Fisher is a 33 y.o. year old female who is a primary care patient of Paseda, Folashade R, FNP and is actively engaged with the care management team. I reached out to [facility] by phone today to assist with scheduling  with the Pharmacist.  Follow up plan: Unsuccessful telephone outreach attempt made. A HIPAA compliant phone message was left for the patient providing contact information and requesting a return call.   Message Received: 2 days ago Amedeo Bailiff, RPH  Dino Frank, [place],  Could you schedule a follow up appointment for this patient on Layna's schedule? The referral was specifically for BP cuff, but I usually prefer to contact the patient after they should have received the device to make sure everything is okay. Her blood is slightly above goal and will like for her to be followed one more time.  Thanks, Alexandria Angel, PharmD Clinical Pharmacist Cell: [phone]  Barnie Bora  [facility]  [facility], [facility] Guide  Direct Dial: [phone]  Fax [phone]

## 2024-02-27 NOTE — Progress Notes (Unsigned)
 Complex Care Management Care Guide Note  02/27/2024 Name: KURSTIN DIMARZO MRN: [id number] DOB: 1990-12-10  Adelia Adolphus is a 33 y.o. year old female who is a primary care patient of Paseda, Folashade R, FNP and is actively engaged with the care management team. I reached out to [facility] by phone today to assist with scheduling  with the Pharmacist.  Follow up plan: Unsuccessful telephone outreach attempt made. A HIPAA compliant phone message was left for the patient providing contact information and requesting a return call.  Barnie Bora  [facility]  [facility], [facility] Guide  Direct Dial: [phone]  Fax [phone]

## 2024-03-06 NOTE — Progress Notes (Signed)
 Complex Care Management Care Guide Note  03/06/2024 Name: Alexandria Fisher MRN: [id number] DOB: September 25, 1991  Alexandria Fisher is a 33 y.o. year old female who is a primary care patient of Paseda, Folashade R, FNP and is actively engaged with the care management team. I reached out to [facility] by phone today to assist with scheduling  with the Pharmacist.  Follow up plan: Unsuccessful outreach.  No further outreach attempts will be made at this time. We have been unable to contact the patient to reschedule for complex care management services.   Barnie Bora  [facility]  [facility], [facility] Guide  Direct Dial: [phone]  Fax [phone]

## 2024-03-27 ENCOUNTER — Other Ambulatory Visit: Payer: Self-pay | Admitting: Nurse Practitioner

## 2024-03-27 DIAGNOSIS — K219 Gastro-esophageal reflux disease without esophagitis: Secondary | ICD-10-CM

## 2024-03-27 DIAGNOSIS — I10 Essential (primary) hypertension: Secondary | ICD-10-CM

## 2024-04-26 ENCOUNTER — Ambulatory Visit (INDEPENDENT_AMBULATORY_CARE_PROVIDER_SITE_OTHER): Payer: Self-pay | Admitting: Nurse Practitioner

## 2024-04-26 ENCOUNTER — Encounter: Payer: Self-pay | Admitting: Nurse Practitioner

## 2024-04-26 VITALS — BP 129/74 | HR 93 | Temp 97.5°F | Wt 279.0 lb

## 2024-04-26 DIAGNOSIS — Z13228 Encounter for screening for other metabolic disorders: Secondary | ICD-10-CM

## 2024-04-26 DIAGNOSIS — Z6841 Body Mass Index (BMI) 40.0 and over, adult: Secondary | ICD-10-CM

## 2024-04-26 DIAGNOSIS — Z0000 Encounter for general adult medical examination without abnormal findings: Secondary | ICD-10-CM

## 2024-04-26 DIAGNOSIS — E782 Mixed hyperlipidemia: Secondary | ICD-10-CM

## 2024-04-26 DIAGNOSIS — G4700 Insomnia, unspecified: Secondary | ICD-10-CM

## 2024-04-26 DIAGNOSIS — Z130 Encounter for screening for diseases of the blood and blood-forming organs and certain disorders involving the immune mechanism: Secondary | ICD-10-CM

## 2024-04-26 DIAGNOSIS — K219 Gastro-esophageal reflux disease without esophagitis: Secondary | ICD-10-CM | POA: Diagnosis not present

## 2024-04-26 DIAGNOSIS — Z1329 Encounter for screening for other suspected endocrine disorder: Secondary | ICD-10-CM | POA: Diagnosis not present

## 2024-04-26 DIAGNOSIS — M25561 Pain in right knee: Secondary | ICD-10-CM

## 2024-04-26 DIAGNOSIS — I10 Essential (primary) hypertension: Secondary | ICD-10-CM

## 2024-04-26 DIAGNOSIS — Z1321 Encounter for screening for nutritional disorder: Secondary | ICD-10-CM

## 2024-04-26 DIAGNOSIS — H5711 Ocular pain, right eye: Secondary | ICD-10-CM | POA: Insufficient documentation

## 2024-04-26 DIAGNOSIS — G8929 Other chronic pain: Secondary | ICD-10-CM | POA: Insufficient documentation

## 2024-04-26 MED ORDER — DICLOFENAC SODIUM 1 % EX GEL: 4.0000 g | Freq: Four times a day (QID) | CUTANEOUS | 1 refills | Status: AC

## 2024-04-26 MED ORDER — HYDROCHLOROTHIAZIDE 12.5 MG PO TABS: 12.5000 mg | ORAL_TABLET | Freq: Every day | ORAL | 3 refills | Status: AC

## 2024-04-26 NOTE — Patient Instructions (Addendum)
 Please schedule x-ray done at [facility]  1. Annual physical exam (Primary)   2. Screening for endocrine, nutritional, metabolic and immunity disorder  - CBC - Lipid panel  3. Gastroesophageal reflux disease, unspecified whether esophagitis present   4. Chronic pain of right knee  - DG Knee Complete 4 Views Right; Future - diclofenac  Sodium (VOLTAREN ) 1 % GEL; Apply 4 g topically 4 (four) times daily.  Dispense: 100 g; Refill: 1  5. BMI 40.0-44.9, adult (HCC)   6. Primary hypertension  - Basic Metabolic Panel - hydrochlorothiazide  (HYDRODIURIL ) 12.5 MG tablet; Take 1 tablet (12.5 mg total) by mouth daily.  Dispense: 90 tablet; Refill: 3  7. Mixed hyperlipidemia    It is important that you exercise regularly at least 30 minutes 5 times a week as tolerated  Think about what you will eat, plan ahead. Choose  clean, green, fresh or frozen over canned, processed or packaged foods which are more sugary, salty and fatty. 70 to 75% of food eaten should be vegetables and fruit. Three meals at set times with snacks allowed between meals, but they must be fruit or vegetables. Aim to eat over a 12 hour period , example 7 am to 7 pm, and STOP after  your last meal of the day. Drink water,generally about 64 ounces per day, no other drink is as healthy. Fruit juice is best enjoyed in a healthy way, by EATING the fruit.  Thanks for choosing [facility] we consider it a privelige to serve you.

## 2024-04-26 NOTE — Assessment & Plan Note (Signed)
 Take Tylenol  650 mg every 6 hours as needed, application of heat and use of knee brace advised - DG Knee Complete 4 Views Right; Future - diclofenac  Sodium (VOLTAREN ) 1 % GEL; Apply 4 g topically 4 (four) times daily.  Dispense: 100 g; Refill: 1

## 2024-04-26 NOTE — Assessment & Plan Note (Addendum)
 Wt Readings from Last 3 Encounters:  04/26/24 279 lb (126.6 kg)  12/26/23 293 lb 3.2 oz (133 kg)  05/13/23 281 lb (127.5 kg)   Body mass index is 45.03 kg/m.  Patient has lost 14 pounds since last visit,  Patient counseled on low-carb diet, encouraged to engage in regular moderate to vigorous exercise at least [place] 50 minutes weekly as tolerated Benefits of healthy weights discussed Not interested in taking medications but will continue to make lifestyle changes

## 2024-04-26 NOTE — Assessment & Plan Note (Signed)
 Checking lipid panel Avoid fatty fried foods Lose weight Lab Results  Component Value Date   CHOL 219 (H) 04/11/2023   HDL 61 04/11/2023   LDLCALC 142 (H) 04/11/2023   TRIG 91 04/11/2023   CHOLHDL 3.6 04/11/2023

## 2024-04-26 NOTE — Assessment & Plan Note (Signed)
 Annual exam as documented.  Counseling done include healthy lifestyle involving committing to 150 minutes of exercise per week, heart healthy diet, and attaining healthy weight. The importance of adequate sleep also discussed.  Regular use of seat belt and home safety were also discussed . Changes in health habits are decided on by patient with goals and time frames set for achieving them. Immunization and cancer screening  needs are specifically addressed at this visit.    Encouraged to get HPV vaccine Routine fasting labs ordered

## 2024-04-26 NOTE — Progress Notes (Addendum)
 Complete physical exam  Patient: Alexandria Fisher   DOB: 1990-11-08   33 y.o. Female  MRN: [id number]  Subjective:    Chief Complaint  Patient presents with   Annual Exam    fasting    Alexandria Fisher is a 33 y.o. 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  who presents today for a complete physical exam. She reports consuming a general diet. Does walking exercises sometimes  She generally feels well.   Right knee pain.  Patient complains of chronic right knee pain that is worse when walking, pain is currently rated 6/10, not taking any medication for her pain, she denies trauma  Right eye pain patient complains of intermittent right thigh pain for the past 6 months, she denies changes in her vision, eye discharge and does not have pain at this time.     Most recent fall risk assessment:    04/11/2023    8:08 AM  Fall Risk   Falls in the past year? 0  Number falls in past yr: 0  Injury with Fall? 0  Risk for fall due to : No Fall Risks  Follow up Falls evaluation completed     Most recent depression screenings:    04/26/2024   10:31 AM 12/26/2023    9:58 AM  PHQ 2/9 Scores  PHQ - 2 Score 0 0  PHQ- 9 Score 0         Patient Care Team: Naidelyn Parrella R, FNP as PCP - General (Nurse Practitioner)   Outpatient Medications Prior to Visit  Medication Sig   Blood Pressure Monitoring (BLOOD PRESSURE MONITOR 3) DEVI 1 each by Does not apply route as directed.   pantoprazole  (PROTONIX ) 20 MG tablet TAKE ONE TABLET BY MOUTH EVERY DAY   Prenatal Vit-Fe Phos-FA-Omega (VITAFOL  GUMMIES) 3.33-0.333-34.8 MG CHEW Chew 1 tablet by mouth daily.   [DISCONTINUED] hydrochlorothiazide  (HYDRODIURIL ) 12.5 MG tablet Take 1 tablet (12.5 mg total) by mouth daily.   olopatadine  (PATANOL) 0.1 % ophthalmic  solution Apply to eye. (Patient not taking: Reported on 04/26/2024)   No facility-administered medications prior to visit.    Review of Systems  Constitutional:  Negative for appetite change, chills, fatigue and fever.  HENT:  Negative for congestion, postnasal drip, rhinorrhea and sneezing.   Eyes:  Negative for pain, discharge and itching.  Respiratory:  Negative for cough, shortness of breath and wheezing.   Cardiovascular:  Negative for chest pain, palpitations and leg swelling.  Gastrointestinal:  Negative for abdominal pain, constipation, nausea and vomiting.  Endocrine: Negative for polydipsia, polyphagia and polyuria.  Genitourinary:  Negative for difficulty urinating, dysuria, flank pain and frequency.  Musculoskeletal:  Positive for arthralgias. Negative for back pain, joint swelling and myalgias.  Skin:  Negative for color change, pallor, rash and wound.  Allergic/Immunologic: Negative for food allergies and immunocompromised state.  Neurological:  Negative for dizziness, facial asymmetry, weakness, numbness and headaches.  Psychiatric/Behavioral:  Positive for sleep disturbance. Negative for behavioral problems, confusion, self-injury and suicidal ideas.        Objective:     BP 129/74   Pulse 93   Temp (!) 97.5 F (36.4 C)   Wt 279 lb (126.6 kg)   SpO2 100%   BMI 45.03 kg/m    Physical Exam Vitals and nursing note reviewed. Exam conducted with a chaperone present.  Constitutional:  General: She is not in acute distress.    Appearance: Normal appearance. She is obese. She is not ill-appearing, toxic-appearing or diaphoretic.  HENT:     Right Ear: Tympanic membrane, ear canal and external ear normal. There is no impacted cerumen.     Left Ear: Tympanic membrane, ear canal and external ear normal. There is no impacted cerumen.     Nose: Nose normal. No congestion or rhinorrhea.     Mouth/Throat:     Mouth: Mucous membranes are moist.     Pharynx: Oropharynx  is clear. No oropharyngeal exudate or posterior oropharyngeal erythema.  Eyes:     General: No scleral icterus.       Right eye: No discharge.        Left eye: No discharge.     Extraocular Movements: Extraocular movements intact.     Conjunctiva/sclera: Conjunctivae normal.  Neck:     Vascular: No carotid bruit.  Cardiovascular:     Rate and Rhythm: Normal rate and regular rhythm.     Pulses: Normal pulses.     Heart sounds: Normal heart sounds. No murmur heard.    No friction rub. No gallop.  Pulmonary:     Effort: Pulmonary effort is normal. No respiratory distress.     Breath sounds: Normal breath sounds. No stridor. No wheezing, rhonchi or rales.  Chest:     Chest wall: No mass, lacerations, deformity, swelling, tenderness or edema.  Breasts:    Tanner Score is 5.     Right: No swelling, bleeding, inverted nipple, mass, nipple discharge, skin change or tenderness.     Left: No swelling, bleeding, inverted nipple, mass, nipple discharge, skin change or tenderness.  Abdominal:     General: Bowel sounds are normal. There is no distension.     Palpations: Abdomen is soft. There is no mass.     Tenderness: There is no abdominal tenderness. There is no right CVA tenderness, left CVA tenderness, guarding or rebound.     Hernia: No hernia is present.  Musculoskeletal:        General: No swelling, tenderness, deformity or signs of injury.     Cervical back: Normal range of motion and neck supple. No rigidity or tenderness.     Right lower leg: No edema.     Left lower leg: No edema.  Lymphadenopathy:     Cervical: No cervical adenopathy.     Upper Body:     Right upper body: No supraclavicular, axillary or pectoral adenopathy.     Left upper body: No supraclavicular, axillary or pectoral adenopathy.  Skin:    General: Skin is warm and dry.     Capillary Refill: Capillary refill takes less than 2 seconds.     Coloration: Skin is not jaundiced or pale.     Findings: No bruising,  erythema, lesion or rash.  Neurological:     Mental Status: She is alert and oriented to person, place, and time.     Cranial Nerves: No cranial nerve deficit.     Sensory: No sensory deficit.     Motor: No weakness.     Coordination: Coordination normal.     Gait: Gait normal.     Deep Tendon Reflexes: Reflexes normal.  Psychiatric:        Mood and Affect: Mood normal.        Behavior: Behavior normal.        Thought Content: Thought content normal.  Judgment: Judgment normal.     Results for orders placed or performed in visit on 04/26/24  CBC  Result Value Ref Range   WBC 10.2 3.4 - 10.8 x10E3/uL   RBC 5.13 3.77 - 5.28 x10E6/uL   Hemoglobin 12.5 11.1 - 15.9 g/dL   Hematocrit 57.9 65.9 - 46.6 %   MCV 82 79 - 97 fL   MCH 24.4 (L) 26.6 - 33.0 pg   MCHC 29.8 (L) 31.5 - 35.7 g/dL   RDW 84.6 88.2 - 84.5 %   Platelets 270 150 - 450 x10E3/uL  Lipid panel  Result Value Ref Range   Cholesterol, Total 223 (H) 100 - 199 mg/dL   Triglycerides 896 0 - 149 mg/dL   HDL 60 >60 mg/dL   VLDL Cholesterol Cal 18 5 - 40 mg/dL   LDL Chol Calc (NIH) 854 (H) 0 - 99 mg/dL   Chol/HDL Ratio 3.7 0.0 - 4.4 ratio  Basic Metabolic Panel  Result Value Ref Range   Glucose 100 (H) 70 - 99 mg/dL   BUN 11 6 - 20 mg/dL   Creatinine, Ser 9.07 0.57 - 1.00 mg/dL   eGFR 84 >40 fO/fpw/8.26   BUN/Creatinine Ratio 12 9 - 23   Sodium 137 134 - 144 mmol/L   Potassium 4.0 3.5 - 5.2 mmol/L   Chloride 100 96 - 106 mmol/L   CO2 23 20 - 29 mmol/L   Calcium 9.7 8.7 - 10.2 mg/dL       Assessment & Plan:    Routine Health Maintenance and Physical Exam  Immunization History  Administered Date(s) Administered   Hepatitis B 07/14/2003, 08/20/2003   PPD Test 12/04/2019   Tdap 07/15/2011, 06/01/2016, 09/30/2021    Health Maintenance  Topic Date Due   Hepatitis B Vaccines (3 of 3 - 3-dose series) 11/03/2003   HPV VACCINES (1 - 3-dose SCDM series) Never done   Cervical Cancer Screening (HPV/Pap Cotest)   05/08/2028   DTaP/Tdap/Td (4 - Td or Tdap) 10/01/2031   Hepatitis C Screening  Completed   HIV Screening  Completed   Pneumococcal Vaccine 51-77 Years old  Aged Out   Meningococcal B Vaccine  Aged Out   INFLUENZA VACCINE  Discontinued   COVID-19 Vaccine  Discontinued    Discussed health benefits of physical activity, and encouraged her to engage in regular exercise appropriate for her age and condition.  Problem List Items Addressed This Visit       Cardiovascular and Mediastinum   High blood pressure   Controlled on hydrochlorothiazide  12.5 mg daily, continue current medication DASH diet and commitment to daily physical activity for a minimum of 30 minutes discussed and encouraged, as a part of hypertension management. The importance of attaining a healthy weight is also discussed.     04/26/2024   10:28 AM 12/26/2023   10:34 AM 12/26/2023   10:01 AM 12/26/2023    9:56 AM 05/13/2023    5:08 PM 05/13/2023    5:01 PM 05/09/2023    8:10 AM  BP/Weight  Systolic BP 129 129 144 134  143 115  Diastolic BP 74 77 88 103  97 68  Wt. (Lbs) 279   293.2 281  281.6  BMI 45.03 kg/m2   47.32 kg/m2 45.35 kg/m2  45.45 kg/m2           Relevant Medications   hydrochlorothiazide  (HYDRODIURIL ) 12.5 MG tablet   Other Relevant Orders   Basic Metabolic Panel (Completed)     Digestive   Gastroesophageal  reflux disease   Pantoprazole  helping well.  Continue pantoprazole  20 mg daily        Other   BMI 40.0-44.9, adult (HCC)   Wt Readings from Last 3 Encounters:  04/26/24 279 lb (126.6 kg)  12/26/23 293 lb 3.2 oz (133 kg)  05/13/23 281 lb (127.5 kg)   Body mass index is 45.03 kg/m.  Patient has lost 14 pounds since last visit,  Patient counseled on low-carb diet, encouraged to engage in regular moderate to vigorous exercise at least [place] 50 minutes weekly as tolerated Benefits of healthy weights discussed Not interested in taking medications but will continue to make lifestyle changes       Screening for endocrine, nutritional, metabolic and immunity disorder   Relevant Orders   CBC (Completed)   Lipid panel (Completed)   Annual physical exam - Primary   Annual exam as documented.  Counseling done include healthy lifestyle involving committing to 150 minutes of exercise per week, heart healthy diet, and attaining healthy weight. The importance of adequate sleep also discussed.  Regular use of seat belt and home safety were also discussed . Changes in health habits are decided on by patient with goals and time frames set for achieving them. Immunization and cancer screening  needs are specifically addressed at this visit.    Encouraged to get HPV vaccine Routine fasting labs ordered      Mixed hyperlipidemia   Checking lipid panel Avoid fatty fried foods Lose weight Lab Results  Component Value Date   CHOL 219 (H) 04/11/2023   HDL 61 04/11/2023   LDLCALC 142 (H) 04/11/2023   TRIG 91 04/11/2023   CHOLHDL 3.6 04/11/2023         Relevant Medications   hydrochlorothiazide  (HYDRODIURIL ) 12.5 MG tablet   Pain in right eye   Referred to ophthalmologist      Relevant Orders   Ambulatory referral to Ophthalmology   Chronic pain of right knee   Take Tylenol  650 mg every 6 hours as needed, application of heat and use of knee brace advised - DG Knee Complete 4 Views Right; Future - diclofenac  Sodium (VOLTAREN ) 1 % GEL; Apply 4 g topically 4 (four) times daily.  Dispense: 100 g; Refill: 1       Relevant Medications   diclofenac  Sodium (VOLTAREN ) 1 % GEL   Other Relevant Orders   DG Knee Complete 4 Views Right   Insomnia   wakes up gasping for oxygen often, snores and feels tired after 8 hours of sleep.  Home sleep study ordered      Relevant Orders   Home sleep test   Return in about 6 months (around 10/27/2024) for HTN.     Aerik Polan R Ky Rumple, FNP

## 2024-04-26 NOTE — Assessment & Plan Note (Signed)
Referred to ophthalmologist.

## 2024-04-26 NOTE — Assessment & Plan Note (Signed)
 Controlled on hydrochlorothiazide  12.5 mg daily, continue current medication DASH diet and commitment to daily physical activity for a minimum of 30 minutes discussed and encouraged, as a part of hypertension management. The importance of attaining a healthy weight is also discussed.     04/26/2024   10:28 AM 12/26/2023   10:34 AM 12/26/2023   10:01 AM 12/26/2023    9:56 AM 05/13/2023    5:08 PM 05/13/2023    5:01 PM 05/09/2023    8:10 AM  BP/Weight  Systolic BP 129 129 144 134  143 115  Diastolic BP 74 77 88 103  97 68  Wt. (Lbs) 279   293.2 281  281.6  BMI 45.03 kg/m2   47.32 kg/m2 45.35 kg/m2  45.45 kg/m2

## 2024-04-26 NOTE — Assessment & Plan Note (Addendum)
 Pantoprazole  helping well.  Continue pantoprazole  20 mg daily

## 2024-04-27 LAB — BASIC METABOLIC PANEL WITH GFR
BUN/Creatinine Ratio: 12 (ref 9–23)
BUN: 11 mg/dL (ref 6–20)
CO2: 23 mmol/L (ref 20–29)
Calcium: 9.7 mg/dL (ref 8.7–10.2)
Chloride: 100 mmol/L (ref 96–106)
Creatinine, Ser: 0.92 mg/dL (ref 0.57–1.00)
Glucose: 100 mg/dL — ABNORMAL HIGH (ref 70–99)
Potassium: 4 mmol/L (ref 3.5–5.2)
Sodium: 137 mmol/L (ref 134–144)
eGFR: 84 mL/min/1.73 (ref >59)

## 2024-04-27 LAB — CBC
Hematocrit: 42 % (ref 34.0–46.6)
Hemoglobin: 12.5 g/dL (ref 11.1–15.9)
MCH: 24.4 pg — ABNORMAL LOW (ref 26.6–33.0)
MCHC: 29.8 g/dL — ABNORMAL LOW (ref 31.5–35.7)
MCV: 82 fL (ref 79–97)
Platelets: 270 x10E3/uL (ref 150–450)
RBC: 5.13 x10E6/uL (ref 3.77–5.28)
RDW: 15.3 % (ref 11.7–15.4)
WBC: 10.2 x10E3/uL (ref 3.4–10.8)

## 2024-04-27 LAB — LIPID PANEL
Chol/HDL Ratio: 3.7 ratio (ref 0.0–4.4)
Cholesterol, Total: 223 mg/dL — ABNORMAL HIGH (ref 100–199)
HDL: 60 mg/dL (ref >39)
LDL Chol Calc (NIH): 145 mg/dL — ABNORMAL HIGH (ref 0–99)
Triglycerides: 103 mg/dL (ref 0–149)
VLDL Cholesterol Cal: 18 mg/dL (ref 5–40)

## 2024-04-29 ENCOUNTER — Ambulatory Visit: Payer: Self-pay | Admitting: Nurse Practitioner

## 2024-04-30 ENCOUNTER — Telehealth: Payer: Self-pay

## 2024-04-30 NOTE — Telephone Encounter (Signed)
 Copied from CRM [id number]. Topic: Clinical - Request for Lab/Test Order >> Apr 30, 2024  2:45 PM Mia F wrote: Reason for CRM: Pt called in stating she was supposed to be having a sleep study done. Someone was supposed to be getting it authorized by her insurance and she will hear something back the same day. She hasn't heard anything yet so she is now following up. Pt can be reached via My Chart

## 2024-05-01 NOTE — Telephone Encounter (Signed)
 Lvm for pt advising that her reply was sent to the provider and once order is placed it will have to go through prior auth . Once approved pt will be contacted for pick up of sleep study device. Kh

## 2024-05-03 DIAGNOSIS — G4700 Insomnia, unspecified: Secondary | ICD-10-CM | POA: Insufficient documentation

## 2024-05-03 NOTE — Assessment & Plan Note (Signed)
 wakes up gasping for oxygen often, snores and feels tired after 8 hours of sleep.  Home sleep study ordered

## 2024-05-03 NOTE — Addendum Note (Signed)
 Addended by: JUANICE THOMES SAUNDERS on: 05/03/2024 11:55 AM   Modules accepted: Orders

## 2024-05-15 ENCOUNTER — Ambulatory Visit (HOSPITAL_COMMUNITY)
Admission: RE | Admit: 2024-05-15 | Discharge: 2024-05-15 | Disposition: A | Discharge status: Home / Self Care | Source: Ambulatory Visit | Attending: Nurse Practitioner | Admitting: Nurse Practitioner

## 2024-05-15 DIAGNOSIS — G8929 Other chronic pain: Secondary | ICD-10-CM | POA: Diagnosis present

## 2024-05-15 DIAGNOSIS — M25561 Pain in right knee: Secondary | ICD-10-CM | POA: Insufficient documentation

## 2024-07-22 ENCOUNTER — Other Ambulatory Visit: Payer: Self-pay

## 2024-07-22 DIAGNOSIS — H5711 Ocular pain, right eye: Secondary | ICD-10-CM

## 2024-08-01 ENCOUNTER — Other Ambulatory Visit: Payer: Self-pay | Admitting: Nurse Practitioner

## 2024-08-01 DIAGNOSIS — K219 Gastro-esophageal reflux disease without esophagitis: Secondary | ICD-10-CM

## 2024-08-12 ENCOUNTER — Encounter: Payer: Self-pay | Admitting: Nurse Practitioner

## 2024-08-12 ENCOUNTER — Ambulatory Visit (INDEPENDENT_AMBULATORY_CARE_PROVIDER_SITE_OTHER): Admitting: Nurse Practitioner

## 2024-08-12 VITALS — BP 129/76 | HR 91 | Wt 290.0 lb

## 2024-08-12 DIAGNOSIS — K219 Gastro-esophageal reflux disease without esophagitis: Secondary | ICD-10-CM

## 2024-08-12 DIAGNOSIS — I10 Essential (primary) hypertension: Secondary | ICD-10-CM | POA: Diagnosis not present

## 2024-08-12 DIAGNOSIS — N760 Acute vaginitis: Secondary | ICD-10-CM | POA: Diagnosis not present

## 2024-08-12 NOTE — Progress Notes (Signed)
 Acute Office Visit  Subjective:     Patient ID: Alexandria Fisher, female    DOB: Feb 12, 1991, 33 y.o.   MRN: [id number]  Chief Complaint  Patient presents with   Vaginal Itching    Vaginal Itching Pertinent negatives include no abdominal pain, back pain, chills, constipation, dysuria, fever, flank pain, frequency, headaches, nausea, rash or vomiting.   Discussed the use of AI scribe software for clinical note transcription with the patient, who gave verbal consent to proceed.  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  who presents with vaginal itching and intermittent upper epigastric pain.  She has been experiencing vaginal itching for the past couple of weeks, accompanied by a white discharge. She denies any sexual activity for over a year and has not used any medication for this issue.  She reports intermittent sharp pain in the upper epigastric area. This pain has been occurring for a few weeks and is not constant. It sometimes occurs when taking a deep breath. No nausea is present, but she mentions a possible burning sensation. She has been taking acid reflux medication daily, although she occasionally misses doses. The medication is effective when taken consistently.  She attributes her weight to her work schedule and dietary habits, which include occasional consumption of fast food. She is considering meal prepping to improve her diet.    Assessment & Plan     Review of Systems  Constitutional:  Negative for appetite change, chills, fatigue and fever.  HENT:  Negative for congestion, postnasal drip, rhinorrhea and sneezing.   Respiratory:  Negative for cough, shortness of breath and wheezing.   Cardiovascular:  Negative for chest  pain, palpitations and leg swelling.  Gastrointestinal:  Negative for abdominal pain, constipation, nausea and vomiting.  Genitourinary:  Negative for difficulty urinating, dysuria, flank pain and frequency.  Musculoskeletal:  Negative for arthralgias, back pain, joint swelling and myalgias.  Skin:  Negative for color change, pallor, rash and wound.  Neurological:  Negative for dizziness, facial asymmetry, weakness, numbness and headaches.  Psychiatric/Behavioral:  Negative for behavioral problems, confusion, self-injury and suicidal ideas.        Objective:    BP 129/76   Pulse 91   Wt 290 lb (131.5 kg)   SpO2 100%   BMI 46.81 kg/m    Physical Exam Vitals and nursing note reviewed.  Constitutional:      General: She is not in acute distress.    Appearance: Normal appearance. She is obese. She is not ill-appearing, toxic-appearing or diaphoretic.  Eyes:     General: No scleral icterus.       Right eye: No discharge.        Left eye: No discharge.     Extraocular Movements: Extraocular movements intact.     Conjunctiva/sclera: Conjunctivae normal.  Cardiovascular:     Rate and Rhythm: Normal rate and regular rhythm.     Pulses: Normal pulses.     Heart sounds: Normal heart sounds. No murmur heard.    No friction rub. No gallop.  Pulmonary:     Effort: Pulmonary effort is normal. No respiratory distress.     Breath sounds: Normal breath sounds. No stridor. No wheezing, rhonchi or rales.  Chest:     Chest wall: No tenderness.  Abdominal:     General:  There is no distension.     Palpations: Abdomen is soft.     Tenderness: There is no abdominal tenderness. There is no right CVA tenderness, left CVA tenderness or guarding.  Musculoskeletal:        General: No swelling, tenderness, deformity or signs of injury.     Right lower leg: No edema.     Left lower leg: No edema.  Skin:    General: Skin is warm and dry.     Capillary Refill: Capillary refill takes less than 2  seconds.     Coloration: Skin is not jaundiced or pale.     Findings: No bruising, erythema or lesion.  Neurological:     Mental Status: She is alert and oriented to person, place, and time.     Motor: No weakness.     Gait: Gait normal.  Psychiatric:        Mood and Affect: Mood normal.        Behavior: Behavior normal.        Thought Content: Thought content normal.        Judgment: Judgment normal.     No results found for any visits on 08/12/24.      Assessment & Plan:   Problem List Items Addressed This Visit       Cardiovascular and Mediastinum   High blood pressure   Controlled on hydrochlorothiazide  12.5 mg daily Continue current medication DASH diet and commitment to daily physical activity for a minimum of 30 minutes discussed and encouraged, as a part of hypertension management. The importance of attaining a healthy weight is also discussed.     08/12/2024    1:26 PM 04/26/2024   10:28 AM 12/26/2023   10:34 AM 12/26/2023   10:01 AM 12/26/2023    9:56 AM 05/13/2023    5:08 PM 05/13/2023    5:01 PM  BP/Weight  Systolic BP 129 129 129 144 134  143  Diastolic BP 76 74 77 88 103  97  Wt. (Lbs) 290 279   293.2 281   BMI 46.81 kg/m2 45.03 kg/m2   47.32 kg/m2 45.35 kg/m2              Digestive   Gastroesophageal reflux disease   Gastroesophageal reflux disease (GERD) Intermittent epigastric pain likely due to acid reflux. Discussed pantoprazole  side effects and advised as-needed use. - Provide list of foods to avoid: fatty foods, fried foods, caffeinated drinks, milk. - Advise pantoprazole  20 mg daily as needed to minimize side effects. - Encourage weight loss.        Other   Morbid obesity (HCC)   Wt Readings from Last 3 Encounters:  08/12/24 290 lb (131.5 kg)  04/26/24 279 lb (126.6 kg)  12/26/23 293 lb 3.2 oz (133 kg)   Body mass index is 46.81 kg/m.   Discussed lifestyle changes and limited Medicare coverage for weight loss shots. - Encourage meal  prepping to avoid fast food. - Advise diet with 70-80% vegetables and protein, reduce carbohydrates. - Recommend moderate to vigorous exercise 30 minutes, five days a week. - Discussed weight loss surgery as an option, prefers lifestyle changes. Not interested in medication or surgery at this time       Other Visit Diagnoses       Acute vaginitis    -  Primary   Relevant Orders   NuSwab Vaginitis Plus (VG+)       No orders of the defined types were placed in this encounter.  No follow-ups on file.  Barton Want R Laneice Meneely, FNP

## 2024-08-12 NOTE — Assessment & Plan Note (Addendum)
 Wt Readings from Last 3 Encounters:  08/12/24 290 lb (131.5 kg)  04/26/24 279 lb (126.6 kg)  12/26/23 293 lb 3.2 oz (133 kg)   Body mass index is 46.81 kg/m.   Discussed lifestyle changes and limited Medicare coverage for weight loss shots. - Encourage meal prepping to avoid fast food. - Advise diet with 70-80% vegetables and protein, reduce carbohydrates. - Recommend moderate to vigorous exercise 30 minutes, five days a week. - Discussed weight loss surgery as an option, prefers lifestyle changes. Not interested in medication or surgery at this time

## 2024-08-12 NOTE — Assessment & Plan Note (Signed)
 Controlled on hydrochlorothiazide  12.5 mg daily Continue current medication DASH diet and commitment to daily physical activity for a minimum of 30 minutes discussed and encouraged, as a part of hypertension management. The importance of attaining a healthy weight is also discussed.     08/12/2024    1:26 PM 04/26/2024   10:28 AM 12/26/2023   10:34 AM 12/26/2023   10:01 AM 12/26/2023    9:56 AM 05/13/2023    5:08 PM 05/13/2023    5:01 PM  BP/Weight  Systolic BP 129 129 129 144 134  143  Diastolic BP 76 74 77 88 103  97  Wt. (Lbs) 290 279   293.2 281   BMI 46.81 kg/m2 45.03 kg/m2   47.32 kg/m2 45.35 kg/m2

## 2024-08-12 NOTE — Patient Instructions (Signed)

## 2024-08-12 NOTE — Assessment & Plan Note (Signed)
 Gastroesophageal reflux disease (GERD) Intermittent epigastric pain likely due to acid reflux. Discussed pantoprazole  side effects and advised as-needed use. - Provide list of foods to avoid: fatty foods, fried foods, caffeinated drinks, milk. - Advise pantoprazole  20 mg daily as needed to minimize side effects. - Encourage weight loss.

## 2024-08-14 ENCOUNTER — Ambulatory Visit: Payer: Self-pay | Admitting: Nurse Practitioner

## 2024-08-14 LAB — NUSWAB VAGINITIS PLUS (VG+)
Candida albicans, NAA: NEGATIVE
Candida glabrata, NAA: NEGATIVE
Chlamydia trachomatis, NAA: NEGATIVE
Neisseria gonorrhoeae, NAA: NEGATIVE
Trich vag by NAA: NEGATIVE

## 2024-08-21 ENCOUNTER — Other Ambulatory Visit (HOSPITAL_BASED_OUTPATIENT_CLINIC_OR_DEPARTMENT_OTHER): Payer: Self-pay

## 2024-10-28 ENCOUNTER — Ambulatory Visit: Payer: Self-pay | Admitting: Nurse Practitioner

## 2024-11-29 ENCOUNTER — Ambulatory Visit: Admitting: Nurse Practitioner
